# Patient Record
Sex: Female | Born: 1945 | Race: White | Hispanic: No | Marital: Married | Smoking: Never smoker
Health system: Southern US, Community
[De-identification: ages and names within clinical notes are randomized; demographics above are authoritative.]

## PROBLEM LIST (undated history)

## (undated) DIAGNOSIS — M792 Neuralgia and neuritis, unspecified: Secondary | ICD-10-CM

## (undated) DIAGNOSIS — M1990 Unspecified osteoarthritis, unspecified site: Secondary | ICD-10-CM

## (undated) DIAGNOSIS — C649 Malignant neoplasm of unspecified kidney, except renal pelvis: Secondary | ICD-10-CM

## (undated) DIAGNOSIS — K859 Acute pancreatitis without necrosis or infection, unspecified: Secondary | ICD-10-CM

## (undated) DIAGNOSIS — J45909 Unspecified asthma, uncomplicated: Secondary | ICD-10-CM

## (undated) HISTORY — DX: Neuralgia and neuritis, unspecified: M79.2

## (undated) HISTORY — PX: TONSILECTOMY, ADENOIDECTOMY, BILATERAL MYRINGOTOMY AND TUBES: SHX2538

## (undated) HISTORY — PX: HYSTERECTOMY: SHX81

## (undated) HISTORY — DX: Acute pancreatitis without necrosis or infection, unspecified: K85.90

## (undated) HISTORY — DX: Unspecified osteoarthritis, unspecified site: M19.90

## (undated) HISTORY — PX: ABDOMINAL HYSTERECTOMY: SHX81

## (undated) HISTORY — PX: CHOLECYSTECTOMY: SHX55

## (undated) HISTORY — DX: Unspecified asthma, uncomplicated: J45.909

## (undated) HISTORY — DX: Malignant neoplasm of unspecified kidney, except renal pelvis: C64.9

## (undated) HISTORY — PX: APPENDECTOMY: SHX54

---

## 1999-07-27 ENCOUNTER — Emergency Department: Admit: 1999-07-27 | Disposition: A | Discharge status: Home / Self Care | Payer: Self-pay

## 2010-02-09 ENCOUNTER — Inpatient Hospital Stay
Admission: EM | Admit: 2010-02-09 | Disposition: A | Discharge status: Home / Self Care | Payer: Self-pay | Source: Emergency Department | Admitting: Surgery

## 2010-02-12 LAB — URINALYSIS WITH MICROSCOPIC

## 2010-02-12 LAB — COMPREHENSIVE METABOLIC PANEL
ALT: 34 U/L (ref 7–56)
AST (SGOT): 40 U/L (ref 5–40)
Albumin, Synovial: 4.6 g/dL (ref 3.9–5.0)
Alkaline Phosphatase: 126 U/L (ref 38–126)
BUN / Creatinine Ratio: 16 (ref 8–20)
BUN: 11 mg/dL (ref 6–20)
Bilirubin, Total: 0.8 mg/dL (ref 0.2–1.3)
CO2: 24 mmol/L (ref 21.0–31.0)
Calcium: 9.4 mg/dL (ref 8.4–10.2)
Chloride: 98 mmol/L — ABNORMAL LOW (ref 101–111)
Creatinine: 0.71 mg/dL (ref 0.52–1.04)
EGFR: >60 mL/min/{1.73_m2}
EGFR: >60 mL/min/{1.73_m2}
Glucose: 93 mg/dL (ref 70–100)
Potassium: 5.4 mmol/L — ABNORMAL HIGH (ref 3.6–5.0)
Protein, Total: 8.2 g/dL (ref 6.3–8.2)
Sodium: 137 mmol/L (ref 135–145)

## 2010-02-12 LAB — CBC AND DIFFERENTIAL
BASOPHILS %: 0.1 % (ref 0.0–2.0)
Baso(Absolute): 0.03 10*3/uL (ref 0.00–0.20)
Eosinophils %: 0.8 % (ref 0.0–6.0)
Eosinophils Absolute: 0.17 10*3/uL (ref 0.10–0.30)
Hematocrit: 46 % (ref 27.0–49.5)
Hemoglobin: 15.5 g/dL (ref 11.7–15.5)
Immature Granulocytes #: 0.06 10*3/uL — ABNORMAL HIGH (ref 0.00–0.05)
Immature Granulocytes %: 0.3 % — ABNORMAL HIGH (ref 0.0–0.0)
Lymphocytes Absolute: 3.18 10*3/uL (ref 1.00–4.80)
Lymphocytes Relative: 15.5 % — ABNORMAL LOW (ref 25.0–55.0)
MCH: 29.6 pg (ref 27.0–34.0)
MCHC: 33.7 g/dL (ref 32.0–36.0)
MCV: 87.8 fL (ref 80–100)
MPV: 10.7 fL (ref 9.0–13.0)
Monocytes Absolute: 1.62 10*3/uL — ABNORMAL HIGH (ref 0.10–1.20)
Monocytes Relative %: 7.9 % (ref 1.0–8.0)
Neutrophils Absolute: 15.49 10*3/uL — ABNORMAL HIGH (ref 1.80–7.70)
Neutrophils Relative %: 75.7 % — ABNORMAL HIGH (ref 49.0–69.0)
Nucleated RBC %: 0 /100WBC (ref 0.0–0.0)
Nucleted RBC #: 0 10*3/uL (ref 0.00–0.00)
Platelets: 278 10*3/uL (ref 150–400)
RBC: 5.24 M/uL (ref 3.80–5.40)
RDW: 14 % (ref 11.0–14.0)
WBC: 20.49 10*3/uL — ABNORMAL HIGH (ref 4.80–10.80)

## 2010-02-12 LAB — URINALYSIS
Bilirubin, UA: NEGATIVE
Blood, UA: NEGATIVE
Glucose, UA: NEGATIVE
Nitrate: NEGATIVE
Specific Gravity, UR: 1.025 (ref 1.000–1.035)
Urobilinogen, UA: >=8
pH, Urine: 6 (ref 5.0–8.0)

## 2010-02-12 LAB — LIPASE: Lipase: 48 U/L (ref 23–300)

## 2010-02-12 LAB — AMYLASE: Amylase: 40 U/L (ref 29–110)

## 2011-03-02 NOTE — Op Note (Signed)
COSETTE, PRINDLE                                                    MRN:          [id number]                                                          Account:      [id number]                                                     Document ID:  [id number]                                               Procedure Date: 02/09/2010                                                                                    MRN: [id number]  Document ID: [id number]  Admit Date: 02/09/2010     Patient Location: DISCHARGED 02/11/2010  Patient Type: I     SURGEON: Berle Mull MD  ASSISTANT:          PREOPERATIVE DIAGNOSIS:  Acute appendicitis.     POSTOPERATIVE DIAGNOSIS:  Ruptured appendix.     TITLE OF PROCEDURE:  Laparoscopic appendectomy.     ANESTHESIA:  General.     COMPLICATIONS:  None.     BLOOD LOSS:  Minimal     DEEP VENOUS THROMBOSIS PROPHYLAXIS:  Sequential compression devices.     ANTIBIOTIC PROPHYLAXIS:  None needed as the patient was already on therapeutic dosing.     DESCRIPTION OF PROCEDURE:  The patient was taken to the operating room and given general anesthesia  and was prepped and draped in sterile fashion.  An optical 12-mm port was  placed in the umbilicus in the usual fashion and used to create a  pneumoperitoneum.  Under direct vision, two 5-mm low midline ports were  placed, and the appendix was identified and noted to be inflamed and  ruptured, leaking pus.  It was bluntly separated from the surrounding  tissue.  The mesoappendix was divided with the Harmonic scalpel, and a PDS  Endoloop was placed about the base of the appendix.  The appendix was  divided with the Harmonic scalpel and placed within a retrieval bag for  Page 1 of 2  NOU, CHARD                                                    MRN:          [id number]                                                          Account:      [id number]                                                      Document ID:  [id number]                                               Procedure Date: 02/09/2010                                                                                    later removal.  The abdomen was then irrigated out with copious amounts of  saline, suctioned dry, and inspected.  There was no bleeding or further  contamination.  All ports were then removed and the appendix pulled out  through the umbilicus in its bag.  The umbilical fascia was closed with 0  Vicryl and all skin incisions closed with 5-0 Monocryl and Steri-Strips and  injected with Marcaine.  The patient was then awakened and taken to the  recovery room in stable condition, with all counts correct and having  tolerated the procedure well.              D:  02/16/2010 13:02 PM by Berle Mull, MD [phone])  T:  02/17/2010 07:18 AM by       Everlean Cherry: [id number]) (Doc ID: [id number])        cc:                                                                                                            Page 2 of 2  Authenticated by Alric Ran, MD On 03/01/2010 08:10:23 AM

## 2011-03-02 NOTE — Consults (Signed)
Lindsay Elliott, Lindsay Elliott                                                    MRN:          [id number]                                                          Account:      [id number]                                                     Document ID:  [id number]                                               Service Date: 02/09/2010                                                                                    MRN: [id number]  Document ID: [id number]  Admit Date: 02/09/2010     Patient Location: [id number]   Patient Type: I     CONSULTING PHYSICIAN: Berle Mull MD     REFERRING PHYSICIAN: Michaelle Copas MD        REASON FOR CONSULTATION:  Evaluation of abdominal pain.     HISTORY OF PRESENT ILLNESS:  This is a 65 year old female with a 2-day history of initial onset of  nausea and vomiting and malaise, which became a lower abdominal pain  gradually settling in the right lower quadrant.  Yesterday she felt  slightly better, but this morning was feeling worse again with increased  pain.  The pain is worse with movement, associated with nausea, anorexia,  fever of over 101, gradual worsening of her symptoms during the course of  today and slightly relieved by pain medicine.  The pain is constant and  nonradiating and non-migratory.     PAST MEDICAL HISTORY:  Otherwise unremarkable.     PAST SURGICAL HISTORY:  Hysterectomy, tonsillectomy.     SOCIAL HISTORY:  She does not use tobacco or alcohol.     MEDICATIONS:  Takes Tylenol occasionally.     ALLERGIES:    None.     REVIEW OF SYSTEMS:  Negative for HEENT, cardiac, respiratory, psychiatric, neurologic,  dermatologic, musculoskeletal, hematologic, endocrinologic, or psychiatric  issues not already mentioned.  Page 1 of 2  Lindsay Elliott, Lindsay Elliott                                                    MRN:          [id number]                                                           Account:      [id number]                                                     Document ID:  [id number]                                               Service Date: 02/09/2010                                                                                    PHYSICAL EXAMINATION:  GENERAL:  This is a pleasant woman, well-developed, well-nourished,  nontoxic, no distress.  SKIN:  Warm and dry, nondiaphoretic.  HEENT:  Unremarkable.  External eye, ear, nose, mouth.  Normal lids and  conjunctivae, sclerae.  Pupils are EOMI.   NECK:  Without masses, adenopathy, thyromegaly, JVD, or bruit.  HEART:  Regular.  LUNGS:  Clear.  ABDOMEN:  Mildly obese, soft, nondistended.  No palpable masses or  organomegaly or hernias.  There is tenderness with rebound in the right  lower quadrant.  EXTREMITIES:  Without clubbing, cyanosis, or edema.  Good range of motion.   pulses in all 4.  NEUROLOGIC:  She is awake, alert, oriented x3 with normal mood and affect,   judgment, insight, concentration and memory.     DIAGNOSTIC STUDIES::  Fairly unremarkable chemistries, although the potassium is slightly up at  5.4 and chloride is 98.  White count is 20,000.  CT scan demonstrates a  distended inflamed appendix with surrounding edema.     IMPRESSION:  Acute appendicitis.     RECOMMENDATION:  Proceed with laparoscopic appendectomy.  Risks and benefits including  possibly of non-therapeutic surgery, other findings, and rupture were  discussed with the patient and her family and they are interested in  proceeding.              D:  02/09/2010 22:53 PM by Berle Mull, MD [phone])  T:  02/10/2010 06:00 AM by Minda Meo      (Conf: [id number]) (Doc ID: [id number])        cc:  Page 2 of 2  Authenticated by Alric Ran, MD On 02/16/2010 12:51:38 PM

## 2013-03-30 ENCOUNTER — Emergency Department: Payer: Medicare Other

## 2013-03-30 ENCOUNTER — Inpatient Hospital Stay: Payer: Medicare Other | Admitting: Internal Medicine

## 2013-03-30 ENCOUNTER — Inpatient Hospital Stay
Admission: EM | Admit: 2013-03-30 | Discharge: 2013-04-02 | DRG: 418 | Disposition: A | Discharge status: Home / Self Care | Payer: Medicare Other | Attending: Surgery | Admitting: Surgery

## 2013-03-30 DIAGNOSIS — K7689 Other specified diseases of liver: Secondary | ICD-10-CM | POA: Diagnosis present

## 2013-03-30 DIAGNOSIS — Z23 Encounter for immunization: Secondary | ICD-10-CM

## 2013-03-30 DIAGNOSIS — K8000 Calculus of gallbladder with acute cholecystitis without obstruction: Secondary | ICD-10-CM | POA: Diagnosis present

## 2013-03-30 DIAGNOSIS — K859 Acute pancreatitis without necrosis or infection, unspecified: Principal | ICD-10-CM | POA: Diagnosis present

## 2013-03-30 DIAGNOSIS — K821 Hydrops of gallbladder: Secondary | ICD-10-CM | POA: Diagnosis present

## 2013-03-30 LAB — COMPREHENSIVE METABOLIC PANEL
ALT: 256 U/L — ABNORMAL HIGH (ref 0–55)
AST (SGOT): 197 U/L — ABNORMAL HIGH (ref 5–34)
Albumin/Globulin Ratio: 1 (ref 0.9–2.2)
Albumin: 3.9 g/dL (ref 3.5–5.0)
Alkaline Phosphatase: 97 U/L (ref 40–150)
Anion Gap: 13 (ref 5.0–15.0)
BUN: 9.3 mg/dL (ref 7.0–19.0)
Bilirubin, Total: 0.6 mg/dL (ref 0.2–1.2)
CO2: 20 — ABNORMAL LOW (ref 22–29)
Calcium: 9.5 mg/dL (ref 8.5–10.5)
Chloride: 107 (ref 98–107)
Creatinine: 0.8 mg/dL (ref 0.6–1.0)
Globulin: 3.8 g/dL — ABNORMAL HIGH (ref 2.0–3.6)
Glucose: 115 mg/dL — ABNORMAL HIGH (ref 70–100)
Potassium: 4.3 (ref 3.5–5.1)
Protein, Total: 7.7 g/dL (ref 6.0–8.3)
Sodium: 140 (ref 136–145)

## 2013-03-30 LAB — LIPASE: Lipase: 844 U/L — ABNORMAL HIGH (ref 8–78)

## 2013-03-30 LAB — URINALYSIS
Bilirubin, UA: NEGATIVE
Blood, UA: NEGATIVE
Glucose, UA: NEGATIVE
Ketones UA: NEGATIVE
Nitrite, UA: NEGATIVE
Protein, UR: NEGATIVE
Specific Gravity UA: 1.01 (ref 1.001–1.035)
Urine pH: 6 (ref 5.0–8.0)
Urobilinogen, UA: 0.2 mg/dL (ref 0.2–2.0)

## 2013-03-30 LAB — URINE MICROSCOPIC: RBC, UA: NONE SEEN (ref 0–5)

## 2013-03-30 LAB — CBC AND DIFFERENTIAL
Basophils Absolute Automated: 0.01 (ref 0.00–0.20)
Basophils Automated: 0 %
Eosinophils Absolute Automated: 0.1 (ref 0.00–0.70)
Eosinophils Automated: 1 %
Hematocrit: 42.4 % (ref 37.0–47.0)
Hgb: 14.3 g/dL (ref 12.0–16.0)
Immature Granulocytes Absolute: 0.02
Immature Granulocytes: 0 %
Lymphocytes Absolute Automated: 2.43 (ref 0.50–4.40)
Lymphocytes Automated: 21 %
MCH: 29.9 pg (ref 28.0–32.0)
MCHC: 33.7 g/dL (ref 32.0–36.0)
MCV: 88.5 fL (ref 80.0–100.0)
MPV: 10.5 fL (ref 9.4–12.3)
Monocytes Absolute Automated: 0.65 (ref 0.00–1.20)
Monocytes: 6 %
Neutrophils Absolute: 8.41 — ABNORMAL HIGH (ref 1.80–8.10)
Neutrophils: 72 %
Platelets: 291 (ref 140–400)
RBC: 4.79 (ref 4.20–5.40)
RDW: 14 % (ref 12–15)
WBC: 11.6 — ABNORMAL HIGH (ref 3.50–10.80)

## 2013-03-30 LAB — LACTIC ACID, PLASMA: Lactic Acid: 1.4 mmol/L (ref 0.7–2.1)

## 2013-03-30 LAB — GFR: EGFR: >60

## 2013-03-30 MED ORDER — MORPHINE SULFATE 4 MG/ML IJ/IV SOLN (WRAP)
4.0000 mg | Freq: Once | Status: AC
Start: 2013-03-30 — End: 2013-03-30
  Administered 2013-03-30: 4 mg via INTRAVENOUS
  Filled 2013-03-30: qty 1

## 2013-03-30 MED ORDER — IOHEXOL 350 MG/ML IV SOLN
100.0000 mL | Freq: Once | INTRAVENOUS | Status: DC | PRN
Start: 2013-03-30 — End: 2013-03-31

## 2013-03-30 MED ORDER — IOHEXOL 240 MG/ML IJ SOLN
50.0000 mL | Freq: Once | INTRAMUSCULAR | Status: AC
Start: 2013-03-30 — End: 2013-03-30
  Administered 2013-03-30: 50 mL via ORAL

## 2013-03-30 MED ORDER — ONDANSETRON HCL 4 MG/2ML IJ SOLN
4.0000 mg | Freq: Once | INTRAMUSCULAR | Status: AC
Start: 2013-03-30 — End: 2013-03-30
  Administered 2013-03-30: 4 mg via INTRAVENOUS
  Filled 2013-03-30: qty 2

## 2013-03-30 MED ORDER — SODIUM CHLORIDE 0.9 % IV BOLUS
500.0000 mL | Freq: Once | INTRAVENOUS | Status: AC
Start: 2013-03-30 — End: 2013-03-30
  Administered 2013-03-30: 500 mL via INTRAVENOUS

## 2013-03-30 MED ORDER — SODIUM CHLORIDE 0.9 % IV MBP
1.0000 g | Freq: Once | INTRAVENOUS | Status: AC
Start: 2013-03-30 — End: 2013-03-31
  Administered 2013-03-30: 1 g via INTRAVENOUS
  Filled 2013-03-30: qty 1000

## 2013-03-30 NOTE — ED Provider Notes (Signed)
Physician/Midlevel provider first contact with patient: 03/30/13 2026         History     Chief Complaint   Patient presents with   . Abdominal Pain   . Emesis     HPI Comments: Pt with hx of gallstone and appendectomy previously now with nausea and vomiting Q 1 hour since this morning, now with epigastric pain as well.  Pt answers that there are no fevers, chills, diarrhea, constipation, trauma or rash.     no chest pain or sob;      Patient is a 67 y.o. female presenting with abdominal pain and vomiting. The history is provided by the patient.   Abdominal Pain  The primary symptoms of the illness include abdominal pain, nausea and vomiting. The primary symptoms of the illness do not include fever, fatigue, shortness of breath, diarrhea, hematemesis, hematochezia, dysuria, vaginal discharge or vaginal bleeding. The current episode started 6 to 12 hours ago. The onset of the illness was sudden. The problem has not changed since onset.  Symptoms associated with the illness do not include chills, anorexia, diaphoresis, heartburn, constipation, urgency, hematuria, frequency or back pain. Significant associated medical issues include gallstones. Significant associated medical issues do not include PUD, GERD, inflammatory bowel disease, diabetes, diverticulitis or cardiac disease.   Emesis   Associated symptoms include abdominal pain. Pertinent negatives include no arthralgias, no chills, no cough, no diarrhea, no fever and no headaches.       Past Medical History   Diagnosis Date   . Nerve pain      feet, behind eye       Past Surgical History   Procedure Date   . Appendectomy    . Hysterectomy    . Tonsilectomy, adenoidectomy, bilateral myringotomy and tubes    . Laparoscopic, cholecystectomy 04/01/2013     Procedure: LAPAROSCOPIC, CHOLECYSTECTOMY;  Surgeon: Jerel Shepherd, MD;  Location:  MAIN OR;  Service: General;  Laterality: N/A;       History reviewed. No pertinent family history.    Social  History     Substance Use Topics   . Smoking status: Never Smoker    . Smokeless tobacco: Not on file   . Alcohol Use: No       .     No Known Allergies    Discharge Medication List as of 04/02/2013  4:52 PM      CONTINUE these medications which have NOT CHANGED    Details   Cholecalciferol (VITAMIN D) 1000 UNITS capsule Take 1,000 Units by mouth once a week., Until Discontinued, Historical Med      Vitamin B1-B12 5.5-0.0125 MG/5ML Solution Inject into the muscle every 30 (thirty) days., Until Discontinued, Historical Med              Review of Systems   Constitutional: Negative for fever, chills, diaphoresis, activity change and fatigue.   HENT: Negative for ear pain, nosebleeds, rhinorrhea and sore throat.    Eyes: Negative for pain and visual disturbance.   Respiratory: Negative for cough, chest tightness, shortness of breath, wheezing and stridor.    Cardiovascular: Negative for chest pain, palpitations and leg swelling.   Gastrointestinal: Positive for nausea, vomiting and abdominal pain. Negative for heartburn, diarrhea, constipation, hematochezia, abdominal distention, anorexia and hematemesis.   Genitourinary: Negative for dysuria, urgency, frequency, hematuria, flank pain, vaginal bleeding, vaginal discharge, difficulty urinating and pelvic pain.   Musculoskeletal: Negative for arthralgias, back pain, gait problem, joint swelling, neck pain and neck  stiffness.   Skin: Negative for color change, pallor, rash and wound.   Neurological: Negative for dizziness, tremors, seizures, syncope, speech difficulty, weakness, light-headedness and headaches.   Hematological: Does not bruise/bleed easily.   Psychiatric/Behavioral: Negative for confusion.       Physical Exam    BP 126/61  Pulse 71  Temp 99 F (37.2 C) (Temporal Artery)  Resp 18  Ht 1.6 m  Wt 86.637 kg  BMI 33.84 kg/m2  SpO2 93%    Physical Exam   Constitutional: She is oriented to person, place, and time. She appears well-developed and well-nourished. No  distress.   HENT:   Head: Normocephalic and atraumatic. Head is without raccoon's eyes, without Battle's sign, without abrasion and without contusion.   Right Ear: External ear normal.   Left Ear: External ear normal.   Nose: Nose normal.   Mouth/Throat: Oropharynx is clear and moist. No oropharyngeal exudate.   Eyes: Conjunctivae normal and EOM are normal. Pupils are equal, round, and reactive to light. Right eye exhibits no discharge. Left eye exhibits no discharge. No scleral icterus.   Neck: Normal range of motion. Neck supple. Carotid bruit is not present. No rigidity. No tracheal deviation present.   Cardiovascular: Normal rate, regular rhythm, normal heart sounds and intact distal pulses.  Exam reveals no gallop and no friction rub.    No murmur heard.  Pulmonary/Chest: Effort normal and breath sounds normal. No stridor. No respiratory distress. She has no wheezes. She has no rales. She exhibits no tenderness.   Abdominal: Soft. Bowel sounds are normal. She exhibits no distension and no mass. There is tenderness in the epigastric area. There is no rebound, no guarding and no CVA tenderness.       Musculoskeletal: Normal range of motion. She exhibits no edema and no tenderness.   Neurological: She is alert and oriented to person, place, and time. She displays normal reflexes. No cranial nerve deficit or sensory deficit. She exhibits normal muscle tone. She displays no seizure activity. Coordination and gait normal. GCS eye subscore is 4. GCS verbal subscore is 5. GCS motor subscore is 6.   Skin: No abrasion, no bruising and no rash noted. She is not diaphoretic.   Psychiatric: She has a normal mood and affect. Her behavior is normal. Judgment and thought content normal.       MDM and ED Course     ED Medication Orders      Start     Status Ordering Provider    04/01/13 1003   ertapenem [facility]) 1 G in sodium chloride 0.9% 50 mL IVPB mini-bag plus      Comments: Created by cabinet override        Last MAR  action:  Given     03/30/13 2319   ertapenem (INVanz) 1 g in sodium chloride 0.9 % 50 mL IVPB mini-bag plus   Once      Route: Intravenous  Ordered Dose: 1 g         Last MAR action:  Given Ferdinand Cava    03/30/13 2145   sodium chloride 0.9 % bolus 500 mL   Once      Route: Intravenous  Ordered Dose: 500 mL         Last MAR action:  Stopped Elaisha Zahniser JAMES    03/30/13 2037   ondansetron (ZOFRAN) injection 4 mg   Once      Route: Intravenous  Ordered Dose: 4 mg  Last MAR action:  Given Ferdinand Cava    03/30/13 2036   iohexol (OMNIPAQUE) 240 MG/ML IV/ORAL solution 50 mL   Once      Route: Oral  Ordered Dose: 50 mL         Last MAR action:  Given Ferdinand Cava    03/30/13 2036   morphine injection 4 mg   Once      Route: Intravenous  Ordered Dose: 4 mg         Last MAR action:  Given Durante Violett JAMES    03/30/13 2029   sodium chloride 0.9 % bolus 500 mL   Once      Route: Intravenous  Ordered Dose: 500 mL         Last MAR action:  Stopped Chyanne Kohut, Caryl Pina                 MDM  Number of Diagnoses or Management Options  Diagnosis management comments: I, Ferdinand Cava, MD, have been the primary provider for [facility]  during this Emergency Dept visit.    Oxygen saturation by pulse oximetry is 95%-100%, Normal, none needed.    DDx:  VIRAL GASTRO (although no diarrhea yet), partial obstruction vs cholecystitis vs gastrtitis vs other.                      Amount and/or Complexity of Data Reviewed  Clinical lab tests: ordered  Tests in the radiology section of CPT: ordered    Risk of Complications, Morbidity, and/or Mortality  Presenting problems: moderate  Diagnostic procedures: high          Procedures  Results     ** No Results found for the last 24 hours. **          Clinical Impression & Disposition     Clinical Impression  Final diagnoses:   Pancreatitis        ED Disposition     Admit Bed Type: General [8]  Admitting Physician: Dorthula Nettles  [phone]  Patient Class: Inpatient [101]             Discharge Medication List as of 04/02/2013  4:52 PM      START taking these medications    Details   acetaminophen (TYLENOL) 500 MG tablet Take 1 tablet (500 mg total) by mouth every 4 (four) hours as needed., Starting 04/02/2013, Until Discontinued, No Print      HYDROcodone-acetaminophen (NORCO) 5-325 MG per tablet Take 1 tablet by mouth every 4 (four) hours as needed., Starting 04/02/2013, Until Discontinued, Print      ibuprofen (ADVIL,MOTRIN) 400 MG tablet Take 1 tablet (400 mg total) by mouth three times a day after meals., Starting 04/02/2013, Until Discontinued, No Print      polyethylene glycol (MIRALAX) packet Take 17 g by mouth daily., Starting 04/02/2013, Until Discontinued, No Print                     Ferdinand Cava, MD  04/03/13 [phone]

## 2013-03-30 NOTE — ED Notes (Signed)
Per MD- hold bringing pt to CT until results from U/S are back.

## 2013-03-30 NOTE — ED Notes (Signed)
Pt complains of upper abdominal pain that is worse in RUQ of abdomen. Patient states she has been vomiting since 0630 this morning. Patient states she has not had bowel movement today which is abnormal for her. Patient denies urinary complaints and diarrhea. Pt denies recent travel outside of [place] in the past 21 days.

## 2013-03-31 DIAGNOSIS — K859 Acute pancreatitis without necrosis or infection, unspecified: Principal | ICD-10-CM | POA: Diagnosis present

## 2013-03-31 DIAGNOSIS — K851 Biliary acute pancreatitis without necrosis or infection: Secondary | ICD-10-CM

## 2013-03-31 HISTORY — DX: Biliary acute pancreatitis without necrosis or infection: K85.10

## 2013-03-31 LAB — CBC AND DIFFERENTIAL
Basophils Absolute Automated: 0.02 (ref 0.00–0.20)
Basophils Automated: 0 %
Eosinophils Absolute Automated: 0.23 (ref 0.00–0.70)
Eosinophils Automated: 2 %
Hematocrit: 39.5 % (ref 37.0–47.0)
Hgb: 12.9 g/dL (ref 12.0–16.0)
Immature Granulocytes Absolute: 0.02
Immature Granulocytes: 0 %
Lymphocytes Absolute Automated: 2.55 (ref 0.50–4.40)
Lymphocytes Automated: 27 %
MCH: 29.3 pg (ref 28.0–32.0)
MCHC: 32.7 g/dL (ref 32.0–36.0)
MCV: 89.6 fL (ref 80.0–100.0)
MPV: 10.6 fL (ref 9.4–12.3)
Monocytes Absolute Automated: 0.61 (ref 0.00–1.20)
Monocytes: 6 %
Neutrophils Absolute: 6.09 (ref 1.80–8.10)
Neutrophils: 64 %
Platelets: 259 (ref 140–400)
RBC: 4.41 (ref 4.20–5.40)
RDW: 14 % (ref 12–15)
WBC: 9.5 (ref 3.50–10.80)

## 2013-03-31 LAB — COMPREHENSIVE METABOLIC PANEL
ALT: 198 U/L — ABNORMAL HIGH (ref 0–55)
AST (SGOT): 130 U/L — ABNORMAL HIGH (ref 5–34)
Albumin/Globulin Ratio: 1 (ref 0.9–2.2)
Albumin: 3.2 g/dL — ABNORMAL LOW (ref 3.5–5.0)
Alkaline Phosphatase: 86 U/L (ref 40–150)
Anion Gap: 8 (ref 5.0–15.0)
BUN: 6.9 mg/dL — ABNORMAL LOW (ref 7.0–19.0)
Bilirubin, Total: 0.5 mg/dL (ref 0.2–1.2)
CO2: 24 (ref 22–29)
Calcium: 8.6 mg/dL (ref 8.5–10.5)
Chloride: 109 — ABNORMAL HIGH (ref 98–107)
Creatinine: 0.8 mg/dL (ref 0.6–1.0)
Globulin: 3.2 g/dL (ref 2.0–3.6)
Glucose: 103 mg/dL — ABNORMAL HIGH (ref 70–100)
Potassium: 4.1 (ref 3.5–5.1)
Protein, Total: 6.4 g/dL (ref 6.0–8.3)
Sodium: 141 (ref 136–145)

## 2013-03-31 LAB — LIPID PANEL
Cholesterol / HDL Ratio: 4.2
Cholesterol: 159 mg/dL (ref 0–199)
HDL: 38 mg/dL — ABNORMAL LOW (ref >40)
LDL Calculated: 102 mg/dL — ABNORMAL HIGH (ref 0–99)
Triglycerides: 94 mg/dL (ref 34–149)
VLDL Calculated: 19 mg/dL (ref 10–40)

## 2013-03-31 LAB — MAGNESIUM: Magnesium: 1.7 mg/dL (ref 1.6–2.6)

## 2013-03-31 LAB — HEMOLYSIS INDEX: Hemolysis Index: 7 (ref 0–9)

## 2013-03-31 LAB — GFR: EGFR: >60

## 2013-03-31 MED ORDER — MORPHINE SULFATE 2 MG/ML IJ/IV SOLN (WRAP)
2.0000 mg | Status: DC | PRN
Start: 2013-03-31 — End: 2013-03-31
  Administered 2013-03-31: 2 mg via INTRAVENOUS
  Filled 2013-03-31: qty 1

## 2013-03-31 MED ORDER — INFLUENZA VIRUS VACCINE SPLIT IM SUSP
0.5000 mL | Freq: Once | INTRAMUSCULAR | Status: AC
Start: 2013-03-31 — End: 2013-03-31
  Administered 2013-03-31: 0.5 mL via INTRAMUSCULAR
  Filled 2013-03-31: qty 5

## 2013-03-31 MED ORDER — HYDROMORPHONE HCL PF 1 MG/ML IJ SOLN
0.5000 mg | INTRAMUSCULAR | Status: DC | PRN
Start: 2013-03-31 — End: 2013-04-02
  Administered 2013-04-01 – 2013-04-02 (×4): 0.5 mg via INTRAVENOUS
  Filled 2013-03-31 (×4): qty 1

## 2013-03-31 MED ORDER — ENOXAPARIN SODIUM 40 MG/0.4ML SC SOLN
40.0000 mg | Freq: Every day | SUBCUTANEOUS | Status: DC
Start: 2013-03-31 — End: 2013-04-02
  Administered 2013-03-31 – 2013-04-02 (×2): 40 mg via SUBCUTANEOUS
  Filled 2013-03-31 (×2): qty 0.4

## 2013-03-31 MED ORDER — SODIUM CHLORIDE 0.9 % IV SOLN
INTRAVENOUS | Status: DC
Start: 2013-03-31 — End: 2013-04-02

## 2013-03-31 MED ORDER — ACETAMINOPHEN 500 MG PO TABS
500.0000 mg | ORAL_TABLET | ORAL | Status: DC | PRN
Start: 2013-03-31 — End: 2013-04-02
  Administered 2013-03-31 (×2): 500 mg via ORAL
  Filled 2013-03-31 (×2): qty 1

## 2013-03-31 MED ORDER — ONDANSETRON HCL 4 MG/2ML IJ SOLN
4.0000 mg | Freq: Four times a day (QID) | INTRAMUSCULAR | Status: DC | PRN
Start: 2013-03-31 — End: 2013-04-02
  Administered 2013-03-31 – 2013-04-01 (×2): 4 mg via INTRAVENOUS
  Filled 2013-03-31 (×2): qty 2

## 2013-03-31 MED ORDER — FAMOTIDINE 10 MG/ML IV SOLN (WRAP)
20.0000 mg | Freq: Two times a day (BID) | INTRAVENOUS | Status: DC
Start: 2013-03-31 — End: 2013-04-02
  Administered 2013-03-31 – 2013-04-02 (×4): 20 mg via INTRAVENOUS
  Filled 2013-03-31 (×4): qty 2

## 2013-03-31 MED ORDER — IBUPROFEN 400 MG PO TABS
400.0000 mg | ORAL_TABLET | Freq: Four times a day (QID) | ORAL | Status: DC | PRN
Start: 2013-03-31 — End: 2013-03-31
  Administered 2013-03-31: 400 mg via ORAL
  Filled 2013-03-31: qty 1

## 2013-03-31 NOTE — Plan of Care (Signed)
Problem: Health Promotion  Goal: Vaccination Screening  All patients will be screened for current vaccination status on each admission.   Outcome: Completed Date Met:  03/31/13  Patient refused PNA vaccine and has received the flu vaccine by am nurse.  Goal: Risk control - tobacco abuse  Actions to eliminate or reduce tobacco use.   Outcome: Completed Date Met:  03/31/13  Patient is a non-smoker    Problem: Safety  Goal: Patient will be free from injury during hospitalization  Outcome: Progressing  Reviewed fall safety plan. Performing hourly rounding. Call bell within reach.    Problem: Pain  Goal: Patient's pain/discomfort is manageable  Outcome: Progressing  Intervention: Include patient/family/caregiver in decisions related to pain management  Prn pain medication is available upon request.      Problem: Psychosocial and Spiritual Needs  Goal: Demonstrates ability to cope with hospitalization/illness  Outcome: Progressing  Providing a quiet sleep environment.

## 2013-03-31 NOTE — H&P (Signed)
ADMISSION HISTORY AND PHYSICAL EXAM    Date Time: 03/31/2013 4:13 AM  Patient Name: Lindsay Elliott  Attending Physician: Dorthula Nettles, MD    Chief Complaint:   Abdominal pain    History of Present Illness:   Lindsay Elliott is a 67 y.o. female who presents to the hospital with abdominal pain since yesterday morning. Abdominal pain starts in the RUQ area then radiates to epigastric area, sharp, continuous, non accompanied w/ nausea, vomiting, headache. Pt has had 2-3 episodes of nausea and vomiting episode 6 hr after eating for the past 1 month. Pt denies drinking any alcohol prior. Pt denies any recent changes in her medications. Pt denies lightheadedness, fever, chills, chest pain, SOB, cough, diarrhea, blood in stool, melena, urinary symptoms, numbness, weakness.     Past Medical History:     Past Medical History   Diagnosis Date   . Nerve pain      feet, behind eye       Past Surgical History:     Past Surgical History   Procedure Date   . Appendectomy    . Hysterectomy    . Tonsilectomy, adenoidectomy, bilateral myringotomy and tubes        Medications:     Current/Home Medications    CHOLECALCIFEROL (VITAMIN D) 1000 UNITS CAPSULE    Take 1,000 Units by mouth once a week.    VITAMIN B1-B12 5.5-0.0125 MG/5ML SOLUTION    Inject into the muscle every 30 (thirty) days.       Allergies:   No Known Allergies    Social History:     History     Social History   . Marital Status: Married     Spouse Name: N/A     Number of Children: N/A   . Years of Education: N/A     Social History Main Topics   . Smoking status: Never Smoker    . Smokeless tobacco: Not on file   . Alcohol Use: No   . Drug Use: No   . Sexually Active: Not on file     Other Topics Concern   . Not on file     Social History Narrative   . No narrative on file       Family History:   Denies FH of CVA or CAD.     Review of Systems:   Constitutional: Negative for fever, chills, malaise/fatigue and diaphoresis.   HENT: Negative for ear pain,  congestion, sore throat and neck pain.    Eyes: Negative for blurred vision.   Respiratory: Negative for cough, hemoptysis, sputum production, shortness of breath, wheezing and stridor.    Cardiovascular: Negative for chest pain, palpitations, orthopnea, claudication, leg swelling and PND.   Gastrointestinal: Negative for heartburn, nausea, vomiting, abdominal pain, diarrhea, constipation, blood in stool and melena.   Genitourinary: Negative for dysuria, urgency, frequency, hematuria and flank pain.   Musculoskeletal: Negative for back pain and falls.   Skin: Negative for rash.   Neurological: Negative for dizziness, tingling, tremors, sensory change, speech change, focal weakness, seizures, loss of consciousness, weakness and headaches.   Psychiatric/Behavioral: Negative for depression and memory loss. The patient is not nervous/anxious.      Physical Exam:     Filed Vitals:    03/31/13 0020   BP: 163/71   Pulse: 69   Temp: 99 F (37.2 C)   Resp: 16   SpO2: 97%       Constitutional: she is oriented to person, place,  and time. she appears well-developed and well-nourished. No distress.   HEENT: Normocephalic and atraumatic. Conjunctivae are normal. Pupils are equal, round, and reactive to light. No scleral icterus.   Neck: No JVD present. No tracheal deviation present. No thyromegaly present.   Cardiovascular: Normal rate, regular rhythm, normal heart sounds and intact distal pulses.  Exam reveals no gallop. No murmur heard.  Pulmonary/Chest: Effort normal and breath sounds normal. No stridor. No respiratory distress. she has no wheezes. she has no rales. she exhibits no tenderness.   Abdominal: Soft. Bowel sounds are normal. she exhibits no distension and no mass. There is epigastric tenderness. There is no rebound and no guarding.   Musculoskeletal: Normal range of motion. she exhibits no edema.   Lymphadenopathy: she has no cervical adenopathy.   Neurological: she is alert and oriented to person, place, and  time. No cranial nerve deficit. she exhibits no motoric or sensory deficit.   Skin: Skin is warm. No rash noted. she is not diaphoretic.   Psychiatric: she has a normal mood and affect. Thought content normal.       Labs:     Results     Procedure Component Value Units Date/Time    Urinalysis [[id number]]  (Abnormal) Collected:03/30/13 2222    Specimen Information:Urine Updated:03/30/13 2239     Urine Type Clean Catch      Color, UA YELLOW      Clarity, UA SLIGHTLY CLOUDY      Specific Gravity UA 1.010      Urine pH 6.0      Leukocyte Esterase, UA TRACE (A)      Nitrite, UA NEGATIVE      Protein, UR NEGATIVE      Glucose, UA NEGATIVE      Ketones UA NEGATIVE      Urobilinogen, UA 0.2 mg/dL      Bilirubin, UA NEGATIVE      Blood, UA NEGATIVE     Microscopic, Urine [[id number]]  (Abnormal) Collected:03/30/13 2222     RBC, UA None Seen Updated:03/30/13 2239     WBC, UA 11 - 25 (A)      Squamous Epithelial Cells, Urine 0 - 5      Urine Bacteria Few (A)     Comprehensive metabolic panel [[id number]]  (Abnormal) Collected:03/30/13 2039    Specimen Information:Blood Updated:03/30/13 2106     Glucose 115 (H) mg/dL      BUN 9.3 mg/dL      Creatinine 0.8 mg/dL      Sodium 295      Potassium 4.3      Chloride 107      CO2 20 (L)      CALCIUM 9.5 mg/dL      Protein, Total 7.7 g/dL      Albumin 3.9 g/dL      AST (SGOT) 284 (H) U/L      ALT 256 (H) U/L      Alkaline Phosphatase 97 U/L      Bilirubin, Total 0.6 mg/dL      Globulin 3.8 (H) g/dL      Albumin/Globulin Ratio 1.0      Anion Gap 13.0     Lipase [[id number]]  (Abnormal) Collected:03/30/13 2039    Specimen Information:Blood Updated:03/30/13 2106     Lipase 844 (H) U/L     GFR [[id number]] Collected:03/30/13 2039     EGFR >60.0 Updated:03/30/13 2106    Lactic Acid [[id number]] Collected:03/30/13 2039    Specimen Information:Blood Updated:03/30/13 2105  Lactic acid 1.4 mmol/L     CBC with differential [[id number]]  (Abnormal) Collected:03/30/13 2039    Specimen Information:Blood /  Blood Updated:03/30/13 2047     WBC 11.60 (H)      RBC 4.79      Hgb 14.3 g/dL      Hematocrit 54.0 %      MCV 88.5 fL      MCH 29.9 pg      MCHC 33.7 g/dL      RDW 14 %      Platelets 291      MPV 10.5 fL      Neutrophils 72 %      Lymphocytes Automated 21 %      Monocytes 6 %      Eosinophils Automated 1 %      Basophils Automated 0 %      Immature Granulocyte 0 %      Neutrophils Absolute 8.41 (H)      Abs Lymph Automated 2.43      Abs Mono Automated 0.65      Abs Eos Automated 0.10      Absolute Baso Automated 0.01      Absolute Immature Granulocyte 0.02             Rads:     Radiology Results (24 Hour)     Procedure Component Value Units Date/Time    CT Abdomen Pelvis W IV And PO Cont [[id number]] Collected:03/30/13 2307    Order Status:Completed  Updated:03/30/13 2316    Narrative:    Indication: Right upper quadrant pain.    Procedure: Contrast-enhanced CT of the abdomen and pelvis. 100 mL of  Omnipaque 350 were administered intravenously. Oral contrast material is  also present. Axial images were acquired using helical technique.    Comparison: Previous CT February 09, 2010.    Findings: Some calcification is seen in the gallbladder close to the  gallbladder neck, presumably in a gallstone. This was present on  previous CT in 2011. There is no gallbladder wall thickening or  pericholecystic stranding. No dilatation of the biliary tree.  Unremarkable liver, spleen, pancreas, and adrenals. A small  circumscribed lesion seen posteriorly in the mid left kidney is likely a  benign cyst and was present on previous CT. No hydronephrosis. No  urinary tract calculi are seen. There are small amounts of calcified  atherosclerotic plaque. No aneurysm of the abdominal aorta. The appendix  is absent. The uterus is absent. No adnexal masses or cystic lesions are  seen. No colonic diverticula are identified. No evidence of  diverticulitis. No free intraperitoneal gas or free fluid. No dilated  bowel loops or bowel wall  thickening. Normal cardiac size. Included  portions of the lungs are unremarkable.      Impression:    Impression: Cholelithiasis. No acute abnormal findings are identified.    Wynema Birch, MD   03/30/2013 11:12 PM    US Abdomen Limited Ruq [[id number]] Collected:03/30/13 2207    Order Status:Completed  Updated:03/30/13 2221    Narrative:    HISTORY: Right upper quadrant pain, nausea, abdominal liver function  test and elevated lipase.    COMPARISON: CT examination of the abdomen and pelvis dated 02/09/2010.     FINDINGS:  Transverse and sagittal images of the abdomen.    The partially visualized abdominal aorta, IVC, and pancreatic body are  unremarkable. There is mild fatty infiltration of the liver without  apparent focal hepatic lesion. The gallbladder is not  significantly  distended. There is a 1.7 cm echogenic focus with posterior shadowing at  the neck of the gallbladder. No significant gallbladder wall thickening.  No pericholecystic fluid collection is seen. The common bile duct  measures 6.9 mm in its proximal portion.    The right kidney measures 10.3 cm in length. No hydronephrosis  identified. No ascites or sonographic Murphy sign.      Impression:      1.  Cholelithiasis, likely chronic as previously seen on CT dated  02/09/2010. No obvious sonographic evidence of acute cholecystitis  however. If clinical suspicion for acute cholecystitis remains high, a  HIDA scan can be obtained.    Candi Leash, MD   03/30/2013 10:17 PM          Assessment/Plan:   1. Acute pancreatitis w/ cholelithiasis and elevated LFT: keep NPO, IVF hydration, pain control prn, check FLP, repeat labs in AM, possibly due to cholelithiasis, GI consult in AM  2. Leukocytosis: reactive most likely, recheck labs in AM  3. DVT proph: SCD     Signed by: Joaovictor Krone, MD  11/2/20144:13 AM

## 2013-03-31 NOTE — Plan of Care (Signed)
Patient doing ok this am. Pt states pain and nausea improved and denies need for intervention at this time. Pt comfortable resting in bed. Gave pt information on influenza vaccine - pt wants to read over and think about before consenting. Pt started on small sips of clear liquids. IVF infusing. Surgical consult to Dr. Reginia Naas by Dr. Doyne Keel done. Pt steady on her feet & ambulating to bthrm & in room. Will continue to monitor.

## 2013-03-31 NOTE — Plan of Care (Signed)
Problem: Pain  Goal: Patient's pain/discomfort is manageable  Outcome: Progressing  Pain Management Plan    Education about your Pain Management.    Dear Lindsay Elliott,    It is my pleasure to care for you during your hospitalization here at [facility]. We have initiated a new program to educate our patients and/or your family members about your pain management plan.    At [facility], we are committed to providing patients the best patient care possible.  We are seeking every opportunity to meet your unique needs.     Pain is the most common medical problem that requires urgent attention.We understand how difficult it is to have pain and will work with you and your physicians to develop an effective pain management solution.    You are experiencing pain due to your illness  and our goal is to control your pain to a tolerable level where you are able to continue self-care.    This is what you have identified that helps you manage pain at home:  none    We will administer medications to control your pain as prescribed by your provider.  We will offer to provide other comfort measures as needed. We will follow up frequently to assess your pain and the effectiveness of your  pain management plan. We will communicate with your Physicians frequently to change the medications if needed.    Thank you for your time.    Valente David, RN  03/31/2013  1:39 AM  [facility]  [address]  [place], [place]  [address]

## 2013-03-31 NOTE — Consults (Signed)
CONSULTATION    Date Time: 03/31/2013 2:58 PM  Patient Name: Lindsay Elliott, Lindsay Elliott  Requesting Physician: Christel Mormon, MD      Reason for Consultation:   Gallstones, pancreatitis    Assessment:   Cholelithiasis, acute cholecystitis, mild elevation of lipase may be from emesis rather than significant pancreatitis as CT showed normal pancreas without inflammation and the pt's stone is large--1.7cm.    Plan:   Supportive care/pain meds/anti-emetics  Pt eager to proceed with lap chole once lipase is documented as improving  We reviewed the typical procedure=discussed benefits/alternatives/risks  We will check labs in am--pt is on add-on list for lap chole tomorrow.    History:   Amar Keenum is a 67 y.o. female who presents to the hospital on 03/30/2013 with sudden onset of abdominal pain, nausea and intractable emesis which led to ER evaluation.  She has known gallstone for the past 20 yrs which was minimally symptomatic.  No prior pancreatitis or severe pain like this in the past.  She denies fever/chills/sweats/jaundice/wt loss or bowel changes.       Past Medical History:     Past Medical History   Diagnosis Date   . Nerve pain      feet, behind eye       Past Surgical History:     Past Surgical History   Procedure Date   . Appendectomy    . Hysterectomy    . Tonsilectomy, adenoidectomy, bilateral myringotomy and tubes        Family History:   History reviewed. No pertinent family history.    Social History:     History     Social History   . Marital Status: Married     Spouse Name: N/A     Number of Children: N/A   . Years of Education: N/A     Social History Main Topics   . Smoking status: Never Smoker    . Smokeless tobacco: Not on file   . Alcohol Use: No   . Drug Use: No   . Sexually Active: Not on file     Other Topics Concern   . Not on file     Social History Narrative   . No narrative on file       Allergies:   No Known Allergies    Medications:     Current Facility-Administered Medications    Medication Dose Route Frequency   . enoxaparin  40 mg Subcutaneous Daily   . [COMPLETED] ertapenem  1 g Intravenous Once   . famotidine  20 mg Intravenous Q12H SCH   . influenza  0.5 mL Intramuscular Once   . [COMPLETED] iohexol  50 mL Oral Once   . [COMPLETED] morphine  4 mg Intravenous Once   . [COMPLETED] ondansetron  4 mg Intravenous Once   . [COMPLETED] sodium chloride  500 mL Intravenous Once   . [COMPLETED] sodium chloride  500 mL Intravenous Once       Review of Systems:   A comprehensive review of systems was: Negative except see HPI    Physical Exam:     Filed Vitals:    03/31/13 1332   BP: 117/56   Pulse: 54   Temp: 97.7 F (36.5 C)   Resp: 16   SpO2: 95%       Intake and Output Summary (Last 24 hours) at Date Time    Intake/Output Summary (Last 24 hours) at 03/31/13 1458  Last data filed at 03/31/13 1259  Gross per 24 hour   Intake  737.5 ml   Output    920 ml   Net -182.5 ml       General appearance - alert, well appearing, and in mild distress  Mental status - alert, oriented to person, place, and time  Eyes - pupils equal and reactive, extraocular eye movements intact  Neck - supple, no significant adenopathy  Chest - clear to auscultation  Heart - normal rate, regular rhythm  Abdomen-  Moderate tenderness in epigastrium and RUQ, +Murphy's, no mass, no peritoneal signs.  Neurological - alert, oriented, normal speech, no focal findings or movement disorder noted  Musculoskeletal - no joint tenderness, deformity or swelling     Labs Reviewed:     Results     Procedure Component Value Units Date/Time    Lipid panel [[id number]]  (Abnormal) Collected:03/31/13 0626    Specimen Information:Blood Updated:03/31/13 1139     Cholesterol 159 mg/dL      Triglycerides 94 mg/dL      HDL 38 (L) mg/dL      LDL Calculated 409 (H) mg/dL      VLDL Cholesterol Cal 19 mg/dL      CHOL/HDL Ratio 4.2     Hemolysis index [[id number]] Collected:03/31/13 0626     Hemolysis Index 7 Updated:03/31/13 1139    Magnesium  [[id number]] Collected:03/31/13 0626    Specimen Information:Blood Updated:03/31/13 0716     Magnesium 1.7 mg/dL     Comprehensive metabolic panel [[id number]]  (Abnormal) Collected:03/31/13 0626    Specimen Information:Blood Updated:03/31/13 0716     Glucose 103 (H) mg/dL      BUN 6.9 (L) mg/dL      Creatinine 0.8 mg/dL      Sodium 811      Potassium 4.1      Chloride 109 (H)      CO2 24      CALCIUM 8.6 mg/dL      Protein, Total 6.4 g/dL      Albumin 3.2 (L) g/dL      AST (SGOT) 914 (H) U/L      ALT 198 (H) U/L      Alkaline Phosphatase 86 U/L      Bilirubin, Total 0.5 mg/dL      Globulin 3.2 g/dL      Albumin/Globulin Ratio 1.0      Anion Gap 8.0     GFR [[id number]] Collected:03/31/13 0626     EGFR >60.0 Updated:03/31/13 0716    CBC and differential [[id number]] Collected:03/31/13 0626    Specimen Information:Blood / Blood Updated:03/31/13 0652     WBC 9.50      RBC 4.41      Hgb 12.9 g/dL      Hematocrit 08.6 %      MCV 89.6 fL      MCH 29.3 pg      MCHC 32.7 g/dL      RDW 14 %      Platelets 259      MPV 10.6 fL      Neutrophils 64 %      Lymphocytes Automated 27 %      Monocytes 6 %      Eosinophils Automated 2 %      Basophils Automated 0 %      Immature Granulocyte 0 %      Neutrophils Absolute 6.09      Abs Lymph Automated 2.55      Abs Mono Automated 0.61  Abs Eos Automated 0.23      Absolute Baso Automated 0.02      Absolute Immature Granulocyte 0.02     Urinalysis [[id number]]  (Abnormal) Collected:03/30/13 2222    Specimen Information:Urine Updated:03/30/13 2239     Urine Type Clean Catch      Color, UA YELLOW      Clarity, UA SLIGHTLY CLOUDY      Specific Gravity UA 1.010      Urine pH 6.0      Leukocyte Esterase, UA TRACE (A)      Nitrite, UA NEGATIVE      Protein, UR NEGATIVE      Glucose, UA NEGATIVE      Ketones UA NEGATIVE      Urobilinogen, UA 0.2 mg/dL      Bilirubin, UA NEGATIVE      Blood, UA NEGATIVE     Microscopic, Urine [[id number]]  (Abnormal) Collected:03/30/13 2222     RBC, UA None Seen  Updated:03/30/13 2239     WBC, UA 11 - 25 (A)      Squamous Epithelial Cells, Urine 0 - 5      Urine Bacteria Few (A)     Comprehensive metabolic panel [[id number]]  (Abnormal) Collected:03/30/13 2039    Specimen Information:Blood Updated:03/30/13 2106     Glucose 115 (H) mg/dL      BUN 9.3 mg/dL      Creatinine 0.8 mg/dL      Sodium 213      Potassium 4.3      Chloride 107      CO2 20 (L)      CALCIUM 9.5 mg/dL      Protein, Total 7.7 g/dL      Albumin 3.9 g/dL      AST (SGOT) 086 (H) U/L      ALT 256 (H) U/L      Alkaline Phosphatase 97 U/L      Bilirubin, Total 0.6 mg/dL      Globulin 3.8 (H) g/dL      Albumin/Globulin Ratio 1.0      Anion Gap 13.0     Lipase [[id number]]  (Abnormal) Collected:03/30/13 2039    Specimen Information:Blood Updated:03/30/13 2106     Lipase 844 (H) U/L     GFR [[id number]] Collected:03/30/13 2039     EGFR >60.0 Updated:03/30/13 2106    Lactic Acid [[id number]] Collected:03/30/13 2039    Specimen Information:Blood Updated:03/30/13 2105     Lactic acid 1.4 mmol/L     CBC with differential [[id number]]  (Abnormal) Collected:03/30/13 2039    Specimen Information:Blood / Blood Updated:03/30/13 2047     WBC 11.60 (H)      RBC 4.79      Hgb 14.3 g/dL      Hematocrit 47.4 %      MCV 88.5 fL      MCH 29.9 pg      MCHC 33.7 g/dL      RDW 14 %      Platelets 291      MPV 10.5 fL      Neutrophils 72 %      Lymphocytes Automated 21 %      Monocytes 6 %      Eosinophils Automated 1 %      Basophils Automated 0 %      Immature Granulocyte 0 %      Neutrophils Absolute 8.41 (H)      Abs Lymph Automated 2.43      Abs Mono Automated  0.65      Abs Eos Automated 0.10      Absolute Baso Automated 0.01      Absolute Immature Granulocyte 0.02               Rads:   Radiological Procedure reviewed.     Signed by: Jerel Shepherd

## 2013-03-31 NOTE — Progress Notes (Signed)
Hospitalist Addendum: see detail H&P   Patient seen and examined. I have directly reviewed the clinical findings, lab, imaging studies and management of this patient in detail.   CC:  Chief Complaint   Patient presents with   . Abdominal Pain   . Emesis       Physical:  Filed Vitals:    03/31/13 0025 03/31/13 0529 03/31/13 0947 03/31/13 1332   BP:  127/59 113/55 117/56   Pulse:  72 57 54   Temp:  97.2 F (36.2 C) 97.2 F (36.2 C) 97.7 F (36.5 C)   TempSrc:  Temporal Artery Oral Oral   Resp:  18 19 16    Height: 1.6 m (5\' 3" )      Weight: 86.7 kg (191 lb 2.2 oz)      SpO2:  98% 93% 95%     Heart: Normal rate.  S1 normal and S2 normal.    Chest: Symmetric chest wall expansion.   Abdomen: Abdomen is soft, non-distended. Bowel sounds are normal.  There is mild epigastric abdominal tenderness. There is no splenomegaly or hepatomegaly.    Assessment and plan:  Acute pancreatitis--likely due to gallstones. Pt improving. Continue IV fluids, abx and pain meds. Surgery consult called to Dr. Reginia Naas and pt d/w [place] Jansky, M.D.  03/31/2013  3:29 PM

## 2013-04-01 ENCOUNTER — Ambulatory Visit: Payer: Self-pay

## 2013-04-01 ENCOUNTER — Encounter: Payer: Self-pay | Admitting: Certified Registered"

## 2013-04-01 ENCOUNTER — Inpatient Hospital Stay: Payer: Medicare Other | Admitting: Certified Registered"

## 2013-04-01 ENCOUNTER — Encounter
Admission: EM | Disposition: A | Discharge status: Home / Self Care | Payer: Self-pay | Source: Home / Self Care | Attending: Internal Medicine

## 2013-04-01 LAB — ECG 12-LEAD
Atrial Rate: 56 {beats}/min
P Axis: 46 degrees
P-R Interval: 166 ms
Q-T Interval: 432 ms
QRS Duration: 88 ms
QTC Calculation (Bezet): 416 ms
R Axis: 35 degrees
T Axis: 24 degrees
Ventricular Rate: 56 {beats}/min

## 2013-04-01 LAB — CBC AND DIFFERENTIAL
Basophils Absolute Automated: 0.02 (ref 0.00–0.20)
Basophils Automated: 0 %
Eosinophils Absolute Automated: 0.52 (ref 0.00–0.70)
Eosinophils Automated: 7 %
Hematocrit: 37.2 % (ref 37.0–47.0)
Hgb: 12 g/dL (ref 12.0–16.0)
Immature Granulocytes Absolute: 0.01
Immature Granulocytes: 0 %
Lymphocytes Absolute Automated: 2.13 (ref 0.50–4.40)
Lymphocytes Automated: 28 %
MCH: 29.1 pg (ref 28.0–32.0)
MCHC: 32.3 g/dL (ref 32.0–36.0)
MCV: 90.1 fL (ref 80.0–100.0)
MPV: 10.4 fL (ref 9.4–12.3)
Monocytes Absolute Automated: 0.54 (ref 0.00–1.20)
Monocytes: 7 %
Neutrophils Absolute: 4.31 (ref 1.80–8.10)
Neutrophils: 57 %
Platelets: 236 (ref 140–400)
RBC: 4.13 — ABNORMAL LOW (ref 4.20–5.40)
RDW: 14 % (ref 12–15)
WBC: 7.52 (ref 3.50–10.80)

## 2013-04-01 LAB — COMPREHENSIVE METABOLIC PANEL
ALT: 118 U/L — ABNORMAL HIGH (ref 0–55)
AST (SGOT): 47 U/L — ABNORMAL HIGH (ref 5–34)
Albumin/Globulin Ratio: 1.1 (ref 0.9–2.2)
Albumin: 3 g/dL — ABNORMAL LOW (ref 3.5–5.0)
Alkaline Phosphatase: 71 U/L (ref 40–150)
Anion Gap: 7 (ref 5.0–15.0)
BUN: 4.7 mg/dL — ABNORMAL LOW (ref 7.0–19.0)
Bilirubin, Total: 0.4 mg/dL (ref 0.2–1.2)
CO2: 23 (ref 22–29)
Calcium: 8.1 mg/dL — ABNORMAL LOW (ref 8.5–10.5)
Chloride: 113 — ABNORMAL HIGH (ref 98–107)
Creatinine: 0.7 mg/dL (ref 0.6–1.0)
Globulin: 2.8 g/dL (ref 2.0–3.6)
Glucose: 95 mg/dL (ref 70–100)
Potassium: 4.1 (ref 3.5–5.1)
Protein, Total: 5.8 g/dL — ABNORMAL LOW (ref 6.0–8.3)
Sodium: 143 (ref 136–145)

## 2013-04-01 LAB — LIPASE: Lipase: 24 U/L (ref 8–78)

## 2013-04-01 LAB — GFR: EGFR: >60

## 2013-04-01 SURGERY — LAPAROSCOPIC, CHOLECYSTECTOMY
Anesthesia: Anesthesia General | Site: Abdomen | Wound class: Clean Contaminated

## 2013-04-01 MED ORDER — MIDAZOLAM HCL 2 MG/2ML IJ SOLN
INTRAMUSCULAR | Status: AC
Start: 2013-04-01 — End: ?
  Filled 2013-04-01: qty 2

## 2013-04-01 MED ORDER — DEXAMETHASONE SODIUM PHOSPHATE 4 MG/ML IJ SOLN
INTRAMUSCULAR | Status: AC
Start: 2013-04-01 — End: ?
  Filled 2013-04-01: qty 2

## 2013-04-01 MED ORDER — MIDAZOLAM HCL 2 MG/2ML IJ SOLN
INTRAMUSCULAR | Status: DC | PRN
Start: 2013-04-01 — End: 2013-04-01
  Administered 2013-04-01: 2 mg via INTRAVENOUS

## 2013-04-01 MED ORDER — DOCUSATE SODIUM 100 MG PO CAPS
100.0000 mg | ORAL_CAPSULE | Freq: Two times a day (BID) | ORAL | Status: DC
Start: 2013-04-01 — End: 2013-04-02
  Administered 2013-04-01 – 2013-04-02 (×2): 100 mg via ORAL
  Filled 2013-04-01 (×2): qty 1

## 2013-04-01 MED ORDER — KETOROLAC TROMETHAMINE 30 MG/ML IJ SOLN
INTRAMUSCULAR | Status: AC
Start: 2013-04-01 — End: ?
  Filled 2013-04-01: qty 1

## 2013-04-01 MED ORDER — FUROSEMIDE 10 MG/ML IJ SOLN
20.0000 mg | Freq: Once | INTRAMUSCULAR | Status: AC
Start: 2013-04-01 — End: 2013-04-01
  Administered 2013-04-01: 20 mg via INTRAVENOUS
  Filled 2013-04-01: qty 2

## 2013-04-01 MED ORDER — BUPIVACAINE HCL 0.25 % IJ SOLN
INTRAMUSCULAR | Status: DC | PRN
Start: 2013-04-01 — End: 2013-04-01
  Administered 2013-04-01: 30 mL

## 2013-04-01 MED ORDER — ROCURONIUM BROMIDE 50 MG/5ML IV SOLN
INTRAVENOUS | Status: DC | PRN
Start: 2013-04-01 — End: 2013-04-01
  Administered 2013-04-01: 40 mg via INTRAVENOUS

## 2013-04-01 MED ORDER — LACTATED RINGERS IV SOLN
INTRAVENOUS | Status: DC
Start: 2013-04-01 — End: 2013-04-01
  Administered 2013-04-01: 1000 mL via INTRAVENOUS

## 2013-04-01 MED ORDER — METOCLOPRAMIDE HCL 5 MG/ML IJ SOLN
10.0000 mg | Freq: Four times a day (QID) | INTRAMUSCULAR | Status: DC
Start: 2013-04-01 — End: 2013-04-02
  Administered 2013-04-01 – 2013-04-02 (×3): 10 mg via INTRAVENOUS
  Filled 2013-04-01 (×3): qty 2

## 2013-04-01 MED ORDER — LIDOCAINE HCL 2 % IJ SOLN
INTRAMUSCULAR | Status: DC | PRN
Start: 2013-04-01 — End: 2013-04-01
  Administered 2013-04-01: 100 mg

## 2013-04-01 MED ORDER — GLYCOPYRROLATE 0.2 MG/ML IJ SOLN
INTRAMUSCULAR | Status: DC | PRN
Start: 2013-04-01 — End: 2013-04-01
  Administered 2013-04-01: 0.4 mg via INTRAVENOUS

## 2013-04-01 MED ORDER — HYDROCODONE-ACETAMINOPHEN 5-325 MG PO TABS
1.0000 | ORAL_TABLET | ORAL | Status: DC | PRN
Start: 2013-04-01 — End: 2013-04-02

## 2013-04-01 MED ORDER — ONDANSETRON HCL 4 MG/2ML IJ SOLN
INTRAMUSCULAR | Status: AC
Start: 2013-04-01 — End: ?
  Filled 2013-04-01: qty 2

## 2013-04-01 MED ORDER — ROCURONIUM BROMIDE 50 MG/5ML IV SOLN
INTRAVENOUS | Status: AC
Start: 2013-04-01 — End: ?
  Filled 2013-04-01: qty 5

## 2013-04-01 MED ORDER — HYDROMORPHONE HCL PF 1 MG/ML IJ SOLN
0.5000 mg | INTRAMUSCULAR | Status: DC | PRN
Start: 2013-04-01 — End: 2013-04-01
  Administered 2013-04-01: 0.5 mg via INTRAVENOUS

## 2013-04-01 MED ORDER — DEXAMETHASONE SODIUM PHOSPHATE 4 MG/ML IJ SOLN (WRAP)
INTRAMUSCULAR | Status: DC | PRN
Start: 2013-04-01 — End: 2013-04-01
  Administered 2013-04-01: 8 mg via INTRAVENOUS

## 2013-04-01 MED ORDER — PROMETHAZINE HCL 25 MG/ML IJ SOLN
6.2500 mg | Freq: Once | INTRAMUSCULAR | Status: DC | PRN
Start: 2013-04-01 — End: 2013-04-01

## 2013-04-01 MED ORDER — FENTANYL CITRATE 0.05 MG/ML IJ SOLN
INTRAMUSCULAR | Status: DC | PRN
Start: 2013-04-01 — End: 2013-04-01
  Administered 2013-04-01 (×2): 50 ug via INTRAVENOUS
  Administered 2013-04-01: 100 ug via INTRAVENOUS

## 2013-04-01 MED ORDER — ONDANSETRON HCL 4 MG/2ML IJ SOLN
INTRAMUSCULAR | Status: DC | PRN
Start: 2013-04-01 — End: 2013-04-01
  Administered 2013-04-01: 4 mg via INTRAVENOUS

## 2013-04-01 MED ORDER — BUPIVACAINE HCL (PF) 0.25 % IJ SOLN
INTRAMUSCULAR | Status: AC
Start: 2013-04-01 — End: ?
  Filled 2013-04-01: qty 30

## 2013-04-01 MED ORDER — FENTANYL CITRATE 0.05 MG/ML IJ SOLN
INTRAMUSCULAR | Status: AC
Start: 2013-04-01 — End: ?
  Filled 2013-04-01: qty 5

## 2013-04-01 MED ORDER — POLYETHYLENE GLYCOL 3350 17 G PO PACK
17.0000 g | PACK | Freq: Every day | ORAL | Status: DC
Start: 2013-04-01 — End: 2013-04-02
  Administered 2013-04-02: 17 g via ORAL
  Filled 2013-04-01 (×2): qty 1

## 2013-04-01 MED ORDER — PROPOFOL 10 MG/ML IV EMUL
INTRAVENOUS | Status: DC | PRN
Start: 2013-04-01 — End: 2013-04-01
  Administered 2013-04-01: 200 mg via INTRAVENOUS

## 2013-04-01 MED ORDER — PROMETHAZINE HCL 25 MG/ML IJ SOLN
12.5000 mg | Freq: Four times a day (QID) | INTRAMUSCULAR | Status: DC | PRN
Start: 2013-04-01 — End: 2013-04-02
  Administered 2013-04-01: 12.5 mg via INTRAVENOUS
  Filled 2013-04-01: qty 1

## 2013-04-01 MED ORDER — MEPERIDINE HCL 25 MG/ML IJ SOLN
12.5000 mg | Freq: Two times a day (BID) | INTRAMUSCULAR | Status: DC | PRN
Start: 2013-04-01 — End: 2013-04-01

## 2013-04-01 MED ORDER — IBUPROFEN 400 MG PO TABS
400.0000 mg | ORAL_TABLET | Freq: Three times a day (TID) | ORAL | Status: DC
Start: 2013-04-01 — End: 2013-04-02
  Administered 2013-04-01 – 2013-04-02 (×3): 400 mg via ORAL
  Filled 2013-04-01 (×3): qty 1

## 2013-04-01 MED ORDER — METOCLOPRAMIDE HCL 5 MG/ML IJ SOLN
10.0000 mg | Freq: Once | INTRAMUSCULAR | Status: DC | PRN
Start: 2013-04-01 — End: 2013-04-01

## 2013-04-01 MED ORDER — MORPHINE SULFATE 2 MG/ML IJ/IV SOLN (WRAP)
2.0000 mg | Status: DC | PRN
Start: 2013-04-01 — End: 2013-04-01

## 2013-04-01 MED ORDER — PROPOFOL 10 MG/ML IV EMUL
INTRAVENOUS | Status: AC
Start: 2013-04-01 — End: ?
  Filled 2013-04-01: qty 20

## 2013-04-01 MED ORDER — ERTAPENEM 1 GM MBP (CNR)
Status: AC
Start: 2013-04-01 — End: 2013-04-01
  Administered 2013-04-01: 1 g via INTRAVENOUS
  Filled 2013-04-01: qty 50

## 2013-04-01 MED ORDER — NEOSTIGMINE METHYLSULFATE 1 MG/ML IJ SOLN
INTRAMUSCULAR | Status: DC | PRN
Start: 2013-04-01 — End: 2013-04-01
  Administered 2013-04-01: 3 mg via INTRAVENOUS

## 2013-04-01 MED ORDER — LIDOCAINE HCL (PF) 2 % IJ SOLN
INTRAMUSCULAR | Status: AC
Start: 2013-04-01 — End: ?
  Filled 2013-04-01: qty 5

## 2013-04-01 MED ORDER — HYDROMORPHONE HCL PF 1 MG/ML IJ SOLN
INTRAMUSCULAR | Status: AC
Start: 2013-04-01 — End: 2013-04-01
  Administered 2013-04-01: 0.5 mg via INTRAVENOUS
  Filled 2013-04-01: qty 1

## 2013-04-01 SURGICAL SUPPLY — 60 items
APPLCATOR CHLORAPREP 26ML (Prep) ×2 IMPLANT
APPLICATOR ENDOSCOPIC L41 CM NONREFLECTIVE CANNULA CANNULATED STYLET (Hemostat) IMPLANT
APPLICATOR ESCP SS FLSL 5MM 41CM LF STRL (Hemostat)
APPLIER CLIP ENDO ROTATE DISP (Endoscopic Supplies) IMPLANT
APPLIER IN CLP TI MED LG E-CLP III SUP (Staplers) ×1
APPLIER INTERNAL CLIP MEDIUM LARGE L33 (Staplers) ×1
APPLIER INTERNAL CLIP MEDIUM LARGE L33 CM TITANIUM PISTOL GRIP GLARE (Staplers) ×1 IMPLANT
BAG ENDO POUCH (Procedure Accessories) ×2 IMPLANT
BLADE S/SU RIBBACK CARB STL 11 (Blade) ×2 IMPLANT
CANULA STABILITY 5MM (Procedure Accessories) ×6 IMPLANT
CATH CHOLANGIOGRAM 4.5X18IN (Procedure Accessories) IMPLANT
CLIP ENDO 10 MM (Laparoscopy Supplies) IMPLANT
DRAPE C-ARM (Drape) IMPLANT
DRAPE SRG 122X77IN LF STRL LAPSCP FEN (Drape) ×2
DRAPE SURGICAL LAPROSCOPIC FENESTRATION REINFORCED COLLECTION POUCH (Drape) ×1 IMPLANT
DRESSING WND PU COVAD + 2X2IN LF STRL (Dressing) ×8 IMPLANT
ELECTRODE ELECTROSURGICAL J HOOK (Patient Supply) ×1
ELECTRODE ELECTROSURGICAL J HOOK STANDARD L32 CM OD5 MM CONMED EXTEND (Patient Supply) ×1 IMPLANT
ELECTRODE ESURG J HK STD 5MM 32CM STRL (Patient Supply) ×1
GLOVE SRG 6 BGL SNSR LTX STRL PF TXTR (Glove) ×1
GLOVE SURGICAL 6 BIOGEL SENSOR POWDER (Glove) ×1
GLOVE SURGICAL 6 BIOGEL SENSOR POWDER FREE TEXTURE BEAD CUFF STRAW (Glove) ×1 IMPLANT
INHIBITOR ANTI FOG (Procedure Accessories) ×2 IMPLANT
IRRIGATOR SUCTION ERGONOMIC HAND PIECE STRYKEFLOW II (Suction) ×1 IMPLANT
IRRIGATOR SUCTION STRYKEFLOW 2 (Suction) ×1
KIT HEMOSTATIC MALLEABLE APPLICATOR FLOSEAL 13CM MATRIX 5ML (Hemostat) IMPLANT
KIT HMST MTRX 5ML 13CM FLSL MLBL APL (Hemostat)
MANIFOLD NEPTUNE II 4 PORT (Procedure Accessories) ×2 IMPLANT
MASTISOL VIAL 2/3CC STRL (Skin Closure) ×2 IMPLANT
NEEDLE INJ SFTY 22GX1.5IN (Needles) ×2 IMPLANT
NEEDLE INSFL SS 14GA 12CM LTX STRL HFLO (Needles) ×1
NEEDLE INSUFFLATION L12 CM OD14 GA (Needles) ×1
NEEDLE INSUFFLATION L12 CM OD14 GA EXCEL PNEUMOPERITONEUM HIGH FLOW (Needles) ×1 IMPLANT
PAD ELECTROSRG GRND REM W CRD (Procedure Accessories) ×2 IMPLANT
PLUME AWAY ULTRA 6.0 (Procedure Accessories) ×2 IMPLANT
SET SEAL 10-12MM (Laparoscopy Supplies) IMPLANT
SET SEAL 5MM (Laparoscopy Supplies) IMPLANT
SET SEAL AND OBTURATOR 5MM (Laparoscopy Supplies) IMPLANT
SLEEVE CMPR NYL MED THG LGTH SCD EXP LF (Sleeve) ×2
SLEEVE COMPRESSION NYLON MEDIUM THIGH LENGTH KENDALL ADJUSTABLE (Sleeve) ×1 IMPLANT
SLEEVE TROCAR THORACIC 10/12MM (Endoscopic Supplies) ×2 IMPLANT
SOLUTION IRR LR 3L ARTHMTC LF PLS CNTNR (Irrigation Solutions) ×1
SOLUTION IRRIGATION LACTATED RINGERS (Irrigation Solutions) ×1
SOLUTION IRRIGATION LACTATED RINGERS 3000 ML PLASTIC CONTAINER (Irrigation Solutions) ×1 IMPLANT
STOPCOCK IV 4 WAY LARGE BORE ROTATE MALE (IV Supply) ×1
STOPCOCK IV 4 WAY LARGE BORE ROTATE MALE LUER LOCK ADAPTER LIPID (IV Supply) ×1 IMPLANT
STOPCOCK IV LF STRL 4W LG BORE ROT M LL (IV Supply) ×1
STRIP SKIN CLOSURE L4 IN X W1/2 IN (Dressing) ×1
STRIP SKIN CLOSURE L4 IN X W1/2 IN REINFORCE STERI-STRIP POLYESTER (Dressing) ×1 IMPLANT
STRIP SKNCLS PLSTR STRSTRP 4X.5IN LF (Dressing) ×1
SUTURE ABS 4-0 PC5 VCL MTPS 18IN BRD (Suture) ×1
SUTURE COATED VICRYL 4-0 PC-5 L18 IN (Suture) ×1
SUTURE COATED VICRYL 4-0 PC-5 L18 IN BRAID COATED UNDYED ABSORBABLE (Suture) ×1 IMPLANT
SUTURE VICRYL 0 UR6 27IN (Suture) ×2 IMPLANT
SYRINGE LEUR LOK TIP 30 ML (Syringes, Needles) ×2 IMPLANT
TIP SCISSORS 2 ACTION CURVE W5 MM (Endoscopic Supplies) ×1 IMPLANT
TIP SCSR CRV VM METZ 5MM 2 ACT DISP (Endoscopic Supplies) ×1
TRAY MINOR (Pack) ×2 IMPLANT
TUBING INSUFFLATION 10FT (Tubing) ×2 IMPLANT
TUBING SCT IRR (Suction) ×1

## 2013-04-01 NOTE — Anesthesia Postprocedure Evaluation (Signed)
At the conclusion of the procedure, the patient was uneventfully transported to the PACU in stable condition with good ventilatory exchange. Uneventful transition to PACU care.    At this time the patient is awake/easily arousable. The patient's respirations, and cardiovascular status have been evaluated and deemed stable. Post op nausea, vomiting and pain are being evaluated, treated and controlled as effectively as possible without compromising the patients respiratory and cardiovascular status.    Review of input and output information, assessment of cardiovascular course and current physical findings are consistent with adequate hydrational support. Please refer to PACU nursing documentation for confirmation of attainment of normothermia.    There were no anesthetic-related complications evident at this time.    The patient is recovering well and a smooth transition to the next phase of care is anticipated.

## 2013-04-01 NOTE — Plan of Care (Signed)
Pt denied pain/discomfort with exception of nausea prior to cholecystectomy.  Given Zofran with good effect.  Sent to OR at 0945.  Pt returned to unit around 1300 post-laparoscopic cholecystectomy.  Reported nausea on return to unit.  Dr. Doyne Keel notified and pt given Phenergan with good effect.  Pt medicated again for abdominal pain and nausea with Dilaudid and Reglan.  Expiratory wheeze and coarseness noted in lungs.  Dr. Doyne Keel notified and pt's IV fluids decreased and pt given Lasix.  Minor oozing noted at umbilical lap site.  Dr. Reginia Naas aware.  Will continue to monitor.

## 2013-04-01 NOTE — Op Note (Signed)
Laparoscopic Cholecystectomy  Procedure Note    Indications: This patient presents with symptomatic gallbladder disease and will undergo laparoscopic cholecystectomy.    Pre-operative Diagnosis: Cholecytitis/acute cholecystitis    Post-operative Diagnosis: same with hydrops of the gallbladder  Surgeon: Jerel Shepherd  Anesthesia: General endotracheal anesthesia    ASA Class: 2    Procedure Details   The patient was seen again in the Holding Room. The risks, benefits, complications, treatment options, and expected outcomes were discussed with the patient. The possibilities of reaction to medication, pulmonary aspiration, perforation of viscus, bleeding, recurrent infection, finding a normal gallbladder, the need for additional procedures, failure to diagnose a condition, the possible need to convert to an open procedure, and creating a complication requiring transfusion or operation were discussed with the patient. The patient and/or family concurred with the proposed plan, giving informed consent.  The patient was taken to Operating Room, identified as Lindsay Elliott and the procedure verified as Laparoscopic Cholecystectomy. A Time Out was held and the above information confirmed.    Prior to the induction of general anesthesia, antibiotic prophylaxis was administered and an orogastric tube introduced. General endotracheal anesthesia was then administered and tolerated well. After the induction, the abdomen was prepped in the usual sterile fashion. The patient was positioned in the supine position, with reverse Trendelenburg and a roll to the left.    A periumbilical incision was created and Veress needle introduced.   Pneumoperitoneum was then created with  CO2 to 15mm Hg. A 10mm trocar was introduced, then  additional 5mm trocars were introduced under direct vision.     The gallbladder was identified, the fundus grasped and retracted cephalad.  The gallbladder was aspirated to aid in manipulation as it  was tense and distended--clear fluid c/w hydrops was obtained.   Adhesions were lysed bluntly and with the electrocautery where indicated. The pt had a large fatty liver.  There were extensive pericholecystic adhesions and bowel loops were dilated consistent with ileus.   The infundibulum was grasped and retracted laterally, exposing the peritoneum overlying the triangle of Calot.  The cystic duct was clearly identified as well as the cystic artery.     The cystic duct was then triply ligated with surgical clips on the patient side and singly clipped on the gallbladder side and divided. The cystic artery was  ligated with clips and divided as well.     The gallbladder was dissected from the liver bed in retrograde fashion with the electrocautery. The gallbladder was placed into an endoscopic retrieval bad and  removed. The liver bed was irrigated and inspected. Hemostasis was achieved with the electrocautery. Excess irrigant was suctioned.  Local anesthetic of 0.25% Marcaine was infiltrated into trocar sites and trocars were removed.    The fascia at the umbilical site was then closed with a figure of eight suture of 0 Vicryl; the skin was then closed with 4-0 Vicryl and mastisol and steristrips applied.    Instrument, sponge, and needle counts were correct at closure and at the conclusion of the case. The pt was extubated in the operating room and taken to recovery room in stable condition.      Findings:  Cholecystitis/cholelithiasis/hydrops of gallbladder    Estimated Blood Loss: minimal           Drains: none           Specimens: Gallbladder and contents         Complications: no immediate    Alla Sloma Darlene  Leshae Mcclay MD 04/01/2013 11:24 AM

## 2013-04-01 NOTE — Transfer of Care (Signed)
Anesthesia Transfer of Care Note    Patient: Lindsay Elliott    Procedures performed: Procedure(s) with comments:  LAPAROSCOPIC, CHOLECYSTECTOMY    Anesthesia type: General ETT    Patient location:Phase I PACU    Last vitals:   Filed Vitals:    04/01/13 1127   BP: 138/64   Pulse: 65   Temp: 97.2 F (36.2 C)   Resp: 16   SpO2: 97%       Post pain: Patient not complaining of pain, continue current therapy      Mental Status:sedated    Respiratory Function: tolerating nasal cannula    Cardiovascular: stable    Nausea/Vomiting: patient not complaining of nausea or vomiting    Hydration Status: adequate    Post assessment: no apparent anesthetic complications, no reportable events and no evidence of recall

## 2013-04-01 NOTE — Brief Op Note (Signed)
BRIEF OP NOTE    Date Time: 04/01/2013 11:23 AM    Patient Name:   Lindsay Elliott    Date of Operation:    04/01/2013    Providers Performing:   Surgeon(s):  Tanelle Lanzo, Lyndal Pulley, MD    Operative Procedure:   Procedure(s):  LAPAROSCOPIC, CHOLECYSTECTOMY    Preoperative Diagnosis:   Pre-Op Diagnosis Codes:     Cholelithiasis, acute cholecystitis    Postoperative Diagnosis:   Choleycystitis, Cholelithiasis    Anesthesia:   General    Estimated Blood Loss:   25cc    Implants:   * No implants in log *    Drains:   Drains: no    Specimens:        SPECIMENS (last 24 hours)      Pathology Specimens     Row Name 04/01/13 1000             Specimen Information    Specimen Testing Required Routine Pathology     Specimen ID  A     Specimen Description GALLBLADDER AND CONTENTS         Findings:   Hydrops of gallbladder--acute inflammation and extensive pericholecystic adhesions.    Complications:   No immediate.      Signed by: Jerel Shepherd, MD                                                                           [place] [facility]  04/01/2013  11:23 AM

## 2013-04-01 NOTE — PACU (Signed)
1127 pt very sleepy O2 via NC lap sites x 3 dry and intact

## 2013-04-01 NOTE — Anesthesia Preprocedure Evaluation (Addendum)
Anesthesia Evaluation    AIRWAY    Mallampati: III    TM distance: >3 FB  Neck ROM: full  Mouth Opening:limited   CARDIOVASCULAR    cardiovascular exam normal       DENTAL         PULMONARY    pulmonary exam normal     OTHER FINDINGS                      Anesthesia Plan    ASA 2     general               (EKG SB)      intravenous induction   Detailed anesthesia plan: general endotracheal        Post op pain management: per surgeon    informed consent obtained    Plan discussed with CRNA.

## 2013-04-01 NOTE — Progress Notes (Signed)
[facility] Hospitalist Daily Progress Note        Date Time: 04/01/2013  1:28 PM  Patient Name:Lindsay Elliott  QIO:[id number]  PCP: Gregor Hams, MD  Attending Physician:Needham Biggins Doyne Keel M.D.      Chief Complaint:      Chief Complaint   Patient presents with   . Abdominal Pain   . Emesis       Subjective:   Says feeling very nauseated, feels like she is going to throw up. Just back from lap chole    Assessment/Plan     Active Diagnosis: Principal Problem:   *Acute pancreatitis  Active Problems:   Cholelithiasis   Elevated LFTs   Leukocytosis  1. Acute pancreatitis  2. Acute cholecystitis  3.Cholelithiasis  4.Elevated LFT's  5. Leukocytosis  Continue IV fluids, IV pain meds and IV abx. Advance diet slowly as tolerated. Likely home in am if tolerates diet.    DVT Prohylaxis:SEDs   Code Status: Full Code   Disposition: home  Prognosis:Fair  Type of Admission:Inpatient  Estimated Length of Stay (including stay in the ER receiving treatment): 3-4 days  Medical Necessity for stay:pancreatitis    Allergies:   No Known Allergies    Physical Exam:    height is 1.6 m (5\' 3" ) and weight is 86.637 kg (191 lb). Her temporal artery temperature is 97.3 F (36.3 C). Her blood pressure is 148/67 and her pulse is 52. Her respiration is 18 and oxygen saturation is 97%.   Body mass index is 33.84 kg/(m^2).  Filed Vitals:    04/01/13 1200 04/01/13 1210 04/01/13 1220 04/01/13 1230   BP: 152/65 148/66 148/67    Pulse: 54 54 56 52   Temp:       TempSrc:       Resp: 18 18 20 18    Height:       Weight:       SpO2: 97% 97% 96% 97%     Intake and Output Summary (Last 24 hours) at Date Time    Intake/Output Summary (Last 24 hours) at 04/01/13 1328  Last data filed at 04/01/13 1122   Gross per 24 hour   Intake 3990.83 ml   Output      0 ml   Net 3990.83 ml       Constitutional: Patient is oriented to person, place, and time. Patient appears well-developed and well-nourished.   Head: Normocephalic  and atraumatic.  Eyes- pupils equal and reactive, extraocular eye movements intact, sclera anicteric  Ears - external ear canals normal, right ear normal, left ear normal  Nose - normal and patent, no erythema, discharge or polyps and normal nontender sinuses  Mouth - mucous membranes moist, pharynx normal without lesions  Neck: Normal range of motion. Neck supple. No JVD present. No tracheal deviation present. No thyromegaly present.   Cardiovascular: Normal rate, regular rhythm, normal heart sounds and intact distal pulses.  Exam reveals no gallop and no friction rub. No murmur heard.  Pulmonary/Chest: Effort normal and breath sounds normal. No stridor. No respiratory distress. Patient has no wheezes. No rales were present.  Exhibits no tenderness.   Abdominal: Soft. Bowel sounds are normal. Patient exhibits no distension and no mass was palpable. There is no tenderness at incision sites. There is no rebound and no guarding.   Musculoskeletal: Normal range of motion. Patient exhibits no edema and no tenderness.   Lymphadenopathy:  Patient has no cervical adenopathy.   Neurological: Patient is alert and oriented to  person, place, and time and has normal reflexes. No cranial nerve deficit.  Normal muscle tone. Coordination normal.   Skin: Skin is warm. No rash noted. Patient is not diaphoretic. No erythema. No pallor.   Psychiatric: Has normal mood and affect. Behavior is normal. Judgment and thought content normal.    Consult Input/Plan     Plan  None    Medications:     Current Facility-Administered Medications   Medication Dose Route Frequency Last Rate Last Dose   . 0.9%  NaCl infusion   Intravenous Continuous 125 mL/hr at 03/31/13 1332     . acetaminophen (TYLENOL) tablet 500 mg  500 mg Oral Q4H PRN   500 mg at 03/31/13 2010   . docusate sodium (COLACE) capsule 100 mg  100 mg Oral BID       . enoxaparin (LOVENOX) syringe 40 mg  40 mg Subcutaneous Daily   40 mg at 03/31/13 1011   . [COMPLETED] ertapenem [facility])  1 G in sodium chloride 0.9% 50 mL IVPB mini-bag plus       1 g at 04/01/13 1013   . famotidine (PEPCID) injection 20 mg  20 mg Intravenous Q12H SCH   20 mg at 03/31/13 2121   . HYDROcodone-acetaminophen (NORCO) 5-325 MG per tablet 1 tablet  1 tablet Oral Q4H PRN       . HYDROmorphone (DILAUDID) injection 0.5 mg  0.5 mg Intravenous Q2H PRN   0.5 mg at 04/01/13 1610   . ibuprofen (ADVIL,MOTRIN) tablet 400 mg  400 mg Oral TID PC       . [COMPLETED] influenza trivalent-split (FLUZONE/AFLURIA/FLULAVAL) injection 0.5 mL  0.5 mL Intramuscular Once   0.5 mL at 03/31/13 1532   . metoclopramide (REGLAN) injection 10 mg  10 mg Intravenous Q6H SCH       . ondansetron (ZOFRAN) injection 4 mg  4 mg Intravenous Q6H PRN   4 mg at 04/01/13 0739   . polyethylene glycol (MIRALAX) packet 17 g  17 g Oral Daily       . promethazine (PHENERGAN) injection 12.5 mg  12.5 mg Intravenous Q6H PRN       . [DISCONTINUED] bupivacaine (MARCAINE) 0.25 % injection    PRN   30 mL at 04/01/13 1103   . [DISCONTINUED] HYDROmorphone (DILAUDID) injection 0.5 mg  0.5 mg Intravenous Q5 Min PRN   0.5 mg at 04/01/13 1228   . [DISCONTINUED] lactated ringers infusion   Intravenous Continuous   1,000 mL at 04/01/13 1007   . [DISCONTINUED] meperidine (DEMEROL) injection 12.5 mg  12.5 mg Intravenous BID PRN       . [DISCONTINUED] metoclopramide (REGLAN) injection 10 mg  10 mg Intravenous Once PRN       . [DISCONTINUED] morphine injection 2 mg  2 mg Intravenous Q5 Min PRN       . [DISCONTINUED] promethazine (PHENERGAN) injection 6.25 mg  6.25 mg Intravenous Once PRN         Facility-Administered Medications Ordered in Other Encounters   Medication Dose Route Frequency Last Rate Last Dose   . [DISCONTINUED] dexamethasone (DECADRON) injection    PRN   8 mg at 04/01/13 1031   . [DISCONTINUED] fentaNYL (SUBLIMAZE) injection    PRN   50 mcg at 04/01/13 1054   . [DISCONTINUED] glycopyrrolate (ROBINUL) injection    PRN   0.4 mg at 04/01/13 1110   . [DISCONTINUED] lidocaine  (XYLOCAINE) 2 % injection    PRN   100 mg at 04/01/13 1020   . [  DISCONTINUED] midazolam (VERSED) injection    PRN   2 mg at 04/01/13 1011   . [DISCONTINUED] neostigmine (PROSTIGMINE) injection   Intravenous PRN   3 mg at 04/01/13 1110   . [DISCONTINUED] ondansetron (ZOFRAN) injection    PRN   4 mg at 04/01/13 1110   . [DISCONTINUED] propofol (DIPRIVAN) injection    PRN   200 mg at 04/01/13 1020   . [DISCONTINUED] rocuronium (ZEMURON) injection    PRN   40 mg at 04/01/13 1020     Review of Systems:   A comprehensive review of systems has no changes since H&P was obtained except as mentioned in the subjective section.    Labs:     Results     Procedure Component Value Units Date/Time    Lipase [[id number]] Collected:04/01/13 0700    Specimen Information:Blood Updated:04/01/13 0831     Lipase 24 U/L     GFR [[id number]] Collected:04/01/13 0700     EGFR >60.0 Updated:04/01/13 0831    Comprehensive metabolic panel [[id number]]  (Abnormal) Collected:04/01/13 0700    Specimen Information:Blood Updated:04/01/13 0831     Glucose 95 mg/dL      BUN 4.7 (L) mg/dL      Creatinine 0.7 mg/dL      Sodium 865      Potassium 4.1      Chloride 113 (H)      CO2 23      CALCIUM 8.1 (L) mg/dL      Protein, Total 5.8 (L) g/dL      Albumin 3.0 (L) g/dL      AST (SGOT) 47 (H) U/L      ALT 118 (H) U/L      Alkaline Phosphatase 71 U/L      Bilirubin, Total 0.4 mg/dL      Globulin 2.8 g/dL      Albumin/Globulin Ratio 1.1      Anion Gap 7.0     CBC and differential [[id number]]  (Abnormal) Collected:04/01/13 0700    Specimen Information:Blood / Blood Updated:04/01/13 0730     WBC 7.52      RBC 4.13 (L)      Hgb 12.0 g/dL      Hematocrit 28.4 %      MCV 90.1 fL      MCH 29.1 pg      MCHC 32.3 g/dL      RDW 14 %      Platelets 236      MPV 10.4 fL      Neutrophils 57 %      Lymphocytes Automated 28 %      Monocytes 7 %      Eosinophils Automated 7 %      Basophils Automated 0 %      Immature Granulocyte 0 %      Neutrophils Absolute 4.31      Abs  Lymph Automated 2.13      Abs Mono Automated 0.54      Abs Eos Automated 0.52      Absolute Baso Automated 0.02      Absolute Immature Granulocyte 0.01           Rads:   Radiological Procedure reviewed.  Radiology Results (24 Hour)     ** No Results found for the last 24 hours. **            Time spent for evaluation, management and coordination of care:   :  Signed by: Christel Mormon  04/01/2013 1:28 PM

## 2013-04-02 MED ORDER — PROMETHAZINE HCL 25 MG PO TABS
12.5000 mg | ORAL_TABLET | Freq: Four times a day (QID) | ORAL | Status: DC | PRN
Start: 2013-04-02 — End: 2013-04-02

## 2013-04-02 MED ORDER — IBUPROFEN 400 MG PO TABS
400.0000 mg | ORAL_TABLET | Freq: Three times a day (TID) | ORAL | Status: DC
Start: 2013-04-02 — End: 2013-11-28

## 2013-04-02 MED ORDER — PROMETHAZINE HCL 12.5 MG PO TABS
12.5000 mg | ORAL_TABLET | Freq: Four times a day (QID) | ORAL | Status: DC | PRN
Start: 2013-04-02 — End: 2013-11-28

## 2013-04-02 MED ORDER — POLYETHYLENE GLYCOL 3350 17 G PO PACK
17.0000 g | PACK | Freq: Every day | ORAL | Status: DC
Start: 2013-04-02 — End: 2013-11-28

## 2013-04-02 MED ORDER — HYDROCODONE-ACETAMINOPHEN 5-325 MG PO TABS
1.0000 | ORAL_TABLET | ORAL | Status: DC | PRN
Start: 2013-04-02 — End: 2013-11-28

## 2013-04-02 MED ORDER — ACETAMINOPHEN 500 MG PO TABS
500.0000 mg | ORAL_TABLET | ORAL | Status: DC | PRN
Start: 2013-04-02 — End: 2017-03-28

## 2013-04-02 NOTE — Plan of Care (Signed)
Problem: Safety  Goal: Patient will be free from injury during hospitalization  Outcome: Progressing  Intervention: Provide and maintain safe environment  Call bell and all possessions within reach.   Intervention: Hourly rounding.  On going.       Problem: Pain  Goal: Patient's pain/discomfort is manageable  Outcome: Completed Date Met:  04/02/13  Pain management per care plan.     Problem: Pain interferes with ability to perform ADL  Goal: Pain at adequate level as identified by patient  Outcome: Progressing  Pt complaining of mild to moderate abdominal pain and nausea. PRN medications with good effect. Comfort function goal identified and met.

## 2013-04-02 NOTE — Discharge Summary (Signed)
[facility] Hospitalist Discharge Note      Date Time: 04/02/2013  3:05 PM  Patient Name:Lindsay Elliott  VWU:[id number]  PCP: Gregor Hams, MD  Attending Physician:Bowden Boody Roosvelt Harps M.D.    Hospital Course:   Please see H&P for complete details of HPI and ROS. The patient was admitted to [facility] and has been diagnosed with the following conditions and has been taken care as mentioned below.    Patient Active Problem List    Diagnosis Date Noted   . Acute pancreatitis 03/31/2013   . Symptomatic cholelithiasis 03/31/2013     S/P Lap Chole done by Dr. Reginia Naas.     . Elevated LFTs 03/31/2013     S/P lap Chole by Dr. Annamary Carolin.on 11/3 was stable clinically on 11/4 to be dicharged home with norco for outpatient pain management.  Type Of Admission: inpatient    Medical Necessity for stay:acute chole with elevated lipase    Date of Admission:   03/30/2013    Date of Discharge:   04/02/2013    Chief Complaint:      Chief Complaint   Patient presents with   . Abdominal Pain   . Emesis       Discharge Diagnosis:   Hospital Problems:  Principal Problem:   *Acute pancreatitis  Active Problems:   Symptomatic cholelithiasis   Elevated LFTs      Lists the present on admission hospital problems  Present on Admission:   . Acute pancreatitis  . Symptomatic cholelithiasis  . Elevated LFTs  . (Resolved) Leukocytosis        Consult Input/Plan     None    Procedures performed:   none    Physical Exam:    height is 1.6 m (5\' 3" ) and weight is 86.637 kg (191 lb). Her temporal artery temperature is 99 F (37.2 C). Her blood pressure is 126/61 and her pulse is 71. Her respiration is 18 and oxygen saturation is 93%.   Body mass index is 33.84 kg/(m^2).  Filed Vitals:    04/02/13 0047 04/02/13 0542 04/02/13 1013 04/02/13 1417   BP: 127/58 126/61 146/66 126/61   Pulse: 55 55 89 71   Temp: 98.6 F (37 C) 97.7 F (36.5 C) 98.6 F (37 C) 99 F (37.2 C)   TempSrc:  Temporal Artery Temporal Artery Temporal Artery   Resp: 16 16 18  18    Height:       Weight:       SpO2: 92% 90% 93% 93%     Intake and Output Summary (Last 24 hours) at Date Time    Intake/Output Summary (Last 24 hours) at 04/02/13 1505  Last data filed at 04/02/13 1417   Gross per 24 hour   Intake   2091 ml   Output      0 ml   Net   2091 ml         Labs:     Results     ** No Results found for the last 24 hours. **            Rads:   Radiological Procedure reviewed.  US Abdomen Limited Ruq    03/30/2013  HISTORY: Right upper quadrant pain, nausea, abdominal liver function test and elevated lipase.  COMPARISON: CT examination of the abdomen and pelvis dated 02/09/2010.   FINDINGS:  Transverse and sagittal images of the abdomen.  The partially visualized abdominal aorta, IVC, and pancreatic body are unremarkable. There is  mild fatty infiltration of the liver without apparent focal hepatic lesion. The gallbladder is not significantly distended. There is a 1.7 cm echogenic focus with posterior shadowing at the neck of the gallbladder. No significant gallbladder wall thickening. No pericholecystic fluid collection is seen. The common bile duct measures 6.9 mm in its proximal portion.  The right kidney measures 10.3 cm in length. No hydronephrosis identified. No ascites or sonographic Murphy sign.      03/30/2013   1.  Cholelithiasis, likely chronic as previously seen on CT dated 02/09/2010. No obvious sonographic evidence of acute cholecystitis however. If clinical suspicion for acute cholecystitis remains high, a HIDA scan can be obtained.  Candi Leash, MD  03/30/2013 10:17 PM     Ct Abdomen Pelvis W Iv And Po Cont    03/30/2013  Indication: Right upper quadrant pain.  Procedure: Contrast-enhanced CT of the abdomen and pelvis. 100 mL of Omnipaque 350 were administered intravenously. Oral contrast material is also present. Axial images were acquired using helical technique.  Comparison: Previous CT February 09, 2010.  Findings: Some calcification is seen in the gallbladder close to the  gallbladder neck, presumably in a gallstone. This was present on previous CT in 2011. There is no gallbladder wall thickening or pericholecystic stranding. No dilatation of the biliary tree. Unremarkable liver, spleen, pancreas, and adrenals. A small circumscribed lesion seen posteriorly in the mid left kidney is likely a benign cyst and was present on previous CT. No hydronephrosis. No urinary tract calculi are seen. There are small amounts of calcified atherosclerotic plaque. No aneurysm of the abdominal aorta. The appendix is absent. The uterus is absent. No adnexal masses or cystic lesions are seen. No colonic diverticula are identified. No evidence of diverticulitis. No free intraperitoneal gas or free fluid. No dilated bowel loops or bowel wall thickening. Normal cardiac size. Included portions of the lungs are unremarkable.      03/30/2013  Impression: Cholelithiasis. No acute abnormal findings are identified.  Wynema Birch, MD  03/30/2013 11:12 PM       Discharge Medications:     Please See Discharge Medication reconciliation for the final list of medications.    Pending Labs:   none  Discharge Destination:   home  Condition at Discharge :   Improved    Time spent for Discharge Care:      Follow-up:   Recommended Follow up with PCP in one week.    Signed by: Brynda Rim, MD

## 2013-04-02 NOTE — Progress Notes (Signed)
Physical assessment complete and plan of care discussed. Pt complaining of mild abdominal pain s/p cholecystectomy and some nausea. Tolerating clear liquids. Lung fields sill with coarse crackles. Pt states she has been up to the bathroom multiple times since receiving the lasix. IV fluids running. NCT monitoring. No other questions or concerns. Will monitor.

## 2013-04-02 NOTE — Discharge Summary -  Nursing (Signed)
Pt tolerating low fat diet this AM with no nausea or pain.  Continued to feel well throughout shift.  Dr. Reginia Naas and Dr. Larina Bras aware.  Discharged from unit at 1800.  Prescription for Norco and Phenergan sent with pt.  Educated on discharge instructions post-cholecystectomy and pain management.  Verbalized understanding.  Belongings sent with pt and left via private car.

## 2013-04-02 NOTE — Discharge Instructions (Signed)
After Gallbladder Surgery  You can usually go home the same day as your surgery. In some cases, you may need to stay overnight. Once you're at home, be sure to follow all your doctor's instructions.  In the Hospital  Bandages will cover your incisions and you may have special boots on your legs to prevent blood clots.To aid recovery, you'll be asked to get up and move as soon as possible. You may also be asked to use a device that helps keep your lungs clear.  At Home  You can get back to your normal routine as soon as you feel able. To speed healing:   Take any prescribed pain medications as directed.   Follow your doctor's instructions about bathing and caring for your incisions.   Walk and move around as often as possible.   Ask your doctor about driving and going back to work. This is often about5-10 days after surgery.  Eating Normally Again  Removing the gallbladder doesn't mean you have to be on a special diet. But you may want to start with light meals. It can also take a few weeks for your digestion to adjust. You may have indigestion, loose stools, or diarrhea. This is normal and should go away in time.  Following Up  Keep follow-up appointments during your recovery. These allow your doctor to check your progress and answer any questions. Be sure to mention if you have any new symptoms. Also mention if you have diarrhea that doesn't go away.    Call your doctor if you have any of the following:   Fever over101For chills   Increasing pain, redness, or drainage at an incision site   Vomiting or nausea that lasts more than12 hours   Prolonged diarrhea      2000-2014 [employer], [address], [place] [address]. All rights reserved. This information is not intended as a substitute for professional medical care. Always follow your healthcare professional's instructions.

## 2013-04-02 NOTE — Progress Notes (Signed)
PROGRESS NOTE    Date Time: 04/02/2013 4:47 PM  Patient Name: Lindsay Elliott, Lindsay Elliott      Assessment:   S/p lap chole--eager for discharge  Cholecystitis/cholelithiasis    Plan:   Discharge  Lowfat diet  Norco/ibuprofen'  F/u two weeks  Instructions reviewed    Subjective:   Nausea has resolved--eager for discharge    Medications:     Current Facility-Administered Medications   Medication Dose Route Frequency   . docusate sodium  100 mg Oral BID   . enoxaparin  40 mg Subcutaneous Daily   . famotidine  20 mg Intravenous Q12H SCH   . [COMPLETED] furosemide  20 mg Intravenous Once   . ibuprofen  400 mg Oral TID PC   . metoclopramide  10 mg Intravenous Q6H SCH   . polyethylene glycol  17 g Oral Daily          . [DISCONTINUED] sodium chloride 50 mL/hr at 04/01/13 1815     acetaminophen, HYDROcodone-acetaminophen, ondansetron, promethazine, [DISCONTINUED] HYDROmorphone    Physical Exam:     Filed Vitals:    04/02/13 1417   BP: 126/61   Pulse: 71   Temp: 99 F (37.2 C)   Resp: 18   SpO2: 93%       Intake and Output Summary (Last 24 hours) at Date Time    Intake/Output Summary (Last 24 hours) at 04/02/13 1647  Last data filed at 04/02/13 1417   Gross per 24 hour   Intake   2091 ml   Output      0 ml   Net   2091 ml       General appearance - alert, well appearing, and in no distress  Abdomen - soft, no unusual tenderness  Incision(s) - clean, dry, intact, no suggestion of infection--old drainage-stable      Labs:     Results     ** No Results found for the last 24 hours. **            Rads:     Radiology Results (24 Hour)     ** No Results found for the last 24 hours. **            Signed by: Jerel Shepherd, MD  04/02/2013 4:47 PM

## 2013-04-02 NOTE — Consults (Signed)
Case Management Initial Discharge Planning Assessment    Psychosocial/Demographic Information   Name of interviewee: Patient    Orientation and decision making abilities of patient (ie a&ox3 able to make decisions, demented pnt, pnt on vent, etc) Alert and oriented and able to make her own decisions.    Does the patient have an Advance Directive? Location? (home/on chart, if home-advised to bring in copy?) No, declined info.    Healthcare Decision Maker (HDM) (if other than the patient) Include relationship and contact information.  Estee, Yohe # [phone])   Any additional emergency contacts? No    Pt lives with Husband    Type of residence where patient lives Multilevel house with 6 steps to enter the house and 15 steps to the bedroom.    Prior level of functioning (ambulation & ADLs) Independent with ADLs and ambulation.    Support system/Transportation Resources-list  (i.e. Does the patient have difficulty getting to appointments or obtaining medications?) Husband and her 3 kids in this area are her support system.    Correct Insurance listed on face sheet - verified with the patient/HDM Yes    Source of Income (SSDI. SSI. Social Security, pension, employment, Catering manager) Employment.      Discharge Planning Services in Place  Name of Primary Care Physician verified in patient banner (update in patient banner if not listed) Yes    PCP Follow up apptmt offered/set up Offered but pt wanted to make an appt herself.    What DME does the patient currently own? (rolling walker, hospital bed, home O2, BiPAP/CPAP, bedside commode, cane, hoyer lift) None    Has the patient been to an Acute Rehab or SNF in the past?  If so, where? No    Does the patient currently have home health or hospice/palliative services in place?  If so, list agency name. No    Does the patient already have community dialysis set up?  If so, where? N/A      Readmission Assessment  Current LACE Score >11? Yes/No No    Is  this patient an inpatient to inpatient 30 day readmission? No    If readmission, what was the previous D/C plan?  What did or didn't work with the previous d/c plan? N/A      Anticipated Discharge Plan  Discussed Anticipated Discharge Date and Discharge Disposition Possibilities with: _X__Patient   ___Healthcare Decision Maker  ___Other   Anticipated Disposition: Option A Home with no anticipated D/C needs.    Anticipated Disposition: Option B    If applicable, were SNF or Hospice choices provided? N/A    Palliative Care Consult needed? (if yes, contact attending MD) No    TCM Referral needed? Yes/No No    Are there any potential barriers to discharge identified?      ___Lack of Insurance  ___Lack of Health Literacy  ___Undocumented  ___No resources for meds or medical care  ___Transportation issues  ___Language/Cultural/Spiritual  ___Cognitive level / capacity  ___Psychiatric or substance abuse issues  ___Co-morbidities  ___Potential abuse or neglect  ___Safety issues in the home  ___Potential placement issues  ___Pt / family disagreement with d/c plan  ___Lack of family support  ___Lack of extended family / friend support  ___Home Estate agent (multi-level home/access          issues)   __X_ NONE     Inpatient Medicare/Medicare HMO Patients Only  Was an initial IMM signed within 24 hours of admission?  (Look in Media Tab,  Documents Table or Shadow Chart) Yes      Uninsured Patients Only  If patient has a spouse, does your spouse have insurance under his/her place of employment?    Did the patient sign up for insurance through the Affordable Care Act?

## 2013-11-28 ENCOUNTER — Emergency Department: Payer: Medicare Other

## 2013-11-28 ENCOUNTER — Inpatient Hospital Stay
Admission: EM | Admit: 2013-11-28 | Discharge: 2013-12-02 | DRG: 438 | Disposition: A | Discharge status: Home / Self Care | Payer: Medicare Other | Attending: Internal Medicine | Admitting: Internal Medicine

## 2013-11-28 ENCOUNTER — Inpatient Hospital Stay: Payer: BLUE CROSS/BLUE SHIELD | Admitting: Internal Medicine

## 2013-11-28 DIAGNOSIS — D72829 Elevated white blood cell count, unspecified: Secondary | ICD-10-CM | POA: Diagnosis present

## 2013-11-28 DIAGNOSIS — R030 Elevated blood-pressure reading, without diagnosis of hypertension: Secondary | ICD-10-CM | POA: Diagnosis present

## 2013-11-28 DIAGNOSIS — Z9089 Acquired absence of other organs: Secondary | ICD-10-CM

## 2013-11-28 DIAGNOSIS — R748 Abnormal levels of other serum enzymes: Secondary | ICD-10-CM

## 2013-11-28 DIAGNOSIS — R739 Hyperglycemia, unspecified: Secondary | ICD-10-CM | POA: Diagnosis present

## 2013-11-28 DIAGNOSIS — R7309 Other abnormal glucose: Secondary | ICD-10-CM | POA: Diagnosis present

## 2013-11-28 DIAGNOSIS — I498 Other specified cardiac arrhythmias: Secondary | ICD-10-CM | POA: Diagnosis not present

## 2013-11-28 DIAGNOSIS — N281 Cyst of kidney, acquired: Secondary | ICD-10-CM

## 2013-11-28 DIAGNOSIS — J189 Pneumonia, unspecified organism: Secondary | ICD-10-CM | POA: Diagnosis not present

## 2013-11-28 DIAGNOSIS — R1013 Epigastric pain: Secondary | ICD-10-CM

## 2013-11-28 DIAGNOSIS — R112 Nausea with vomiting, unspecified: Secondary | ICD-10-CM

## 2013-11-28 DIAGNOSIS — K859 Acute pancreatitis without necrosis or infection, unspecified: Principal | ICD-10-CM | POA: Diagnosis present

## 2013-11-28 DIAGNOSIS — K044 Acute apical periodontitis of pulpal origin: Secondary | ICD-10-CM | POA: Diagnosis present

## 2013-11-28 DIAGNOSIS — R7989 Other specified abnormal findings of blood chemistry: Secondary | ICD-10-CM | POA: Diagnosis present

## 2013-11-28 DIAGNOSIS — IMO0001 Reserved for inherently not codable concepts without codable children: Secondary | ICD-10-CM

## 2013-11-28 LAB — COMPREHENSIVE METABOLIC PANEL
ALT: 83 U/L — ABNORMAL HIGH (ref 0–55)
AST (SGOT): 120 U/L — ABNORMAL HIGH (ref 5–34)
Albumin/Globulin Ratio: 1.1 (ref 0.9–2.2)
Albumin: 4.3 g/dL (ref 3.5–5.0)
Alkaline Phosphatase: 90 U/L (ref 37–106)
Anion Gap: 12 (ref 5.0–15.0)
BUN: 13.1 mg/dL (ref 7.0–19.0)
Bilirubin, Total: 0.5 mg/dL (ref 0.2–1.2)
CO2: 23 mEq/L (ref 22–29)
Calcium: 10.1 mg/dL (ref 8.5–10.5)
Chloride: 104 mEq/L (ref 100–111)
Creatinine: 0.9 mg/dL (ref 0.6–1.0)
Globulin: 3.9 g/dL — ABNORMAL HIGH (ref 2.0–3.6)
Glucose: 139 mg/dL — ABNORMAL HIGH (ref 70–100)
Potassium: 4.3 mEq/L (ref 3.5–5.1)
Protein, Total: 8.2 g/dL (ref 6.0–8.3)
Sodium: 139 mEq/L (ref 136–145)

## 2013-11-28 LAB — LIPASE: Lipase: 3894 U/L — ABNORMAL HIGH (ref 8–78)

## 2013-11-28 LAB — CBC AND DIFFERENTIAL
Basophils Absolute Automated: 0.01 10*3/uL (ref 0.00–0.20)
Basophils Automated: 0 %
Eosinophils Absolute Automated: 0.08 10*3/uL (ref 0.00–0.70)
Eosinophils Automated: 0 %
Hematocrit: 46.6 % (ref 37.0–47.0)
Hgb: 15.5 g/dL (ref 12.0–16.0)
Immature Granulocytes Absolute: 0.08 10*3/uL — ABNORMAL HIGH
Immature Granulocytes: 0 %
Lymphocytes Absolute Automated: 0.89 10*3/uL (ref 0.50–4.40)
Lymphocytes Automated: 4 %
MCH: 29.5 pg (ref 28.0–32.0)
MCHC: 33.3 g/dL (ref 32.0–36.0)
MCV: 88.8 fL (ref 80.0–100.0)
MPV: 10.6 fL (ref 9.4–12.3)
Monocytes Absolute Automated: 1.14 10*3/uL (ref 0.00–1.20)
Monocytes: 5 %
Neutrophils Absolute: 18.81 10*3/uL — ABNORMAL HIGH (ref 1.80–8.10)
Neutrophils: 90 %
Platelets: 273 10*3/uL (ref 140–400)
RBC: 5.25 10*6/uL (ref 4.20–5.40)
RDW: 14 % (ref 12–15)
WBC: 20.93 10*3/uL — ABNORMAL HIGH (ref 3.50–10.80)

## 2013-11-28 LAB — GFR: EGFR: >60

## 2013-11-28 LAB — MAGNESIUM: Magnesium: 1.6 mg/dL (ref 1.6–2.6)

## 2013-11-28 LAB — HCG, SERUM, QUALITATIVE: Hcg Qualitative: NEGATIVE

## 2013-11-28 LAB — CELL MORPHOLOGY
Cell Morphology: NORMAL
Platelet Estimate: NORMAL

## 2013-11-28 LAB — CK: Creatine Kinase (CK): 74 U/L (ref 29–168)

## 2013-11-28 LAB — TROPONIN I: Troponin I: 0.02 ng/mL (ref 0.00–0.09)

## 2013-11-28 MED ORDER — IOHEXOL 350 MG/ML IV SOLN
100.0000 mL | Freq: Once | INTRAVENOUS | Status: AC | PRN
Start: 2013-11-28 — End: 2013-11-29
  Administered 2013-11-29: 100 mL via INTRAVENOUS

## 2013-11-28 MED ORDER — SODIUM CHLORIDE 0.9 % IV SOLN
INTRAVENOUS | Status: DC
Start: 2013-11-28 — End: 2013-11-30

## 2013-11-28 MED ORDER — SODIUM CHLORIDE 0.9 % IV BOLUS
1000.0000 mL | Freq: Once | INTRAVENOUS | Status: AC
Start: 2013-11-28 — End: 2013-11-29
  Administered 2013-11-28: 1000 mL via INTRAVENOUS

## 2013-11-28 MED ORDER — MORPHINE SULFATE 2 MG/ML IJ/IV SOLN (WRAP)
2.0000 mg | Freq: Once | Status: AC
Start: 2013-11-28 — End: 2013-11-29
  Administered 2013-11-29: 2 mg via INTRAVENOUS
  Filled 2013-11-28: qty 1

## 2013-11-28 MED ORDER — ONDANSETRON HCL 4 MG/2ML IJ SOLN
4.0000 mg | Freq: Once | INTRAMUSCULAR | Status: AC
Start: 2013-11-28 — End: 2013-11-28
  Administered 2013-11-28: 4 mg via INTRAVENOUS
  Filled 2013-11-28: qty 2

## 2013-11-28 NOTE — ED Notes (Signed)
Pt states developed nausea and vomiting after eating dinner tonight.  +mild diffuse abd pain

## 2013-11-28 NOTE — ED Provider Notes (Signed)
Physician/Midlevel provider first contact with patient: 11/28/13 2205         History     Chief Complaint   Patient presents with   . Emesis     HPI Comments: Pt with nausea and emesis with no heme since 1730- started on way to [employer] and then tried to eat and it got worse- no d/c, upper ap, no fever or dysuria, no back or chest pain, no sob, no other complaints  Onset after taking t#3 one hour prior for right upper toothache (int for months but has not taken t#3 in a while)    Patient is a 68 y.o. female presenting with vomiting. The history is provided by the patient.   Emesis  Severity:  Moderate  Duration:  5 hours  Timing:  Constant  Progression:  Unchanged  Chronicity:  New  Recent urination:  Normal  Relieved by:  Nothing  Worsened by:  Nothing tried  Ineffective treatments:  None tried  Associated symptoms: abdominal pain    Associated symptoms: no diarrhea, no fever, no headaches and no sore throat    Risk factors: no alcohol use, no sick contacts and no suspect food intake    Risk factors comment:  No sig nsaid use, no recent abx use      Past Medical History   Diagnosis Date   . Nerve pain      feet, behind eye       Past Surgical History   Procedure Laterality Date   . Appendectomy     . Hysterectomy     . Tonsilectomy, adenoidectomy, bilateral myringotomy and tubes     . Laparoscopic, cholecystectomy  04/01/2013     Procedure: LAPAROSCOPIC, CHOLECYSTECTOMY;  Surgeon: Jerel Shepherd, MD;  Location: [facility];  Service: General;  Laterality: N/A;       No family history on file.    Social  History   Substance Use Topics   . Smoking status: Never Smoker    . Smokeless tobacco: Not on file   . Alcohol Use: No       .     No Known Allergies    Current/Home Medications    ACETAMINOPHEN (TYLENOL) 500 MG TABLET    Take 1 tablet (500 mg total) by mouth every 4 (four) hours as needed.        Review of Systems   Constitutional: Positive for fatigue. Negative for fever and activity change.    HENT: Negative for congestion, rhinorrhea and sore throat.    Respiratory: Negative for cough and shortness of breath.    Cardiovascular: Negative for chest pain.   Gastrointestinal: Positive for vomiting and abdominal pain. Negative for nausea, diarrhea and constipation.   Genitourinary: Negative for dysuria.   Musculoskeletal: Negative for back pain and neck pain.        No trauma   Skin: Negative for color change and rash.   Neurological: Positive for weakness (general). Negative for syncope, light-headedness and headaches.   All other systems reviewed and are negative.      Physical Exam    BP: 156/69 mmHg, Heart Rate: 73, Temp: 99.4 F (37.4 C), Resp Rate: 16, SpO2: 94 %, Weight: 77.111 kg    Physical Exam   Constitutional: She is oriented to person, place, and time. She appears well-developed and well-nourished.  Non-toxic appearance. She does not have a sickly appearance. She does not appear ill. She appears distressed.   Elevated bp   HENT:  Head: Normocephalic and atraumatic.   Right Ear: External ear normal.   Left Ear: External ear normal.   Nose: Nose normal.   Mouth/Throat: Oropharynx is clear and moist and mucous membranes are normal.       Eyes: Conjunctivae, EOM and lids are normal. Pupils are equal, round, and reactive to light.   Neck: Trachea normal and full passive range of motion without pain. Neck supple. No spinous process tenderness present.   Cardiovascular: Normal rate, regular rhythm, normal heart sounds and normal pulses.    Pulmonary/Chest: Effort normal and breath sounds normal.   Abdominal: Soft. Normal appearance and bowel sounds are normal. She exhibits no distension. There is no tenderness. There is no rebound and no guarding.       Musculoskeletal: Normal range of motion.        Thoracic back: She exhibits normal range of motion, no tenderness, no bony tenderness and no pain.        Lumbar back: She exhibits normal range of motion, no tenderness, no bony tenderness and no pain.    Ext nt with full rom and nvi   Lymphadenopathy:     She has no cervical adenopathy.   Neurological: She is alert and oriented to person, place, and time. She has normal strength. No cranial nerve deficit or sensory deficit. GCS eye subscore is 4. GCS verbal subscore is 5. GCS motor subscore is 6.   Skin: Skin is warm, dry and intact. No rash noted.   Psychiatric: She has a normal mood and affect.   Vitals reviewed.      MDM and ED Course     ED Medication Orders    Start     Status Ordering Provider    11/29/13 0215  promethazine (PHENERGAN) injection 12.5 mg   Once     Route: Intravenous  Ordered Dose: 12.5 mg     Acknowledged Jahzier Villalon    11/28/13 2336  morphine injection 2 mg   Once     Route: Intravenous  Ordered Dose: 2 mg     Last MAR action:  Given Marilene Vath    11/28/13 2336  0.9%  NaCl infusion   Continuous     Route: Intravenous     Last MAR action:  New Bag Timonthy Hovater    11/28/13 2235  ondansetron (ZOFRAN) injection 4 mg   Once     Route: Intravenous  Ordered Dose: 4 mg     Last MAR action:  Given Kindred Heying    11/28/13 2208  sodium chloride 0.9 % bolus 1,000 mL   Once     Route: Intravenous  Ordered Dose: 1,000 mL     Last MAR action:  Stopped Ananth Fiallos           MDM  Number of Diagnoses or Management Options  Acute pancreatitis, unspecified pancreatitis type:   Elevated BP:   Elevated LFTs:   Elevated lipase:   Epigastric abdominal pain:   Hyperglycemia:   Leukocytosis:   Non-intractable vomiting with nausea, vomiting of unspecified type:   Renal cyst, left:   Diagnosis management comments: Emesis with no heme and epig ap- r/o sig path- ? Related to T#3- w/u, support, dispo pending    I, Michaelle Copas, MD, have been the primary provider for [name] during this Emergency Dept visit.    Oxygen saturation by pulse oximetry is 95%-100%, Normal.  Interventions: None Needed    EKG Interpretation:    Rhythm:  Normal Sinus  Rate:  Normal, 77  Axis:   Normal  Conduction:  No blocks  ST/T Segments:  Normal ST segments    Lactate, lip, ua, ct pending, support and dispo pending    Lact, ua, ct abd pending, support, med admit for pancreatitis pending    ua and ct abd pending, support, med admit pending    ua pending, support and med admit pending, abd exam still nt       Amount and/or Complexity of Data Reviewed  Clinical lab tests: reviewed and ordered  Tests in the radiology section of CPT: reviewed and ordered  Discuss the patient with other providers: yes (Dr Ricka Burdock to admit)          Procedures    Clinical Impression & Disposition     Clinical Impression  Final diagnoses:   Non-intractable vomiting with nausea, vomiting of unspecified type   Epigastric abdominal pain   Leukocytosis   Elevated BP   Hyperglycemia   Elevated LFTs   Elevated lipase   Acute pancreatitis, unspecified pancreatitis type   Renal cyst, left        ED Disposition    Admit Bed Type: Telemetry [5]  Admitting Physician: Dorthula Nettles [[id number]]  Patient Class: Inpatient [101]             New Prescriptions    No medications on file                 Michaelle Copas, MD  11/29/13 0225

## 2013-11-29 ENCOUNTER — Inpatient Hospital Stay: Payer: Medicare Other

## 2013-11-29 ENCOUNTER — Encounter: Payer: Self-pay | Admitting: Internal Medicine

## 2013-11-29 DIAGNOSIS — R7309 Other abnormal glucose: Secondary | ICD-10-CM

## 2013-11-29 DIAGNOSIS — R739 Hyperglycemia, unspecified: Secondary | ICD-10-CM | POA: Diagnosis present

## 2013-11-29 DIAGNOSIS — R7989 Other specified abnormal findings of blood chemistry: Secondary | ICD-10-CM

## 2013-11-29 DIAGNOSIS — R030 Elevated blood-pressure reading, without diagnosis of hypertension: Secondary | ICD-10-CM | POA: Diagnosis present

## 2013-11-29 DIAGNOSIS — R112 Nausea with vomiting, unspecified: Secondary | ICD-10-CM

## 2013-11-29 DIAGNOSIS — D72829 Elevated white blood cell count, unspecified: Secondary | ICD-10-CM

## 2013-11-29 DIAGNOSIS — K859 Acute pancreatitis without necrosis or infection, unspecified: Principal | ICD-10-CM

## 2013-11-29 LAB — LIPID PANEL
Cholesterol / HDL Ratio: 4.2
Cholesterol: 155 mg/dL (ref 0–199)
HDL: 37 mg/dL — ABNORMAL LOW (ref >40)
LDL Calculated: 93 mg/dL (ref 0–99)
Triglycerides: 127 mg/dL (ref 34–149)
VLDL Calculated: 25 mg/dL (ref 10–40)

## 2013-11-29 LAB — COMPREHENSIVE METABOLIC PANEL
ALT: 89 U/L — ABNORMAL HIGH (ref 0–55)
AST (SGOT): 87 U/L — ABNORMAL HIGH (ref 5–34)
Albumin/Globulin Ratio: 1.1 (ref 0.9–2.2)
Albumin: 3.5 g/dL (ref 3.5–5.0)
Alkaline Phosphatase: 78 U/L (ref 37–106)
Anion Gap: 7 (ref 5.0–15.0)
BUN: 11.4 mg/dL (ref 7.0–19.0)
Bilirubin, Total: 0.5 mg/dL (ref 0.2–1.2)
CO2: 26 mEq/L (ref 22–29)
Calcium: 8.8 mg/dL (ref 8.5–10.5)
Chloride: 106 mEq/L (ref 100–111)
Creatinine: 0.8 mg/dL (ref 0.6–1.0)
Globulin: 3.2 g/dL (ref 2.0–3.6)
Glucose: 131 mg/dL — ABNORMAL HIGH (ref 70–100)
Potassium: 4.5 mEq/L (ref 3.5–5.1)
Protein, Total: 6.7 g/dL (ref 6.0–8.3)
Sodium: 139 mEq/L (ref 136–145)

## 2013-11-29 LAB — CBC AND DIFFERENTIAL
Hematocrit: 42.2 % (ref 37.0–47.0)
Hgb: 13.7 g/dL (ref 12.0–16.0)
MCH: 28.7 pg (ref 28.0–32.0)
MCHC: 32.5 g/dL (ref 32.0–36.0)
MCV: 88.5 fL (ref 80.0–100.0)
MPV: 10.5 fL (ref 9.4–12.3)
Platelets: 257 10*3/uL (ref 140–400)
RBC: 4.77 10*6/uL (ref 4.20–5.40)
RDW: 14 % (ref 12–15)
WBC: 14.59 10*3/uL — ABNORMAL HIGH (ref 3.50–10.80)

## 2013-11-29 LAB — MAN DIFF ONLY
Band Neutrophils Absolute: 3.21 10*3/uL — ABNORMAL HIGH (ref 0.00–1.00)
Band Neutrophils: 22 %
Basophils Absolute Manual: 0 10*3/uL (ref 0.00–0.20)
Basophils Manual: 0 %
Eosinophils Absolute Manual: 0.15 10*3/uL (ref 0.00–0.70)
Eosinophils Manual: 1 %
Lymphocytes Absolute Manual: 0.29 10*3/uL — ABNORMAL LOW (ref 0.50–4.40)
Lymphocytes Manual: 2 %
Monocytes Absolute: 0.15 10*3/uL (ref 0.00–1.20)
Monocytes Manual: 1 %
Neutrophils Absolute Manual: 10.8 10*3/uL — ABNORMAL HIGH (ref 1.80–8.10)
Nucleated RBC: 0 /100 WBC (ref 0–1)
Segmented Neutrophils: 74 %

## 2013-11-29 LAB — HEMOLYSIS INDEX: Hemolysis Index: 43 — ABNORMAL HIGH (ref 0–18)

## 2013-11-29 LAB — ECG 12-LEAD
Atrial Rate: 77 {beats}/min
P Axis: 50 degrees
P-R Interval: 158 ms
Q-T Interval: 386 ms
QRS Duration: 88 ms
QTC Calculation (Bezet): 436 ms
R Axis: 56 degrees
T Axis: 48 degrees
Ventricular Rate: 77 {beats}/min

## 2013-11-29 LAB — LIPASE: Lipase: 748 U/L — ABNORMAL HIGH (ref 8–78)

## 2013-11-29 LAB — LACTIC ACID, PLASMA: Lactic Acid: 1.6 mmol/L (ref 0.5–2.2)

## 2013-11-29 LAB — GFR: EGFR: >60

## 2013-11-29 LAB — MAGNESIUM: Magnesium: 2 mg/dL (ref 1.6–2.6)

## 2013-11-29 LAB — PHOSPHORUS: Phosphorus: 2.7 mg/dL (ref 2.3–4.7)

## 2013-11-29 LAB — CELL MORPHOLOGY
Cell Morphology: NORMAL
Platelet Estimate: NORMAL

## 2013-11-29 LAB — PROCALCITONIN: Procalcitonin: 0.4 — ABNORMAL HIGH (ref 0.0–0.1)

## 2013-11-29 LAB — LACTATE DEHYDROGENASE: LDH: 209 U/L (ref 125–331)

## 2013-11-29 MED ORDER — HYDROMORPHONE HCL PF 1 MG/ML IJ SOLN
1.0000 mg | INTRAMUSCULAR | Status: DC | PRN
Start: 2013-11-29 — End: 2013-12-02

## 2013-11-29 MED ORDER — ONDANSETRON HCL 4 MG/2ML IJ SOLN
4.0000 mg | INTRAMUSCULAR | Status: DC | PRN
Start: 2013-11-29 — End: 2013-12-02

## 2013-11-29 MED ORDER — DIPHENHYDRAMINE HCL 50 MG/ML IJ SOLN
12.5000 mg | INTRAMUSCULAR | Status: DC | PRN
Start: 2013-11-29 — End: 2013-12-02

## 2013-11-29 MED ORDER — GADOBUTROL 1 MMOL/ML IV SOLN
10.0000 mL | Freq: Once | INTRAVENOUS | Status: AC | PRN
Start: 2013-11-29 — End: 2013-11-29
  Administered 2013-11-29: 10 mmol via INTRAVENOUS

## 2013-11-29 MED ORDER — ENOXAPARIN SODIUM 40 MG/0.4ML SC SOLN
40.0000 mg | Freq: Every day | SUBCUTANEOUS | Status: DC
Start: 2013-11-29 — End: 2013-12-02
  Administered 2013-11-29 – 2013-12-01 (×3): 40 mg via SUBCUTANEOUS
  Filled 2013-11-29 (×4): qty 0.4

## 2013-11-29 MED ORDER — PROMETHAZINE HCL 25 MG/ML IJ SOLN
12.5000 mg | INTRAMUSCULAR | Status: DC | PRN
Start: 2013-11-29 — End: 2013-12-02

## 2013-11-29 MED ORDER — PANTOPRAZOLE SODIUM 40 MG IV SOLR
40.0000 mg | Freq: Every day | INTRAVENOUS | Status: DC
Start: 2013-11-29 — End: 2013-12-02
  Administered 2013-11-29 – 2013-12-02 (×4): 40 mg via INTRAVENOUS
  Filled 2013-11-29 (×4): qty 40

## 2013-11-29 MED ORDER — PROMETHAZINE HCL 25 MG/ML IJ SOLN
12.5000 mg | Freq: Once | INTRAMUSCULAR | Status: AC
Start: 2013-11-29 — End: 2013-11-29
  Administered 2013-11-29: 12.5 mg via INTRAVENOUS
  Filled 2013-11-29: qty 1

## 2013-11-29 MED ORDER — SODIUM CHLORIDE 0.9 % IV MBP
1.0000 g | Freq: Two times a day (BID) | INTRAVENOUS | Status: DC
Start: 2013-11-29 — End: 2013-11-29
  Administered 2013-11-29: 1 g via INTRAVENOUS
  Filled 2013-11-29 (×2): qty 1000

## 2013-11-29 MED ORDER — IBUPROFEN 600 MG PO TABS
600.0000 mg | ORAL_TABLET | Freq: Once | ORAL | Status: AC
Start: 2013-11-29 — End: 2013-11-29
  Administered 2013-11-29: 600 mg via ORAL
  Filled 2013-11-29: qty 1

## 2013-11-29 MED ORDER — METRONIDAZOLE IN NACL 500 MG/100 ML IV SOLN
500.0000 mg | Freq: Three times a day (TID) | INTRAVENOUS | Status: DC
Start: 2013-11-29 — End: 2013-11-29
  Administered 2013-11-29: 500 mg via INTRAVENOUS
  Filled 2013-11-29: qty 100

## 2013-11-29 MED ORDER — SODIUM CHLORIDE 0.9 % IV MBP
1000.0000 mg | Freq: Three times a day (TID) | INTRAVENOUS | Status: DC
Start: 2013-11-29 — End: 2013-12-02
  Administered 2013-11-29 – 2013-12-02 (×9): 1000 mg via INTRAVENOUS
  Filled 2013-11-29 (×10): qty 1000

## 2013-11-29 NOTE — Plan of Care (Signed)
Problem: Safety  Goal: Patient will be free from injury during hospitalization  Outcome: Progressing  Bed in low position. Bed alarm on. Call bell within reach.  Intervention: Assess patient's risk for falls and implement fall prevention plan of care per policy  .  Intervention: Provide and maintain safe environment  .  Intervention: Use appropriate transfer methods  .  Intervention: Hourly rounding.  .      Problem: Pain  Goal: Patient's pain/discomfort is manageable  Outcome: Progressing  Pt denies pain at this time will continue to monitor    Comments:   Comments: patient arrived from ED to room 221 A. slightly drowsy but arousable. Fall precaution initiated. Oriented to room, encouraged to use call bell. Will continue to monitor.

## 2013-11-29 NOTE — ED Notes (Signed)
Pt in restroom collecting UA.  Became nauseated, episode of emesis.  Wheeled back to room.  Dr. Claiborne Rigg aware, to place orders.

## 2013-11-29 NOTE — Consults (Signed)
Service Date: 11/29/2013     Patient Type: I     CONSULTING PHYSICIAN: Alfonzo Beers MD     REFERRING PHYSICIAN: Diane L Traficante DO     REASON FOR CONSULTATION:  Pancreatitis, abdominal pain, leukocytosis.     HISTORY OF PRESENT ILLNESS:  [name] is a 68 year old female with past medical history  significant for gallstone pancreatitis, history of cholecystectomy, history  of appendectomy, hysterectomy, who was in her usual state of health in the  afternoon when she started feeling abdominal discomfort and nausea.  She  went for dinner and ordered food, but she started having vomiting along  with severe abdominal pain, and she was brought to the emergency room.  She  was having some dental infection for some time, and yesterday she took a  dose of amoxicillin which she had from before and some pain medicine, and  then in the afternoon she started having severe abdominal pain.  She came  to the emergency room and was found to have significant elevated lipase  level, elevated liver function test, and leukocytosis, started on cefepime  and Flagyl.  She had an MRI done this morning which did not reveal any  acute pathology, no cholelithiasis, no biliary stricture, no duct  dilatation, no liver masses.  She is feeling better as well.  Still admits  to have nausea, admits to have undocumented fevers.     REVIEW OF SYSTEMS:  Denies any chest pain, hematemesis, hemoptysis, melena, weight loss, night  sweats.  Other review of systems is noncontributory.     ALLERGIES:  No known drug allergies.     PAST MEDICAL HISTORY:  Gallstone pancreatitis, history of cholecystectomy, history of  hysterectomy, tonsillectomy.     FAMILY HISTORY:  Noncontributory.     SOCIAL HISTORY:  Lives with family.  No smoking or significant drinking.     TRAVEL HISTORY:  No recent overseas travel.     MEDICATIONS:  She was taking some Tylenol, and she took a dose of amoxicillin as well.     PHYSICAL EXAMINATION:  GENERAL:  [name] is a 68 year old female in no apparent  respiratory distress.  VITAL SIGNS:  Temperature, current is 97.9; pulse 80; respiratory rate 18;  blood pressure 125/58.  HEENT:  Pallor is negative.  Anicteric sclerae.  NECK:  Supple.  There is no thrush, moist mucous membranes.  RESPIRATORY:  Decreased breath sounds at bases.  No wheezes, no rales.  CARDIOVASCULAR:  S1, S2, tachycardic.  ABDOMEN:  Soft, bowel sounds are positive.  There is no visceromegaly.  NEUROLOGIC:  Alert, oriented x3.  Cranial nerves II through XII are intact.   There is no gross focal motor or sensory deficit.  SKIN AND EXTREMITIES:  There are no rashes.  PSYCHIATRIC:  Her mood and affect are appropriate.     DIAGNOSTIC STUDIES:  WBC count is 14.5, hemoglobin 13.7, hematocrit 42.2, platelet 257.  Her  initial WBC count was 20.9, neutrophils 90, segments 74, bands 22.  Glucose  131, BUN 11.4, creatinine 0.8, sodium 139, potassium 4.5, chloride 106, CO2  of 26, AST 87, ALT 89, alkaline phosphatase 78, lipase initially was 3894.   Her procalcitonin his 0.4.  Cultures are pending.  MRI of the abdomen  revealed status post cholecystectomy, no intrahepatic biliary dilatation,  mild dilated common bile duct, no choledocholithiasis, no stricture.  Chest  x-ray revealed atelectasis.     ASSESSMENT:  Lindsay Elliott is a 68 year old  female with:  1.  Pancreatitis with abdominal pain.  2.  Leukocytosis with fevers.  3.  Elevated liver function test.  4.  Dental infection.  5.  History of cholecystectomy, hysterectomy, and tonsillectomy.     RECOMMENDATIONS:  I would like to suggest the following approach:  1.  Meropenem 1 g intravenous q.8 hours.  2.  Discontinue cefepime and Flagyl.  3.  Start clear liquids.  4.  Repeat blood cultures if spikes more than 105.  5.  I discussed with the family.  6.  Discussed with Dr. Vladimir Faster.  7.  We will adjust her antibiotics based on her clinical course and  cultures.     I will follow this patient closely  with you.  Thank you, Diane, for  involving me in the care of Lindsay Elliott.           D:  11/29/2013 15:11 PM by Dr. Fredderick Phenix A. Janalyn Rouse, MD [phone])  T:  11/29/2013 16:08 PM by       Everlean Cherry: [id number]) (Doc ID: [id number])

## 2013-11-29 NOTE — Plan of Care (Signed)
Infectious Disease   Full Consult Dictated    11/29/2013   Lindsay Elliott [id number]:[id number] is a 68 y.o. female, with pancreatitis, leukocytosis, dental infection      Recommendations:  I would like to suggest the following approach:    Therapy     Meropenem      LABS:      CBC with Diff   CMP   Urine analysis    Repeat Blood cultures if Spike more than 100.5.     I will follow this patient Closely with you, Thank you Diane for Involving me in care of  Lindsay Elliott, M.D.,FACP  11/29/2013  2:34 PM

## 2013-11-29 NOTE — Treatment Plan (Addendum)
MD/RN Care Coordination Rounds    Date Time: 12/02/2013 2:48 PM  Patient Name: Lindsay Elliott, Lindsay Elliott  Room No: Z610/R604-V  Admit Date: 11/28/2013  Length of Stay: 4  Attending Physician: Marella Chimes, MD   Code Status:Full Code  Admit Status: Inpatient    Admitting RN will complete this section on day of admission, followed by RN for each shift with updates with the date before each comment in appropriate section. Please do not delete the prior comments made by other RN's. RN will round with attending physician outside the room to address concerns and physician will round in the patients room with RN to explain the treatment plan to the patient.     RN Report Items Please Date Each Entry   1. Update Status - Any overnight events?     2. Patient/family Goals/questions or concerns?          3. Abnormal Vitals?          4. Patient in pain? Is it well controlled?         5. Fluid and Food intake concerns?          6. Urine and Bowel habits concerns?          7. Issues Mental Status and ADLs?          8. Foley Catheter: If Yes, Order Present?    9. If Foley Present,  Removal Criteria Reviewed?          10. Central Line?    11. VTE prophylaxis?          12. Any abnormal labs? K, Mg, hb, Cr, LFT,?          13. RN concerns or questions?          14. Ask 3 Teach 3 done on new meds?    15. If no events past 24 hrs, please request Physician to discontinue telemetry monitor    16. Palliative Screen Positive?               HOSPITALIST will address any issues or concerns above with the RN before entering patients room and will update the on the following items.      Hospitalist Update Items Date/Initials-  rnandi 07/03  rnandi 07/04  rnandi 07/05  rnandi 07/06   1. Addressed Family/Pt/RN concerns or questions X   2. Informed Chief Complaint and Diagnosis  X   3. Informed Tests ordered/results and pending tests X   4. Informed New/Changed meds with Side Effects X   5. Informed Pain Control Plan X   6. Informed [place] Dispo/Criteria  and Approx. days required for [place] X     Attending Physician: Marella Chimes, MD   Date Time: 12/02/2013 2:48 PM    Vitals 24 hrs:   Filed Vitals:    12/02/13 0600 12/02/13 0720 12/02/13 1011 12/02/13 1329   BP:   118/56 127/56   Pulse:  51 65 76   Temp:   97 F (36.1 C) 97.2 F (36.2 C)   TempSrc:   Temporal Artery Temporal Artery   Resp:  16 16 20    Height:       Weight: 81.4 kg (179 lb 7.3 oz)      SpO2:   94% 95%        Readmission:   Admission on 03/30/2013, Discharged on 04/02/2013   Component Date Value Ref Range Status   . WBC 03/30/2013 11.60* 3.50 - 10.80 Final   . RBC  03/30/2013 4.79  4.20 - 5.40 Final   . Hgb 03/30/2013 14.3  12.0 - 16.0 g/dL Final   . Hematocrit 86/57/8469 42.4  37.0 - 47.0 % Final   . MCV 03/30/2013 88.5  80.0 - 100.0 fL Final   . MCH 03/30/2013 29.9  28.0 - 32.0 pg Final   . MCHC 03/30/2013 33.7  32.0 - 36.0 g/dL Final   . RDW 62/95/2841 14  12 - 15 % Final   . Platelets 03/30/2013 291  140 - 400 Final   . MPV 03/30/2013 10.5  9.4 - 12.3 fL Final   . Neutrophils 03/30/2013 72  None % Final   . Lymphocytes Automated 03/30/2013 21  None % Final   . Monocytes 03/30/2013 6  None % Final   . Eosinophils Automated 03/30/2013 1  None % Final   . Basophils Automated 03/30/2013 0  None % Final   . Immature Granulocyte 03/30/2013 0  None % Final   . Neutrophils Absolute 03/30/2013 8.41* 1.80 - 8.10 Final   . Abs Lymph Automated 03/30/2013 2.43  0.50 - 4.40 Final   . Abs Mono Automated 03/30/2013 0.65  0.00 - 1.20 Final   . Abs Eos Automated 03/30/2013 0.10  0.00 - 0.70 Final   . Absolute Baso Automated 03/30/2013 0.01  0.00 - 0.20 Final   . Absolute Immature Granulocyte 03/30/2013 0.02  0 Final   . Glucose 03/30/2013 115* 70 - 100 mg/dL Final    Comment: Interpretive Data for Adult Female and Female Population                           Indeterminate Range:  100-125 mg/dL                           Equal to or greater than 126 mg/dL meets the ADA                           guidelines for Diabetes  Mellitus diagnosis if symptoms                           are present and confirmed by repeat testing.                           Random (Non-Fasting)Interpretive Data (Adults):                           Equal to or greater than 200 mg/dL meets the ADA                           guidelines for Diabetes Mellitus diagnosis if symptoms                           are present and confirmed by Fasting Glucose or GTT.   . BUN 03/30/2013 9.3  7.0 - 19.0 mg/dL Final   . Creatinine 32/44/0102 0.8  0.6 - 1.0 mg/dL Final   . Sodium 72/53/6644 140  136 - 145 Final   . Potassium 03/30/2013 4.3  3.5 - 5.1 Final    Specimen slightly hemolyzed   . Chloride 03/30/2013 107  98 - 107 Final   . CO2 03/30/2013 20*  22 - 29 Final   . CALCIUM 03/30/2013 9.5  8.5 - 10.5 mg/dL Final   . Protein, Total 03/30/2013 7.7  6.0 - 8.3 g/dL Final   . Albumin 56/21/3086 3.9  3.5 - 5.0 g/dL Final   . AST (SGOT) 57/84/6962 197* 5 - 34 U/L Final   . ALT 03/30/2013 256* 0 - 55 U/L Final   . Alkaline Phosphatase 03/30/2013 97  40 - 150 U/L Final   . Bilirubin, Total 03/30/2013 0.6  0.2 - 1.2 mg/dL Final   . Globulin 95/28/4132 3.8* 2.0 - 3.6 g/dL Final   . Albumin/Globulin Ratio 03/30/2013 1.0  0.9 - 2.2 Final   . Anion Gap 03/30/2013 13.0  5.0 - 15.0 Final   . Lipase 03/30/2013 844* 8 - 78 U/L Final   . Urine Type 03/30/2013 Clean Catch   Final   . Color, UA 03/30/2013 YELLOW  Clear - Yellow Final   . Clarity, UA 03/30/2013 SLIGHTLY CLOUDY  Clear - Hazy Final   . Specific Gravity UA 03/30/2013 1.010  1.001-1.035 Final   . Urine pH 03/30/2013 6.0  5.0-8.0 Final   . Leukocyte Esterase, UA 03/30/2013 TRACE* Negative Final   . Nitrite, UA 03/30/2013 NEGATIVE  Negative Final   . Protein, UR 03/30/2013 NEGATIVE  Negative Final   . Glucose, UA 03/30/2013 NEGATIVE  Negative Final   . Ketones UA 03/30/2013 NEGATIVE  Negative Final   . Urobilinogen, UA 03/30/2013 0.2  0.2-2.0 mg/dL Final   . Bilirubin, UA 03/30/2013 NEGATIVE  Negative Final   . Blood, UA 03/30/2013  NEGATIVE  Negative Final   . EGFR 03/30/2013 >60.0   Final    Comment: Disease State Reference Ranges:                             Chronic Kidney Disease; < 60 ml/min/1.73 sq.m                             Kidney Failure; < 15 ml/min/1.73 sq.m                             [Calculated using IDMS-Traceable MDRD equation (based on                             gender, age and black vs. non-black race) recommended by                             [employer] Disease Education Program. No data                             available for non-white, non-black race.]                           GFR estimates are unreliable in patients with:                             Rapidly changing kidney function or recent dialysis,                             extreme age, body  size or body composition(obesity,                             severe malnutrition). Abnormal muscle mass (limb                             amputation, muscle wasting). In these patients,                             alternative determinations of GFR should be obtained.   . Lactic acid 03/30/2013 1.4  0.7 - 2.1 mmol/L Final   . RBC, UA 03/30/2013 None Seen  0 - 5 Final   . WBC, UA 03/30/2013 11 - 25* 0 - 5 Final   . Squamous Epithelial Cells, Urine 03/30/2013 0 - 5  0 - 25 Final   . Urine Bacteria 03/30/2013 Few* Rare Final   . Magnesium 03/31/2013 1.7  1.6 - 2.6 mg/dL Final   . Glucose 88/41/6606 103* 70 - 100 mg/dL Final    Comment: Interpretive Data for Adult Female and Female Population                           Indeterminate Range:  100-125 mg/dL                           Equal to or greater than 126 mg/dL meets the ADA                           guidelines for Diabetes Mellitus diagnosis if symptoms                           are present and confirmed by repeat testing.                           Random (Non-Fasting)Interpretive Data (Adults):                           Equal to or greater than 200 mg/dL meets the ADA                           guidelines for Diabetes  Mellitus diagnosis if symptoms                           are present and confirmed by Fasting Glucose or GTT.   . BUN 03/31/2013 6.9* 7.0 - 19.0 mg/dL Final   . Creatinine 30/16/0109 0.8  0.6 - 1.0 mg/dL Final   . Sodium 32/35/5732 141  136 - 145 Final   . Potassium 03/31/2013 4.1  3.5 - 5.1 Final   . Chloride 03/31/2013 109* 98 - 107 Final   . CO2 03/31/2013 24  22 - 29 Final   . CALCIUM 03/31/2013 8.6  8.5 - 10.5 mg/dL Final   . Protein, Total 03/31/2013 6.4  6.0 - 8.3 g/dL Final   . Albumin 20/25/4270 3.2* 3.5 - 5.0 g/dL Final   . AST (SGOT) 62/37/6283 130* 5 - 34 U/L Final   . ALT 03/31/2013 198* 0 - 55 U/L Final   .  Alkaline Phosphatase 03/31/2013 86  40 - 150 U/L Final   . Bilirubin, Total 03/31/2013 0.5  0.2 - 1.2 mg/dL Final   . Globulin 54/01/8118 3.2  2.0 - 3.6 g/dL Final   . Albumin/Globulin Ratio 03/31/2013 1.0  0.9 - 2.2 Final   . Anion Gap 03/31/2013 8.0  5.0 - 15.0 Final   . WBC 03/31/2013 9.50  3.50 - 10.80 Final   . RBC 03/31/2013 4.41  4.20 - 5.40 Final   . Hgb 03/31/2013 12.9  12.0 - 16.0 g/dL Final   . Hematocrit 14/78/2956 39.5  37.0 - 47.0 % Final   . MCV 03/31/2013 89.6  80.0 - 100.0 fL Final   . MCH 03/31/2013 29.3  28.0 - 32.0 pg Final   . MCHC 03/31/2013 32.7  32.0 - 36.0 g/dL Final   . RDW 21/30/8657 14  12 - 15 % Final   . Platelets 03/31/2013 259  140 - 400 Final   . MPV 03/31/2013 10.6  9.4 - 12.3 fL Final   . Neutrophils 03/31/2013 64  None % Final   . Lymphocytes Automated 03/31/2013 27  None % Final   . Monocytes 03/31/2013 6  None % Final   . Eosinophils Automated 03/31/2013 2  None % Final   . Basophils Automated 03/31/2013 0  None % Final   . Immature Granulocyte 03/31/2013 0  None % Final   . Neutrophils Absolute 03/31/2013 6.09  1.80 - 8.10 Final   . Abs Lymph Automated 03/31/2013 2.55  0.50 - 4.40 Final   . Abs Mono Automated 03/31/2013 0.61  0.00 - 1.20 Final   . Abs Eos Automated 03/31/2013 0.23  0.00 - 0.70 Final   . Absolute Baso Automated 03/31/2013 0.02  0.00 - 0.20 Final    . Absolute Immature Granulocyte 03/31/2013 0.02  0 Final   . Cholesterol 03/31/2013 159  0 - 199 mg/dL Final   . Triglycerides 03/31/2013 94  34 - 149 mg/dL Final   . HDL 84/69/6295 38* >40 mg/dL Final    Comment: HDL:                                Less than 40 mg/dL - Major risk heart disease                                Greater than or equal to 60 mg/dL - Negative risk factor                                for heart disease   . LDL Calculated 03/31/2013 284* 0 - 99 mg/dL Final   . VLDL Cholesterol Cal 03/31/2013 19  10 - 40 mg/dL Final   . CHOL/HDL Ratio 03/31/2013 4.2  See Below Final    Comment: Chol/HDL Ratio:                           Classification                   Female     Female                           Very Low (1/2 Average Risk)      <  3.4     <3.3                           Low Risk                         4.0      3.8                           Average Risk                     5.0      4.5                           Moderate Risk (2X Average risk)  9.5      7.0                           High Risk (3X Average Risk)      >23.0    >11.0   . Hemolysis Index 03/31/2013 7  0 - 9 Final   . EGFR 03/31/2013 >60.0   Final    Comment: Disease State Reference Ranges:                             Chronic Kidney Disease; < 60 ml/min/1.73 sq.m                             Kidney Failure; < 15 ml/min/1.73 sq.m                             [Calculated using IDMS-Traceable MDRD equation (based on                             gender, age and black vs. non-black race) recommended by                             [employer] Kidney Disease Education Program. No data                             available for non-white, non-black race.]                           GFR estimates are unreliable in patients with:                             Rapidly changing kidney function or recent dialysis,                             extreme age, body size or body composition(obesity,                             severe malnutrition). Abnormal muscle  mass (limb  amputation, muscle wasting). In these patients,                             alternative determinations of GFR should be obtained.   . Ventricular Rate 03/31/2013 56   Final   . Atrial Rate 03/31/2013 56   Final   . P-R Interval 03/31/2013 166   Final   . QRS Duration 03/31/2013 88   Final   . Q-T Interval 03/31/2013 432   Final   . QTC Calculation (Bezet) 03/31/2013 416   Final   . P Axis 03/31/2013 46   Final   . R Axis 03/31/2013 35   Final   . T Axis 03/31/2013 24   Final   . Glucose 04/01/2013 95  70 - 100 mg/dL Final    Comment: Interpretive Data for Adult Female and Female Population                           Indeterminate Range:  100-125 mg/dL                           Equal to or greater than 126 mg/dL meets the ADA                           guidelines for Diabetes Mellitus diagnosis if symptoms                           are present and confirmed by repeat testing.                           Random (Non-Fasting)Interpretive Data (Adults):                           Equal to or greater than 200 mg/dL meets the ADA                           guidelines for Diabetes Mellitus diagnosis if symptoms                           are present and confirmed by Fasting Glucose or GTT.   . BUN 04/01/2013 4.7* 7.0 - 19.0 mg/dL Final   . Creatinine 40/98/1191 0.7  0.6 - 1.0 mg/dL Final   . Sodium 47/82/9562 143  136 - 145 Final   . Potassium 04/01/2013 4.1  3.5 - 5.1 Final   . Chloride 04/01/2013 113* 98 - 107 Final   . CO2 04/01/2013 23  22 - 29 Final   . CALCIUM 04/01/2013 8.1* 8.5 - 10.5 mg/dL Final   . Protein, Total 04/01/2013 5.8* 6.0 - 8.3 g/dL Final   . Albumin 13/12/6576 3.0* 3.5 - 5.0 g/dL Final   . AST (SGOT) 46/96/2952 47* 5 - 34 U/L Final   . ALT 04/01/2013 118* 0 - 55 U/L Final   . Alkaline Phosphatase 04/01/2013 71  40 - 150 U/L Final   . Bilirubin, Total 04/01/2013 0.4  0.2 - 1.2 mg/dL Final   . Globulin 84/13/2440 2.8  2.0 - 3.6 g/dL Final   . Albumin/Globulin Ratio  04/01/2013 1.1  0.9 - 2.2 Final   .  Anion Gap 04/01/2013 7.0  5.0 - 15.0 Final   . WBC 04/01/2013 7.52  3.50 - 10.80 Final   . RBC 04/01/2013 4.13* 4.20 - 5.40 Final   . Hgb 04/01/2013 12.0  12.0 - 16.0 g/dL Final   . Hematocrit 16/02/9603 37.2  37.0 - 47.0 % Final   . MCV 04/01/2013 90.1  80.0 - 100.0 fL Final   . MCH 04/01/2013 29.1  28.0 - 32.0 pg Final   . MCHC 04/01/2013 32.3  32.0 - 36.0 g/dL Final   . RDW 54/01/8118 14  12 - 15 % Final   . Platelets 04/01/2013 236  140 - 400 Final   . MPV 04/01/2013 10.4  9.4 - 12.3 fL Final   . Neutrophils 04/01/2013 57  None % Final   . Lymphocytes Automated 04/01/2013 28  None % Final   . Monocytes 04/01/2013 7  None % Final   . Eosinophils Automated 04/01/2013 7  None % Final   . Basophils Automated 04/01/2013 0  None % Final   . Immature Granulocyte 04/01/2013 0  None % Final   . Neutrophils Absolute 04/01/2013 4.31  1.80 - 8.10 Final   . Abs Lymph Automated 04/01/2013 2.13  0.50 - 4.40 Final   . Abs Mono Automated 04/01/2013 0.54  0.00 - 1.20 Final   . Abs Eos Automated 04/01/2013 0.52  0.00 - 0.70 Final   . Absolute Baso Automated 04/01/2013 0.02  0.00 - 0.20 Final   . Absolute Immature Granulocyte 04/01/2013 0.01  0 Final   . Lipase 04/01/2013 24  8 - 78 U/L Final   . EGFR 04/01/2013 >60.0   Final    Comment: Disease State Reference Ranges:                             Chronic Kidney Disease; < 60 ml/min/1.73 sq.m                             Kidney Failure; < 15 ml/min/1.73 sq.m                             [Calculated using IDMS-Traceable MDRD equation (based on                             gender, age and black vs. non-black race) recommended by                             [employer] Disease Education Program. No data                             available for non-white, non-black race.]                           GFR estimates are unreliable in patients with:                             Rapidly changing kidney function or recent dialysis,                              extreme  age, body size or body composition(obesity,                             severe malnutrition). Abnormal muscle mass (limb                             amputation, muscle wasting). In these patients,                             alternative determinations of GFR should be obtained.        Coagulation Profile: No results for input(s): PT, INR, PTT, APTT in the last 168 hours.       Medications:   Current Facility-Administered Medications   Medication Dose Route Frequency Last Rate Last Dose   . albuterol (PROVENTIL) nebulizer solution 2.5 mg  2.5 mg Nebulization Q4H SCH   2.5 mg at 12/02/13 1113   . azithromycin (ZITHROMAX) 500 mg in sodium chloride 0.9 % 250 mL IVPB  500 mg Intravenous Q24H SCH 250 mL/hr at 12/02/13 1013 500 mg at 12/02/13 1013   . diphenhydrAMINE (BENADRYL) injection 12.5 mg  12.5 mg Intravenous Q4H PRN       . enoxaparin (LOVENOX) syringe 40 mg  40 mg Subcutaneous Daily   40 mg at 12/01/13 1020   . HYDROmorphone (DILAUDID) injection 1 mg  1 mg Intravenous Q2H PRN       . ibuprofen (ADVIL,MOTRIN) tablet 400 mg  400 mg Oral Q6H PRN   400 mg at 11/30/13 1112   . meropenem (MERREM) 1,000 mg in sodium chloride 0.9 % 100 mL IVPB mini-bag plus  1,000 mg Intravenous Q8H 200 mL/hr at 12/02/13 0803 1,000 mg at 12/02/13 0803   . methylprednisolone sodium succinate (Solu-MEDROL) injection 40 mg  40 mg Intravenous Q8H SCH   40 mg at 12/02/13 0513   . ondansetron (ZOFRAN) injection 4 mg  4 mg Intravenous Q4H PRN       . pantoprazole (PROTONIX) injection 40 mg  40 mg Intravenous Daily   40 mg at 12/02/13 1013   . promethazine (PHENERGAN) injection 12.5 mg  12.5 mg Intravenous Q4H PRN            CBC review:     Recent Labs  Lab 12/02/13  0528 12/01/13  0607 11/29/13  0644 11/28/13  2240   WBC 9.98 8.35 14.59* 20.93*   HEMOGLOBIN 12.5 12.2 13.7 15.5   HEMATOCRIT 38.0 37.5 42.2 46.6   PLATELETS 256 211 257 273   MCV 88.2 89.5 88.5 88.8   RDW 14 14 14 14    NEUTROPHILS 84 48 74 90   LYMPHOCYTES AUTOMATED 15 34   --  4   EOSINOPHILS AUTOMATED 0 10  --  0   IMMATURE GRANULOCYTE 1 0  --  0   NEUTROPHILS ABSOLUTE 8.33* 3.97  --  18.81*   ABSOLUTE IMMATURE GRANULOCYTE 0.06* 0.03  --  0.08*        Chem Review:    Recent Labs  Lab 12/02/13  0528 12/01/13  0607 11/30/13  0808 11/29/13  0644 11/28/13  2240   SODIUM 139 142 141 139 139   POTASSIUM 3.6 3.6 4.8 4.5 4.3   CHLORIDE 105 110 113* 106 104   CO2 25 27 22 26 23    BUN 7.6 8.0 8.4 11.4 13.1   CREATININE 0.7 0.7  0.8 0.8 0.9   GLUCOSE 158* 93 83 131* 139*   CALCIUM 8.5 8.1* 7.8* 8.8 10.1   MAGNESIUM  --   --   --  2.0 1.6   PHOSPHORUS  --   --   --  2.7  --    BILIRUBIN, TOTAL 0.2 0.3 0.3 0.5 0.5   AST (SGOT) 48* 27 44* 87* 120*   ALT 36 33 49 89* 83*   ALKALINE PHOSPHATASE 67 62 63 78 90

## 2013-11-29 NOTE — H&P (Signed)
ADMISSION HISTORY AND PHYSICAL EXAM    Date Time: 11/29/2013 6:32 AM  Patient Name: Lindsay Elliott  Attending Physician: Dorthula Nettles, MD  Primary Care Physician: Gregor Hams, MD (General)    Assessment and Plan:   Acute pancreatitis with abdominal pain:  Her lipase is 3894 and she has mild elevation of her examination is.  Denies alcohol use.  Her white count is elevated at 20,000, and her lactate is normal at 1.6.  Nothing by mouth.  Aggressive IV hydration.  Bowel rest.  IV pain control.  Will consult GI later this morning.  MRCP has been ordered but not done at time of this dictation.  We will check triglyceride level.    Leukocytosis:  He has a 20,000 white count and had subjective fevers and chills.  Empiric Maxipime and Flagyl for now.  Dr. Mosie Epstein, infectious disease has been consulted.  We will check pro-calcitonin and if negative, can likely discontinue antibiotics if okay with infectious disease.    Elevated transaminases:  See above.  Follow trends.    Elevated blood pressure without diagnosis of hypertension:  This is likely due to pain.  We will utilize PRN IV beta blocker and hydralazine with parameters.    Hyperglycemia:  This is likely due to stress reaction and acute pancreatitis.  Follow trends and check hemoglobin A1c to rule out an occult diabetic state    VTE prophylaxis with Lovenox.    GI prophylaxis with IV Protonix.    Code Status: Full Code     Disposition:  Inpatient admission.  She has acute pancreatitis with epigastric pain and lipase of 3894.  She has marked leukocytosis and it will take more than 2 days to stabilize her symptoms.       CC:    68 y.o. female who presents to the hospital with abdominal pain, nausea, vomiting.    History of Presenting Illness:   This 68 year old female is extremely sleepy due to recent IV narcotics for pain.  She does arouse and answer my questions with yes or no answers.  History was obtained through her, the ED record and  my review of her medical records in Epic.  Last evening around 1730.  She was eating dinner at [employer].  She developed an acute onset of 10/10, constant, sharp, colicky epigastric abdominal pain with radiation through to the back, nausea, vomiting and feeling unwell.  Since onset of her symptoms, she has had subjective fevers, chills/rigors and sweating.  She has a history of gallstone pancreatitis November 2014 and is status post laparoscopic cholecystectomy.  Current symptoms are similar to those symptoms.  Denies constipation, diarrhea, urinary tract infection symptoms, lower abdominal pain, chest pain or discomfort, shortness of breath.  She took 1 Tylenol No. 3 about an hour prior to her symptoms for a right upper tooth ache, (has not taken Tylenol 3 for a while).  It was noted in the emergency department.  Her lipase was greater than 3000 and abdominal pelvic CTA did not show acute changes.  Her pain is currently 0/10.    Past Medical History:     Diagnosis Date   . Nerve pain, feet, retro-orbital     . Gallstone pancreatitis; S/P Lap Chole 03/31/2013         Past Surgical History:     Procedure Laterality Date   . Laparoscopic appendectomy  02/09/2010     Ruptured appendix   . Hysterectomy     . Tonsilectomy, adenoidectomy, bilateral  myringotomy and tubes     . Laparoscopic, cholecystectomy  04/01/2013     For Gallstone pancreatitis       Family History:   No family history of pancreatitis or gastrointestinal disease.    Social History:     Marland Kitchen Marital Status: Married     . Smoking status: Never Smoker    . Smokeless tobacco: No   . Alcohol Use: No   . Drug Use: No       Allergies:   No Known Allergies    Medications:     Prescriptions prior to admission   Medication Sig   . acetaminophen (TYLENOL) 500 MG tablet Take 1 tablet (500 mg total) by mouth every 4 (four) hours as needed.     Review of Systems:   Constitutional: Has had subjective fevers, chills/rigors, and sweats since acute illness  started.  HEENT: denies visual disturbances, otalgia, sore throat, sinus symptoms, tinnitus  Cardiovascular:  denies chest pain or discomfort, palpitations.   Respiratory: denies shortness of breath, DOE, PND, orthopnea, cough  Endocrine:  denies heat or cold intolerance  Hematologic: denies easy bruising  Gastrointestinal: denies diarrhea, constipation, melena, hematochezia, hematemesis.  See HPI.  Genitourinary: denies UTI symptoms or flank pain.  Musculoskeletal: denies arthralgias, myalgias  Neurologic: denies headache, vertigo  Psychiatric: denies anxiety or depression  Skin: denies rash    Prior Diagnostics:   N/A.    Physical Exam:     Filed Vitals:    11/29/13 0502   BP: 123/60   Pulse: 85   Temp: 98.1 F (36.7 C)   Resp: 19   SpO2: 91%     General: Well developed, well nourished , nontoxic, elderly female in no acute distress.  She is very sleepy after receiving IV narcotics for pain but is arousable and answers my questions.  HEENT: PERRLA, EOMI. Sclerae nonicteric, conjunctivae clear. Lips are pale and dry.  Neck: Supple. Nontender. Full range of motion. No lymphadenopathy, nuchal rigidity, JVD or carotid bruit.  Cardiovascular: regular rate and rhythm, S1 S2. No murmur, rub or gallop.  Chest: Nontender to palpation or compression.  Lungs: clear to auscultation, decreased at bases due to shallow breathing. Nonlabored respirations.  Abdomen: soft, moderate epigastric tenderness with voluntary guarding but no rebound or rigidity. Non-distended; hypoactive bowel sounds.  Back: No CVA or spinal tenderness.  Extremities: no clubbing, cyanosis, or edema. Normal muscle strength and tone.  Neuro: Sleeping but arousable.  Oriented 3.  No focal findings.  Skin: Cool, dry because membranes dry.  Marked pallor.  No rash.  Psych: Poor eye contact.  Flat affect.  Soft speech. Well groomed.      Labs and Imaging:      Ref. Range 11/28/2013 22:40   WBC Latest Range: 3.50-10.80 x10 3/uL 20.93 (H)   Hemoglobin Latest  Range: 11.7-15.5 g/dL 16.1   Hematocrit Latest Range: 37.0-47.0 % 46.6   Platelet Count Latest Range: 150-400 K/uL 273   RBC Latest Range: 4.20-5.40 x10 6/uL 5.25   MCV Latest Range: 80.0-100.0 fL 88.8   MCH, POC Latest Range: 28.0-32.0 pg 29.5   MCHC Latest Range: 32.0-36.0 g/dL 09.6   RDW Latest Range: 12-15 % 14   MPV Latest Range: 9.0-13.0 fL 10.6      Ref. Range 11/28/2013 22:40   Glucose Latest Range: 70-100 mg/dL 045 (H)   BUN Latest Range: 7.0-19.0 mg/dL 40.9   Creatinine Latest Range: 0.6-1.0 mg/dL 0.9   Sodium Latest Range: 136-145 mEq/L 139  Potassium Latest Range: 3.5-5.1 mEq/L 4.3   Chloride Latest Range: 100-111 mEq/L 104   CO2 Latest Range: 22-29 mEq/L 23   Anion Gap Latest Range: 5.0-15.0  12.0   Calcium Latest Range: 8.5-10.5 mg/dL 03.4   Magnesium Latest Range: 1.6-2.6 mg/dL 1.6   EGFR No range found >60.0   AST (SGOT) Latest Range: 5-34 U/L 120 (H)   ALT Latest Range: 0-55 U/L 83 (H)   Alkaline Phosphatase Latest Range: 37-106 U/L 90   Albumin Latest Range: 3.5-5.0 g/dL 4.3   Protein, Total Latest Range: 6.3-8.2 g/dL 8.2   Globulin Latest Range: 2.0-3.6 g/dL 3.9 (H)   Albumin/Globulin Ratio Latest Range: 0.9-2.2  1.1   Bilirubin, Total Latest Range: 0.2-1.2 mg/dL 0.5   Lipase Latest Range: 8-78 U/L 3894 (H)   Troponin I Latest Range: 0.00-0.09 ng/mL 0.02   Creatine Kinase (CK) Latest Range: 29-168 U/L 74   BHCG Qual Latest Range: Negative  Negative   Lactic acid Latest Range: 0.5-2.2 mmol/L 1.6        Portable chest x-ray:  HISTORY: Abdominal pain.   COMPARISON: No prior chest imaging. CT abdomen from 03/30/2013.  Portable chest radiograph. The heart is stable and top normal size. The mediastinal contour is normal. There is no pneumothorax or pleural effusion. Patchy lung opacities are noted in the medial lung bases bilaterally, with mild associated volume loss. There is no pulmonary edema.  IMPRESSION:  Patchy lung opacities in the medial lung bases bilaterally, with mild associated volume loss,  suggesting lower lobe atelectasis.      Abdominal pelvic CT:  INDICATION: upper ap and emesis. 68 year old female with history of appendectomy, cholecystectomy and hysterectomy.   COMPARISON: CT abdomen pelvis 03/30/2013 and 02/09/2010.   TECHNIQUE: CT ABDOMEN PELVIS W IV/ WO PO CONT: Helical imaging was performed on a multislice CT scanner without oral contrast. Axial scans were obtained from the xiphoid through the symphysis pubis. Dynamic imaging was done during injection of 100 cc Omnipaque 350.  Acquisition phase of imaging: Portal phase of contrast enhancement. Reconstructions: MPR Coronal and sagittal.   FINDINGS: The lung bases are clear except for minimal atelectasis.  A probable left renal cyst is noted. The liver, spleen, pancreas, adrenal glands, and kidneys show no other apparent masses. The 12 mm hypodense lesion in the midpole left kidney is too small to precisely characterize but likely represents a cyst; it is only slightly larger than in 2011. The gallbladder has been removed. The bile ducts are nondilated. The portal vein, SMV and other central mesenteric vessels are patent. There is no CT evidence of pancreatitis. The kidneys show no calculi, hydronephrosis or findings of pyelonephritis.  The abdominal aorta is calcified but not aneurysmal. There is no  retroperitoneal adenopathy, hemorrhage or mass. There is no bowel dilatation or definite wall thickening. No mesenteric abnormality  is seen. There is no intraperitoneal free air or ascites. The uterus has been removed. There is no appreciable adnexal mass. The  urinary bladder is unremarkable. The abdominal wall soft tissues are unremarkable. Spinal degenerative changes are noted.   IMPRESSION:  CT scan of the abdomen and pelvis shows no acute abnormality.  Incidental note is made of: Cholecystectomy, hysterectomy and a probable small left renal cyst. Other findings as noted above.       EKG (read by me): Sinus rhythm 76 BPM.  Normal axis and  intervals.  Insignificant Q waves inferior leads.  No ST-T wave abnormalities.  Normal EKG.  Unchanged from 2014 EKG.  Signed by: Stefano Gaul, DO  LK:[id number], Donetta Potts, MD (General)

## 2013-11-29 NOTE — Plan of Care (Signed)
Problem: Safety  Goal: Patient will be free from injury during hospitalization  Outcome: Progressing  Bed in low position. Bed alarm on. Call bell within reach.   Intervention: Provide and maintain safe environment  .       Problem: Pain  Goal: Patient's pain/discomfort is manageable  Outcome: Progressing  Pt denies pain at this time. Will continue to monitor     Problem: Psychosocial and Spiritual Needs  Goal: Demonstrates ability to cope with hospitalization/illness  Outcome: Progressing

## 2013-11-29 NOTE — Progress Notes (Signed)
Seen by Dr. Mady Haagensen this morning on November 29, 2013  Please see history and physical for more details.  Reviewed history and physical  Complaints of abdominal pain, no nausea, vomiting, diarrhea.  Exam  Not in acute distress  Neck supple.  No thyromegaly  Chest clear to auscultation bilaterally  CVS no murmurs.  S1, S2 present  Abdomen mild tenderness in the epigastric and left upper quadrant of abdomen.  Bowel sounds present, soft, no organomegaly noted  Neurologic no gross focal neurological deficits    Assessment and plan  Acute pancreatitis with abdominal pain.  No evidence of gallstones noted, not an alcoholic.  Lipase trending down  Aggressive IV hydration to be continued with bowel rest and IV pain medication when necessary  MRCP showed no cholelithiasis or pancreatic tumor.  Continue empirically IV antibiotics, meropenem as she does have subjective fevers with high elevated white count.  Dr. Delbert Phenix on board  Hyperglycemia, most likely secondary to pancreatitis.  Closely monitor blood sugar trends     continue with above  Management    Signed,  Dr. Vladimir Faster  [phone]

## 2013-11-30 ENCOUNTER — Inpatient Hospital Stay: Payer: Medicare Other

## 2013-11-30 LAB — COMPREHENSIVE METABOLIC PANEL
ALT: 49 U/L (ref 0–55)
AST (SGOT): 44 U/L — ABNORMAL HIGH (ref 5–34)
Albumin/Globulin Ratio: 0.9 (ref 0.9–2.2)
Albumin: 3 g/dL — ABNORMAL LOW (ref 3.5–5.0)
Alkaline Phosphatase: 63 U/L (ref 37–106)
Anion Gap: 6 (ref 5.0–15.0)
BUN: 8.4 mg/dL (ref 7.0–19.0)
Bilirubin, Total: 0.3 mg/dL (ref 0.2–1.2)
CO2: 22 mEq/L (ref 22–29)
Calcium: 7.8 mg/dL — ABNORMAL LOW (ref 8.5–10.5)
Chloride: 113 mEq/L — ABNORMAL HIGH (ref 100–111)
Creatinine: 0.8 mg/dL (ref 0.6–1.0)
Globulin: 3.4 g/dL (ref 2.0–3.6)
Glucose: 83 mg/dL (ref 70–100)
Potassium: 4.8 mEq/L (ref 3.5–5.1)
Protein, Total: 6.4 g/dL (ref 6.0–8.3)
Sodium: 141 mEq/L (ref 136–145)

## 2013-11-30 LAB — LIPASE: Lipase: 37 U/L (ref 8–78)

## 2013-11-30 LAB — GFR: EGFR: >60

## 2013-11-30 MED ORDER — IBUPROFEN 400 MG PO TABS
400.0000 mg | ORAL_TABLET | Freq: Four times a day (QID) | ORAL | Status: DC | PRN
Start: 2013-11-30 — End: 2013-12-02
  Administered 2013-11-30: 400 mg via ORAL
  Filled 2013-11-30: qty 1

## 2013-11-30 MED ORDER — ALBUTEROL SULFATE (2.5 MG/3ML) 0.083% IN NEBU
INHALATION_SOLUTION | RESPIRATORY_TRACT | Status: AC
Start: 2013-11-30 — End: 2013-11-30
  Administered 2013-11-30: 2.5 mg via RESPIRATORY_TRACT
  Filled 2013-11-30: qty 3

## 2013-11-30 MED ORDER — SODIUM CHLORIDE 0.9 % IV SOLN
500.0000 mg | INTRAVENOUS | Status: DC
Start: 2013-11-30 — End: 2013-12-02
  Administered 2013-11-30 – 2013-12-02 (×3): 500 mg via INTRAVENOUS
  Filled 2013-11-30 (×3): qty 500

## 2013-11-30 MED ORDER — ALBUTEROL SULFATE (2.5 MG/3ML) 0.083% IN NEBU
2.5000 mg | INHALATION_SOLUTION | Freq: Four times a day (QID) | RESPIRATORY_TRACT | Status: DC | PRN
Start: 2013-11-30 — End: 2013-12-01
  Administered 2013-12-01: 2.5 mg via RESPIRATORY_TRACT
  Filled 2013-11-30: qty 3

## 2013-11-30 MED ORDER — IBUPROFEN 400 MG PO TABS
ORAL_TABLET | ORAL | Status: AC
Start: 2013-11-30 — End: 2013-11-30
  Administered 2013-11-30: 400 mg
  Filled 2013-11-30: qty 1

## 2013-11-30 NOTE — Plan of Care (Signed)
Ask3Teach3 Program    Education about New Medications and their Side effects    Dear Lindsay Elliott,    Its been a pleasure taking care of you during your hospitalization here at [facility] We have initiated a new program to educate our patients and/or their family members or designated personnel about the new medications started by your physicians and their indications along with the possible side effects. Multiple studies have shown that patients started on new medications are often unaware of the names of the medication along with the indications and their side effects which leads to decreased compliance with the medications.    During our conversation today on 11/30/2013  12:40 PM I have explained to you the name of the new medication and the indication along with some possible common side effects. Listed below are some of the new medications started during this hospitalization.     Please call the Nurse if you have any side effects while in hospital.     Please call 911 if you have any life threatening symptoms after you are discharged from the hospital.    Please inform your Primary care physician for common side effects which are not life threatening after discharge.    Medication:Albuterol(Ventolin/Proventil)   This Medication is used for:   COPD   Asthma   Bronchospasm    Common Side Effects are:   Headache   Tremor   Nervousness   Sleeplessness    A note from your nurse:  Call your nurse immediately if you notice itching, hives, swelling or trouble breathing         Thank you for your time.    Lindsay Elliott, RT  11/30/2013  12:40 PM  [facility]  [address]  [place], [place]  [address]

## 2013-11-30 NOTE — Progress Notes (Signed)
[facility] Hospitalist Daily Progress Note        Date Time: 11/30/2013  5:04 PM  Patient Name:Lindsay Elliott  ZDG:[id number]  PCP: Gregor Hams, MD (General)  Admit Date:11/28/2013  Attending Physician:Kalana Yust, Donnita [place], MD  Length of stay:2  Chief Complaint:      Chief Complaint   Patient presents with   . Emesis     Subjective:    complaining of severe headache, and wheezing and shortness of breath  Denies to have chest pain  Ibuprofen to the edge of headache.  Says she does have neck pain to which was severe, ongoing from this morning  Assessment/Plan     Active Diagnosis: Principal Problem:    Acute pancreatitis  Active Problems:    Elevated LFTs    Leukocytosis    Elevated blood pressure reading without diagnosis of hypertension    Hyperglycemia    1.  Shortness of breath, wheezing.  Differential diagnosis which includes fluid overload has been getting fluids from past 24 hours versus secondary to pneumonia  Chest x-ray done this morning to rule out pneumonia.  Showed to be lingular opacity, suspicious for pneumonia.  She does have elevated white count on admission with cough.  So, started on azithromycin by ID appreciate ID help  --- Also, continue on meropenem empirically  2.  Acute Pancreatitis  No gallstones noted.  Not an alcoholic.  Lipase trended down to normal  [place] nothing by mouth, [place] IV fluids.  Advance diet to cardiac diet.  3.  Elevated transaminases.  Trended down  4.  Hypertension, better controlled controlled after stopping IV fluids  5.  Leukocytosis trended down from 20,000-14,000.  No complete blood count today.  Check complete blood count in a.m.  6.  Asymptomatic bradycardia, resolved  7.  Severe headache.  Ibuprofen.  Given    DVT Prohylaxis: Lovenox  Code Status: Full Code   Disposition: Home  Prognosis: Guarded  Type of Admission:Inpatient  Estimated Length of Stay (including stay in the ER receiving treatment): 2-3 days  Medical Necessity for  stay: Pneumonia, acute pancreatitis    Procedures performed:   No orders of the defined types were placed in this encounter.     Physical Exam:    height is 1.6 m (5\' 3" ) and weight is 80.3 kg (177 lb 0.5 oz). Her temporal artery temperature is 97.5 F (36.4 C). Her blood pressure is 128/60 and her pulse is 96. Her respiration is 20 and oxygen saturation is 95%.   Body mass index is 31.37 kg/(m^2).  Filed Vitals:    11/30/13 0619 11/30/13 0920 11/30/13 1235 11/30/13 1401   BP: 125/60 159/67  128/60   Pulse: 60 96 70 96   Temp: 97 F (36.1 C) 97.9 F (36.6 C)  97.5 F (36.4 C)   TempSrc: Temporal Artery Temporal Artery     Resp: 18 20 16 20    Height:       Weight: 80.3 kg (177 lb 0.5 oz)      SpO2: 93% 94%  95%     Intake and Output Summary (Last 24 hours) at Date Time    Intake/Output Summary (Last 24 hours) at 11/30/13 1704  Last data filed at 11/30/13 1300   Gross per 24 hour   Intake    360 ml   Output   1301 ml   Net   -941 ml     Gen. exam not in acute distress  Constitutional: Patient is oriented to person,  place, and time. Patient appears well-developed and well-nourished.   Head: Normocephalic and atraumatic.  Eyes- pupils equal and reactive, extraocular eye movements intact, sclera anicteric  Ears - external ear canals normal, right ear normal, left ear normal  Nose - normal and patent, no erythema, discharge or polyps and normal nontender sinuses  Mouth - mucous membranes moist, pharynx normal without lesions  Neck: Normal range of motion. Neck supple. No JVD present. No tracheal deviation present. No thyromegaly present.   Cardiovascular: Normal rate, regular rhythm, normal heart sounds and intact distal pulses.  Exam reveals no gallop and no friction rub. No murmur heard.  Pulmonary/Chest: Wheezing both lungs, noted to have crackles bilaterally at the bases  Abdominal: Soft. Bowel sounds are normal. Patient exhibits no distension and no mass was palpable. There is no tenderness. There is no rebound  and no guarding.   Musculoskeletal: Normal range of motion. Patient exhibits no edema and no tenderness.   Lymphadenopathy:  Patient has no cervical adenopathy.   Neurological: Patient is alert and oriented to person, place, and time and has normal reflexes. No cranial nerve deficit.  Normal muscle tone. Coordination normal.   Skin: Skin is warm. No rash noted. Patient is not diaphoretic. No erythema. No pallor.   Psychiatric: Has normal mood and affect. Behavior is normal. Judgment and thought content normal.  Consult Input/Plan   Plan  IP CONSULT TO INFECTIOUS DISEASES  Review of Systems:   A comprehensive review of systems below have been obtained and was negative except for the underlying highlighted in Bold    Constitutional:  malaise/fatigue, diaphoresis.   HENT:   ear pain, nosebleeds, congestion, sore throat,   Eyes:blurred vision, double vision, photophobia, redness.   Respiratory: cough, hemoptysis, sputum production, shortness of breath, wheezing,   Cardiovascular: chest pain, palpitations, orthopnea, claudication, leg swelling and PND.   Gastrointestinal:  heartburn, nausea, vomiting, mild abdominal pain, diarrhea, constipation,   Genitourinary: dysuria, urgency, frequency, hematuria.   Musculoskeletal: myalgias, back pain, joint pain,  Skin: itching, rash.   Neurological: dizziness, tingling, tremors, sensory change, speech change, focal weakness,weakness, headaches.   Endo/Heme/Allergies: polydipsia.polyuria,bleeding.  Psychiatric/Behavioral: depression, suicidal ideas, hallucinations, nervous/anxious, insomnia    Coagulation Profile: No results for input(s): PT, INR, PTT, APTT in the last 168 hours.       Medications:   Current Facility-Administered Medications   Medication Dose Route Frequency Last Rate Last Dose   . albuterol (PROVENTIL) nebulizer solution 2.5 mg  2.5 mg Nebulization Q6H PRN   2.5 mg at 11/30/13 1226   . azithromycin (ZITHROMAX) 500 mg in sodium chloride 0.9 % 250 mL IVPB  500 mg  Intravenous Q24H SCH 250 mL/hr at 11/30/13 1542 500 mg at 11/30/13 1542   . diphenhydrAMINE (BENADRYL) injection 12.5 mg  12.5 mg Intravenous Q4H PRN       . enoxaparin (LOVENOX) syringe 40 mg  40 mg Subcutaneous Daily   40 mg at 11/30/13 1113   . HYDROmorphone (DILAUDID) injection 1 mg  1 mg Intravenous Q2H PRN       . ibuprofen (ADVIL,MOTRIN) 400 MG tablet           . ibuprofen (ADVIL,MOTRIN) tablet 400 mg  400 mg Oral Q6H PRN   400 mg at 11/30/13 1112   . meropenem (MERREM) 1,000 mg in sodium chloride 0.9 % 100 mL IVPB mini-bag plus  1,000 mg Intravenous Q8H 200 mL/hr at 11/30/13 0815 1,000 mg at 11/30/13 0815   . ondansetron (ZOFRAN) injection 4  mg  4 mg Intravenous Q4H PRN       . pantoprazole (PROTONIX) injection 40 mg  40 mg Intravenous Daily   40 mg at 11/30/13 1113   . promethazine (PHENERGAN) injection 12.5 mg  12.5 mg Intravenous Q4H PRN            CBC review:   Recent Labs  Lab 11/29/13  0644 11/28/13  2240   WBC 14.59* 20.93*   HEMOGLOBIN 13.7 15.5   HEMATOCRIT 42.2 46.6   PLATELETS 257 273   MCV 88.5 88.8   RDW 14 14   NEUTROPHILS 74 90   LYMPHOCYTES AUTOMATED  --  4   EOSINOPHILS AUTOMATED  --  0   IMMATURE GRANULOCYTE  --  0   NEUTROPHILS ABSOLUTE  --  18.81*   ABSOLUTE IMMATURE GRANULOCYTE  --  0.08*        Chem Review:  Recent Labs  Lab 11/30/13  0808 11/29/13  0644 11/28/13  2240   SODIUM 141 139 139   POTASSIUM 4.8 4.5 4.3   CHLORIDE 113* 106 104   CO2 22 26 23    BUN 8.4 11.4 13.1   CREATININE 0.8 0.8 0.9   GLUCOSE 83 131* 139*   CALCIUM 7.8* 8.8 10.1   MAGNESIUM  --  2.0 1.6   PHOSPHORUS  --  2.7  --    BILIRUBIN, TOTAL 0.3 0.5 0.5   AST (SGOT) 44* 87* 120*   ALT 49 89* 83*   ALKALINE PHOSPHATASE 63 78 90        Labs:   I have reviewed the labs  Results    Procedure Component Value Units Date/Time    Blood Culture Aerobic/Anaerobic #2 [[id number]] Collected:  11/29/13 0644    Specimen Information:  Blood Updated:  11/30/13 1321    Narrative:      ORDER#: [id number]                                     ORDERED BY: MALACHIAS, ZACH  SOURCE: Blood Peripheral, Left                       COLLECTED:  11/29/13 06:44  ANTIBIOTICS AT COLL.:                                RECEIVED :  11/29/13 12:37  Culture Blood Aerobic and Anaerobic        PRELIM      11/30/13 13:21  11/30/13   No Growth after 1 day/s of incubation.      Lipase [[id number]] Collected:  11/30/13 0808    Specimen Information:  Blood Updated:  11/30/13 0839     Lipase 37 U/L     GFR [[id number]] Collected:  11/30/13 0808     EGFR >60.0 Updated:  11/30/13 0839    Comprehensive metabolic panel [[id number]]  (Abnormal) Collected:  11/30/13 0808    Specimen Information:  Blood Updated:  11/30/13 0839     Glucose 83 mg/dL      BUN 8.4 mg/dL      Creatinine 0.8 mg/dL      Sodium 403 mEq/L      Potassium 4.8 mEq/L      Chloride 113 (H) mEq/L      CO2 22 mEq/L      CALCIUM 7.8 (  L) mg/dL      Protein, Total 6.4 g/dL      Albumin 3.0 (L) g/dL      AST (SGOT) 44 (H) U/L      ALT 49 U/L      Alkaline Phosphatase 63 U/L      Bilirubin, Total 0.3 mg/dL      Globulin 3.4 g/dL      Albumin/Globulin Ratio 0.9      Anion Gap 6.0     Blood Culture Aerobic/Anaerobic #1 [[id number]] Collected:  11/28/13 2343    Specimen Information:  Blood Updated:  11/30/13 0421    Narrative:      ORDER#: [id number]                                    ORDERED BY: MALACHIAS, ZACH  SOURCE: Blood RT AC                                  COLLECTED:  11/28/13 23:43  ANTIBIOTICS AT COLL.:                                RECEIVED :  11/29/13 03:37  Culture Blood Aerobic and Anaerobic        PRELIM      11/30/13 04:21  11/30/13   No Growth after 1 day/s of incubation.          Rads:   I have reviewed the radiological image.  Radiology Results (24 Hour)    Procedure Component Value Units Date/Time    XR Chest 2 Views [[id number]] Collected:  11/30/13 1314    Order Status:  Completed Updated:  11/30/13 1320    Narrative:      HISTORY: Wheezing and shortness of breath      COMPARISON: 11/28/2013 chest  radiograph.    PA and lateral views of the chest. The heart is stable and top normal  size. The mediastinal contour is stable and within normal limits. There  is no pneumothorax or pleural effusion. There is worsening patchy  opacity in the lingula. Mild curvilinear opacities are present in the  lower lobes, favor atelectasis. Moderate degenerative changes are  present in the thoracic spine.      Impression:           1. Worsening patchy opacity in the lingula, suspicious for lingular  pneumonia.    2. Mild curvilinear opacities in both lower lobes, favor atelectasis,  with a component of pneumonia not excluded.    Continued follow-up chest imaging is advised.    Cleone Slim, MD   11/30/2013 1:16 PM            Signed by: Marella Chimes  11/30/2013 5:04 PM

## 2013-11-30 NOTE — Progress Notes (Signed)
Infectious Disease            Progress Note    11/30/2013   Medrith Veillon ZOX:[id number],XBJ:[id number] is a 68 y.o. female, with past medical history significant for gallstone pancreatitis, history of cholecystectomy, history of appendectomy, hysterectomy, admitted with pancreatitis, dental infection.    Subjective:     Lindsay Elliott today Symptoms:  Complains of shortness of breath, abdominal pain. Complains of some cough.  No dysuria, increase frequency. No headache, dizziness or new weakness tingling or numbness. No Rashes or ecchymosis.  Other review of system is non contributory.    Objective:     Blood pressure 159/67, pulse 96, temperature 97.9 F (36.6 C), temperature source Temporal Artery, resp. rate 20, height 1.6 m (5\' 3" ), weight 80.3 kg (177 lb 0.5 oz), SpO2 94 %.    General Appearance:  Looks sick, and short of breath.    HEENT: Pallor negative, Anicteric sclera.  Pupils are equal, round, and reactive to light.   Lungs:  Decreased breath sound at bases, scattered crackles on the left  Heart: S1 and S2.    Chest Wall: Symmetric chest wall expansion.   Abdomen: Abdomen is soft, scaphoid and non-distended. There are no signs of ascites. Bowel sounds are normal.  Mild epigastric discomfort  Neurological: Patient is alert and oriented to person, place and time.  Normal strength. No gross defect.   Extremities: Normal range of motion.  Skin:  Warm and dry.  No rash or ecchymosis.   Psychiatric: Mood and affect is normal    Laboratory And Diagnostic Studies:     Recent Labs      11/29/13   0644  11/28/13   2240   WBC  14.59*  20.93*   HEMOGLOBIN  13.7  15.5   HEMATOCRIT  42.2  46.6   PLATELETS  257  273   NEUTROPHILS  74  90     Recent Labs      11/30/13   0808  11/29/13   0644   SODIUM  141  139   POTASSIUM  4.8  4.5   CHLORIDE  113*  106   CO2  22  26   BUN  8.4  11.4   CREATININE  0.8  0.8   GLUCOSE  83  131*   CALCIUM  7.8*  8.8     Recent Labs      11/30/13   0808  11/29/13   0644   AST (SGOT)  44*   87*   ALT  49  89*   ALKALINE PHOSPHATASE  63  78   PROTEIN, TOTAL  6.4  6.7   ALBUMIN  3.0*  3.5   BILIRUBIN, TOTAL  0.3  0.5     X-ray chest: 1. Worsening patchy opacity in the lingula, suspicious for lingular  pneumonia.  2. Mild curvilinear opacities in both lower lobes, favor atelectasis,  with a component of pneumonia not excluded.    Current Med's:     Current Facility-Administered Medications   Medication Dose Route Frequency   . enoxaparin  40 mg Subcutaneous Daily   . meropenem  1,000 mg Intravenous Q8H   . pantoprazole  40 mg Intravenous Daily         Assessment:      Condition.  Guarded   Pancreatitis; resolving   Pneumonia   Dental infection    Plan:      Continue meropenem   Start Zithromax   Will follow Cultures  Repeat CBC, CMP, tomorrow   Continue supportive care   Discussed with Dr. Elizebeth Koller, M.D.,FACP  11/30/2013  11:04 AM

## 2013-11-30 NOTE — Plan of Care (Signed)
Problem: Pain  Goal: Patient's pain/discomfort is manageable  Outcome: Progressing  Patient c/o headache relieved with prn ibuprofen. Pt denies abdominal pain at this time and discussed that she will be advancing diet today.   Patient denies nausea, vomiting or diarrhea at this time. She had a normal bowel movement today.

## 2013-12-01 ENCOUNTER — Inpatient Hospital Stay: Payer: Medicare Other

## 2013-12-01 LAB — CBC AND DIFFERENTIAL
Basophils Absolute Automated: 0.01 10*3/uL (ref 0.00–0.20)
Basophils Automated: 0 %
Eosinophils Absolute Automated: 0.8 10*3/uL — ABNORMAL HIGH (ref 0.00–0.70)
Eosinophils Automated: 10 %
Hematocrit: 37.5 % (ref 37.0–47.0)
Hgb: 12.2 g/dL (ref 12.0–16.0)
Immature Granulocytes Absolute: 0.03 10*3/uL
Immature Granulocytes: 0 %
Lymphocytes Absolute Automated: 2.81 10*3/uL (ref 0.50–4.40)
Lymphocytes Automated: 34 %
MCH: 29.1 pg (ref 28.0–32.0)
MCHC: 32.5 g/dL (ref 32.0–36.0)
MCV: 89.5 fL (ref 80.0–100.0)
MPV: 10.2 fL (ref 9.4–12.3)
Monocytes Absolute Automated: 0.76 10*3/uL (ref 0.00–1.20)
Monocytes: 9 %
Neutrophils Absolute: 3.97 10*3/uL (ref 1.80–8.10)
Neutrophils: 48 %
Platelets: 211 10*3/uL (ref 140–400)
RBC: 4.19 10*6/uL — ABNORMAL LOW (ref 4.20–5.40)
RDW: 14 % (ref 12–15)
WBC: 8.35 10*3/uL (ref 3.50–10.80)

## 2013-12-01 LAB — COMPREHENSIVE METABOLIC PANEL
ALT: 33 U/L (ref 0–55)
AST (SGOT): 27 U/L (ref 5–34)
Albumin/Globulin Ratio: 1.1 (ref 0.9–2.2)
Albumin: 3 g/dL — ABNORMAL LOW (ref 3.5–5.0)
Alkaline Phosphatase: 62 U/L (ref 37–106)
Anion Gap: 5 (ref 5.0–15.0)
BUN: 8 mg/dL (ref 7.0–19.0)
Bilirubin, Total: 0.3 mg/dL (ref 0.2–1.2)
CO2: 27 mEq/L (ref 22–29)
Calcium: 8.1 mg/dL — ABNORMAL LOW (ref 8.5–10.5)
Chloride: 110 mEq/L (ref 100–111)
Creatinine: 0.7 mg/dL (ref 0.6–1.0)
Globulin: 2.8 g/dL (ref 2.0–3.6)
Glucose: 93 mg/dL (ref 70–100)
Potassium: 3.6 mEq/L (ref 3.5–5.1)
Protein, Total: 5.8 g/dL — ABNORMAL LOW (ref 6.0–8.3)
Sodium: 142 mEq/L (ref 136–145)

## 2013-12-01 LAB — LIPASE: Lipase: 18 U/L (ref 8–78)

## 2013-12-01 LAB — GFR: EGFR: >60

## 2013-12-01 MED ORDER — METHYLPREDNISOLONE SODIUM SUCC 40 MG IJ SOLR
40.0000 mg | Freq: Three times a day (TID) | INTRAMUSCULAR | Status: DC
Start: 2013-12-01 — End: 2013-12-02
  Administered 2013-12-01 – 2013-12-02 (×4): 40 mg via INTRAVENOUS
  Filled 2013-12-01: qty 1
  Filled 2013-12-01: qty 2
  Filled 2013-12-01: qty 1

## 2013-12-01 MED ORDER — ALBUTEROL SULFATE (2.5 MG/3ML) 0.083% IN NEBU
2.5000 mg | INHALATION_SOLUTION | RESPIRATORY_TRACT | Status: DC
Start: 2013-12-01 — End: 2013-12-02
  Administered 2013-12-01 – 2013-12-02 (×6): 2.5 mg via RESPIRATORY_TRACT
  Filled 2013-12-01 (×7): qty 3

## 2013-12-01 NOTE — Progress Notes (Signed)
[facility] Hospitalist Daily Progress Note        Date Time: 12/01/2013  4:36 PM  Patient Name:Lindsay Elliott  ZYS:[id number]  PCP: Gregor Hams, MD (General)  Admit Date:11/28/2013  Attending Physician:Tameaka Eichhorn, [place]nita [place], MD  Length of stay:3  Chief Complaint:      Chief Complaint   Patient presents with   . Emesis     Subjective:   Feels wheezing and continues to have shortness of breath  Denies chest pain, abdominal pain and was able to tolerate food  Assessment/Plan     Active Diagnosis: Principal Problem:    Acute pancreatitis  Active Problems:    Elevated LFTs    Leukocytosis    Elevated blood pressure reading without diagnosis of hypertension    Hyperglycemia    1.Shortness of breath, wheezing.  Possible asthma exacerbation/chronic obstructive pulmonary disease exacerbation with pneumonia  Secondary to pneumonia.  Patient does not have diagnosis of asthma or chronic obstructive pulmonary disease, does not have history of tobacco abuse.  She did have previous episodes of wheezing in the past and unclear diagnosis.  So far.  ---.  Continue to treat with azithromycin  IV for pneumonia.  Start albuterol nebulizers every 4 hourly scheduled, also started on steroids IV as she does have persistent wheezing  --- Also, continue on meropenem empirically  --Monitor complete blood count, BMP in a.m.  Repeat chest x-ray today, I requested her to get pulmonary function tests once stable medically after 2 weeks to further diagnose obstructive versus restrictive lung disease  2. Acute Pancreatitis, resolved  No gallstones noted. Not an alcoholic.  Lipase trended down to normal  Able to tolerate diet, resolved abdominal pain, no abdominal tenderness  3. Elevated transaminases.  Trended down  4. Hypertension, better controlled controlled after stopping IV fluids  5. Leukocytosis , resolved.   6. Asymptomatic bradycardia, resolved  7. Severe headache. Ibuprofen. Given    DVT  Prohylaxis: Lovenox  Code Status: Full Code   Disposition: Home when medically stable  Prognosis: Good  Type of Admission:Inpatient  Estimated Length of Stay (including stay in the ER receiving treatment): 2-3 days  Medical Necessity for stay: Pneumonia, pancreatitis    Procedures performed:   No orders of the defined types were placed in this encounter.     Physical Exam:    height is 1.6 m (5\' 3" ) and weight is 80.7 kg (177 lb 14.6 oz). Her temporal artery temperature is 97.3 F (36.3 C). Her blood pressure is 134/60 and her pulse is 62. Her respiration is 16 and oxygen saturation is 95%.   Body mass index is 31.52 kg/(m^2).  Filed Vitals:    12/01/13 0645 12/01/13 0911 12/01/13 0928 12/01/13 1359   BP: 122/58  134/63 134/60   Pulse: 54 85 91 62   Temp: 96.3 F (35.7 C)  96.4 F (35.8 C) 97.3 F (36.3 C)   TempSrc: Temporal Artery   Temporal Artery   Resp: 18 18 18 16    Height:       Weight: 80.7 kg (177 lb 14.6 oz)      SpO2: 93%  97% 95%     Intake and Output Summary (Last 24 hours) at Date Time    Intake/Output Summary (Last 24 hours) at 12/01/13 1636  Last data filed at 12/01/13 1100   Gross per 24 hour   Intake    360 ml   Output      0 ml   Net  360 ml     Gen. exam seems lethargic, tired, not in acute distress  Constitutional: Patient is oriented to person, place, and time. Patient appears well-developed and well-nourished.   Head: Normocephalic and atraumatic.  Eyes- pupils equal and reactive, extraocular eye movements intact, sclera anicteric  Ears - external ear canals normal, right ear normal, left ear normal  Nose - normal and patent, no erythema, discharge or polyps and normal nontender sinuses  Mouth - mucous membranes moist, pharynx normal without lesions  Neck: Normal range of motion. Neck supple. No JVD present. No tracheal deviation present. No thyromegaly present.   Cardiovascular: Normal rate, regular rhythm, normal heart sounds and intact distal pulses.  Exam reveals no gallop and no  friction rub. No murmur heard.  Pulmonary/Chest: Persistent wheezing both lung fields  Abdominal: Soft. Bowel sounds are normal. Patient exhibits no distension and no mass was palpable. There is no tenderness. There is no rebound and no guarding.   Musculoskeletal: Normal range of motion. Patient exhibits no edema and no tenderness.   Lymphadenopathy:  Patient has no cervical adenopathy.   Neurological: Patient is alert and oriented to person, place, and time and has normal reflexes. No cranial nerve deficit.  Normal muscle tone. Coordination normal.   Skin: Skin is warm. No rash noted. Patient is not diaphoretic. No erythema. No pallor.   Psychiatric: Has normal mood and affect. Behavior is normal. Judgment and thought content normal.  Consult Input/Plan   Plan  IP CONSULT TO INFECTIOUS DISEASES  Review of Systems:   A comprehensive review of systems below have been obtained and was negative except for the underlying highlighted in Bold    Constitutional:  malaise/fatigue, diaphoresis.   HENT:   ear pain, nosebleeds, congestion, sore throat,   Eyes:blurred vision, double vision, photophobia, redness.   Respiratory: cough, hemoptysis, sputum production, shortness of breath, wheezing,   Cardiovascular: chest pain, palpitations, orthopnea, claudication, leg swelling and PND.   Gastrointestinal:  heartburn, nausea, vomiting, abdominal pain, diarrhea, constipation,   Genitourinary: dysuria, urgency, frequency, hematuria.   Musculoskeletal: myalgias, back pain, joint pain,  Skin: itching, rash.   Neurological: dizziness, tingling, tremors, sensory change, speech change, focal weakness,weakness, headaches.   Endo/Heme/Allergies: polydipsia.polyuria,bleeding.  Psychiatric/Behavioral: depression, suicidal ideas, hallucinations, nervous/anxious, insomnia    Coagulation Profile: No results for input(s): PT, INR, PTT, APTT in the last 168 hours.       Medications:   Current Facility-Administered Medications   Medication Dose  Route Frequency Last Rate Last Dose   . albuterol (PROVENTIL) nebulizer solution 2.5 mg  2.5 mg Nebulization Q4H SCH   2.5 mg at 12/01/13 1537   . azithromycin (ZITHROMAX) 500 mg in sodium chloride 0.9 % 250 mL IVPB  500 mg Intravenous Q24H SCH 250 mL/hr at 12/01/13 1021 500 mg at 12/01/13 1021   . diphenhydrAMINE (BENADRYL) injection 12.5 mg  12.5 mg Intravenous Q4H PRN       . enoxaparin (LOVENOX) syringe 40 mg  40 mg Subcutaneous Daily   40 mg at 12/01/13 1020   . HYDROmorphone (DILAUDID) injection 1 mg  1 mg Intravenous Q2H PRN       . ibuprofen (ADVIL,MOTRIN) tablet 400 mg  400 mg Oral Q6H PRN   400 mg at 11/30/13 1112   . meropenem (MERREM) 1,000 mg in sodium chloride 0.9 % 100 mL IVPB mini-bag plus  1,000 mg Intravenous Q8H 200 mL/hr at 12/01/13 1611 1,000 mg at 12/01/13 1611   . methylprednisolone sodium succinate (Solu-MEDROL) injection  40 mg  40 mg Intravenous Q8H SCH   40 mg at 12/01/13 1406   . ondansetron (ZOFRAN) injection 4 mg  4 mg Intravenous Q4H PRN       . pantoprazole (PROTONIX) injection 40 mg  40 mg Intravenous Daily   40 mg at 12/01/13 1020   . promethazine (PHENERGAN) injection 12.5 mg  12.5 mg Intravenous Q4H PRN            CBC review:   Recent Labs  Lab 12/01/13  0607 11/29/13  0644 11/28/13  2240   WBC 8.35 14.59* 20.93*   HEMOGLOBIN 12.2 13.7 15.5   HEMATOCRIT 37.5 42.2 46.6   PLATELETS 211 257 273   MCV 89.5 88.5 88.8   RDW 14 14 14    NEUTROPHILS 48 74 90   LYMPHOCYTES AUTOMATED 34  --  4   EOSINOPHILS AUTOMATED 10  --  0   IMMATURE GRANULOCYTE 0  --  0   NEUTROPHILS ABSOLUTE 3.97  --  18.81*   ABSOLUTE IMMATURE GRANULOCYTE 0.03  --  0.08*        Chem Review:  Recent Labs  Lab 12/01/13  0607 11/30/13  0808 11/29/13  0644 11/28/13  2240   SODIUM 142 141 139 139   POTASSIUM 3.6 4.8 4.5 4.3   CHLORIDE 110 113* 106 104   CO2 27 22 26 23    BUN 8.0 8.4 11.4 13.1   CREATININE 0.7 0.8 0.8 0.9   GLUCOSE 93 83 131* 139*   CALCIUM 8.1* 7.8* 8.8 10.1   MAGNESIUM  --   --  2.0 1.6   PHOSPHORUS  --    --  2.7  --    BILIRUBIN, TOTAL 0.3 0.3 0.5 0.5   AST (SGOT) 27 44* 87* 120*   ALT 33 49 89* 83*   ALKALINE PHOSPHATASE 62 63 78 90        Labs:   I have reviewed the labs  Results    Procedure Component Value Units Date/Time    Blood Culture Aerobic/Anaerobic #2 [[id number]] Collected:  11/29/13 0644    Specimen Information:  Blood Updated:  12/01/13 1321    Narrative:      ORDER#: [id number]                                    ORDERED BY: MALACHIAS, ZACH  SOURCE: Blood Peripheral, Left                       COLLECTED:  11/29/13 06:44  ANTIBIOTICS AT COLL.:                                RECEIVED :  11/29/13 12:37  Culture Blood Aerobic and Anaerobic        PRELIM      12/01/13 13:21  11/30/13   No Growth after 1 day/s of incubation.  12/01/13   No Growth after 2 day/s of incubation.      Comprehensive metabolic panel [[id number]]  (Abnormal) Collected:  12/01/13 0607    Specimen Information:  Blood Updated:  12/01/13 0651     Glucose 93 mg/dL      BUN 8.0 mg/dL      Creatinine 0.7 mg/dL      Sodium 086 mEq/L      Potassium 3.6 mEq/L  Chloride 110 mEq/L      CO2 27 mEq/L      CALCIUM 8.1 (L) mg/dL      Protein, Total 5.8 (L) g/dL      Albumin 3.0 (L) g/dL      AST (SGOT) 27 U/L      ALT 33 U/L      Alkaline Phosphatase 62 U/L      Bilirubin, Total 0.3 mg/dL      Globulin 2.8 g/dL      Albumin/Globulin Ratio 1.1      Anion Gap 5.0     Lipase [[id number]] Collected:  12/01/13 0607    Specimen Information:  Blood Updated:  12/01/13 0651     Lipase 18 U/L     GFR [[id number]] Collected:  12/01/13 0607     EGFR >60.0 Updated:  12/01/13 0651    CBC and differential [[id number]]  (Abnormal) Collected:  12/01/13 0607     WBC 8.35 x10 3/uL Updated:  12/01/13 0631     RBC 4.19 (L) x10 6/uL      Hgb 12.2 g/dL      Hematocrit 78.4 %      MCV 89.5 fL      MCH 29.1 pg      MCHC 32.5 g/dL      RDW 14 %      Platelets 211 x10 3/uL      MPV 10.2 fL      Neutrophils 48 %      Lymphocytes Automated 34 %      Monocytes 9 %       Eosinophils Automated 10 %      Basophils Automated 0 %      Immature Granulocyte 0 %      Neutrophils Absolute 3.97 x10 3/uL      Abs Lymph Automated 2.81 x10 3/uL      Abs Mono Automated 0.76 x10 3/uL      Abs Eos Automated 0.80 (H) x10 3/uL      Absolute Baso Automated 0.01 x10 3/uL      Absolute Immature Granulocyte 0.03 x10 3/uL     Blood Culture Aerobic/Anaerobic #1 [[id number]] Collected:  11/28/13 2343    Specimen Information:  Blood Updated:  12/01/13 0421    Narrative:      ORDER#: [id number]                                    ORDERED BY: MALACHIAS, ZACH  SOURCE: Blood RT AC                                  COLLECTED:  11/28/13 23:43  ANTIBIOTICS AT COLL.:                                RECEIVED :  11/29/13 03:37  Culture Blood Aerobic and Anaerobic        PRELIM      12/01/13 04:21  11/30/13   No Growth after 1 day/s of incubation.  12/01/13   No Growth after 2 day/s of incubation.          Rads:   I have reviewed the radiological image.  Radiology Results (24 Hour)    Procedure Component Value Units Date/Time    XR Chest 2  Views [[id number]] Collected:  12/01/13 1353    Order Status:  Completed Updated:  12/01/13 1400    Narrative:      HISTORY: Pneumonia.      COMPARISON: Chest radiograph from one day prior.    PA and lateral views of the chest. The heart is stable and top normal  size. The mediastinal contour is stable. There is no pneumothorax. Trace  bilateral pleural effusions are stable. There are new minimal patchy  opacities in the peripheral right mid lung. There is stable patchy  consolidation in the lingula. Mild curvilinear opacities at the  bilateral lower lobe bases likely represent atelectasis. Moderate  degenerative changes are present in the thoracic spine. Cholecystectomy  clips are seen in the right upper abdomen.      Impression:           1. Stable patchy lingular consolidation. New mild patchy peripheral  right mid lung opacities. Findings suggest multifocal pneumonia.    2. Mild  curvilinear opacities in the basilar lower lobes, favor  subsegmental atelectasis.    3. Stable trace bilateral pleural effusions.    Cleone Slim, MD   12/01/2013 1:56 PM            Signed by: Marella Chimes  12/01/2013 4:36 PM

## 2013-12-01 NOTE — Plan of Care (Signed)
Problem: Safety  Goal: Patient will be free from injury during hospitalization  Outcome: Progressing  Encourage patient to use call bell for assistance. Hourly rounding.     Problem: Pain  Goal: Patient's pain/discomfort is manageable  Outcome: Progressing  Monitor pain with hourly rounding. Encourage patient to call for pain.

## 2013-12-01 NOTE — Plan of Care (Signed)
Problem: Safety  Goal: Patient will be free from injury during hospitalization  Outcome: Progressing  Bed in low position. Bed alarm on. Call bell within reach.   Intervention: Provide and maintain safe environment  .      Problem: Pain  Goal: Patient's pain/discomfort is manageable  Outcome: Progressing  Pt denies pain. Will continue to monitor.    Problem: Psychosocial and Spiritual Needs  Goal: Demonstrates ability to cope with hospitalization/illness  Outcome: Progressing

## 2013-12-01 NOTE — Progress Notes (Signed)
Infectious Disease            Progress Note    12/01/2013   Lindsay Elliott ZOX:[id number],XBJ:[id number] is a 68 y.o. female, with past medical history significant for gallstone pancreatitis, history of cholecystectomy, history of appendectomy, hysterectomy, admitted with pancreatitis, dental infection.    Subjective:     Lindsay Elliott today Symptoms: Still wheezing, complains of shortness of breath, abdominal pain. Complains of cough.  No dysuria, increase frequency. No headache, dizziness or new weakness tingling or numbness. No Rashes or ecchymosis.  Other review of system is non contributory.    Objective:     Blood pressure 134/63, pulse 91, temperature 96.4 F (35.8 C), temperature source Temporal Artery, resp. rate 18, height 1.6 m (5\' 3" ), weight 80.7 kg (177 lb 14.6 oz), SpO2 97 %.    General Appearance:   Looks little short of breath  HEENT: Pallor negative, Anicteric sclera.  Pupils are equal, round, and reactive to light.   Lungs:  Decreased breath sound at bases,  Bilateral wheezing  Heart: S1 and S2.    Chest Wall: Symmetric chest wall expansion.   Abdomen: Abdomen is soft, scaphoid and non-distended. There are no signs of ascites. Bowel sounds are normal.  Mild epigastric discomfort  Neurological: Patient is alert and oriented to person, place and time.  Normal strength. No gross defect.   Extremities: Normal range of motion.  Skin:  Warm and dry.  No rash or ecchymosis.   Psychiatric: Mood and affect is normal    Laboratory And Diagnostic Studies:     Recent Labs      12/01/13   0607  11/29/13   0644   WBC  8.35  14.59*   HEMOGLOBIN  12.2  13.7   HEMATOCRIT  37.5  42.2   PLATELETS  211  257   NEUTROPHILS  48  74     Recent Labs      12/01/13   0607  11/30/13   0808   SODIUM  142  141   POTASSIUM  3.6  4.8   CHLORIDE  110  113*   CO2  27  22   BUN  8.0  8.4   CREATININE  0.7  0.8   GLUCOSE  93  83   CALCIUM  8.1*  7.8*     Recent Labs      12/01/13   0607  11/30/13   0808   AST (SGOT)  27  44*    ALT  33  49   ALKALINE PHOSPHATASE  62  63   PROTEIN, TOTAL  5.8*  6.4   ALBUMIN  3.0*  3.0*   BILIRUBIN, TOTAL  0.3  0.3     X-ray chest: 1. Worsening patchy opacity in the lingula, suspicious for lingular  pneumonia.  2. Mild curvilinear opacities in both lower lobes, favor atelectasis,  with a component of pneumonia not excluded.    Current Med's:     Current Facility-Administered Medications   Medication Dose Route Frequency   . azithromycin  500 mg Intravenous Q24H SCH   . enoxaparin  40 mg Subcutaneous Daily   . meropenem  1,000 mg Intravenous Q8H   . pantoprazole  40 mg Intravenous Daily         Assessment:      Condition.  Guarded   Pancreatitis; resolving   Pneumonia   Dental infection    Plan:      Continue meropenem   Continue Zithromax  Repeat chest x-ray   Continue supportive care   Discussed with Dr. Elizebeth Koller, M.D.,FACP  12/01/2013  11:14 AM

## 2013-12-01 NOTE — Progress Notes (Addendum)
MD/RN Care Coordination Rounds    Date Time: 12/01/2013 1:55 PM  Patient Name: Lindsay Elliott, Lindsay Elliott  Room No: Z610/R604-V  Admit Date: 11/28/2013  Length of Stay: 3  Attending Physician: Marella Chimes, MD   Code Status:Full Code  Admit Status: Inpatient    Admitting RN will complete this section on day of admission, followed by RN for each shift with updates with the date before each comment in appropriate section. Please do not delete the prior comments made by other RN's. RN will round with attending physician outside the room to address concerns and physician will round in the patients room with RN to explain the treatment plan to the patient.     RN Report Items Please Date Each Entry   1. Update Status - Any overnight events?  12/01/13 wheezing&SOB   2. Patient/family Goals/questions or concerns?  12/01/13 d/c plan        3. Abnormal Vitals?  12/01/13 VSS, 7/6 stable        4. Patient in pain? Is it well controlled?  12/01/13 No pain , 7/6 no pain     5. Fluid and Food intake concerns?  12/01/13 No concerns   6. Urine and Bowel habits concerns?  12/01/13 None       7. Issues Mental Status and ADLs?  12/01/13 N/A   8. Foley Catheter: If Yes, Order Present? 12/01/13 N/A   9. If Foley Present,  Removal Criteria Reviewed?          10. Central Line? 12/01/13 N/A   11. VTE prophylaxis?          12. Any abnormal labs? K, Mg, hb, Cr, LFT,?  12/01/13 N/A   13. RN concerns or questions?  12/01/13 Meds/discharge plan   14. Ask 3 Teach 3 done on new meds?    15. If no events past 24 hrs, please request Physician to discontinue telemetry monitor    16. Palliative Screen Positive?               HOSPITALIST will address any issues or concerns above with the RN before entering patients room and will update the on the following items.      Hospitalist Update Items Date/Initials-   1. Addressed Family/Pt/RN concerns or questions [facility] 12/01/13   2. Informed Chief Complaint and Diagnosis  [facility] 12/01/13   3. Informed Tests ordered/results and pending tests [facility]  12/01/13    4. Informed New/Changed meds with Side Effects [facility] 12/01/13   5. Informed Pain Control Plan [facility] 12/01/13   6. Informed [place] Dispo/Criteria and Approx. days required for [place] [facility] 12/01/13     Attending Physician: Marella Chimes, MD   Date Time: 12/01/2013 1:55 PM    Vitals 24 hrs:   Filed Vitals:    12/01/13 0158 12/01/13 0645 12/01/13 0911 12/01/13 0928   BP: 121/58 122/58  134/63   Pulse: 53 54 85 91   Temp: 97 F (36.1 C) 96.3 F (35.7 C)  96.4 F (35.8 C)   TempSrc: Temporal Artery Temporal Artery     Resp: 18 18 18 18    Height:       Weight:  80.7 kg (177 lb 14.6 oz)     SpO2: 92% 93%  97%        Readmission:   Admission on 03/30/2013, Discharged on 04/02/2013   Component Date Value Ref Range Status   . WBC 03/30/2013 11.60* 3.50 - 10.80 Final   . RBC 03/30/2013 4.79  4.20 -  5.40 Final   . Hgb 03/30/2013 14.3  12.0 - 16.0 g/dL Final   . Hematocrit 16/02/9603 42.4  37.0 - 47.0 % Final   . MCV 03/30/2013 88.5  80.0 - 100.0 fL Final   . MCH 03/30/2013 29.9  28.0 - 32.0 pg Final   . MCHC 03/30/2013 33.7  32.0 - 36.0 g/dL Final   . RDW 54/01/8118 14  12 - 15 % Final   . Platelets 03/30/2013 291  140 - 400 Final   . MPV 03/30/2013 10.5  9.4 - 12.3 fL Final   . Neutrophils 03/30/2013 72  None % Final   . Lymphocytes Automated 03/30/2013 21  None % Final   . Monocytes 03/30/2013 6  None % Final   . Eosinophils Automated 03/30/2013 1  None % Final   . Basophils Automated 03/30/2013 0  None % Final   . Immature Granulocyte 03/30/2013 0  None % Final   . Neutrophils Absolute 03/30/2013 8.41* 1.80 - 8.10 Final   . Abs Lymph Automated 03/30/2013 2.43  0.50 - 4.40 Final   . Abs Mono Automated 03/30/2013 0.65  0.00 - 1.20 Final   . Abs Eos Automated 03/30/2013 0.10  0.00 - 0.70 Final   . Absolute Baso Automated 03/30/2013 0.01  0.00 - 0.20 Final   . Absolute Immature Granulocyte 03/30/2013 0.02  0 Final   . Glucose 03/30/2013 115* 70 - 100 mg/dL Final    Comment: Interpretive Data for Adult Female and Female Population                            Indeterminate Range:  100-125 mg/dL                           Equal to or greater than 126 mg/dL meets the ADA                           guidelines for Diabetes Mellitus diagnosis if symptoms                           are present and confirmed by repeat testing.                           Random (Non-Fasting)Interpretive Data (Adults):                           Equal to or greater than 200 mg/dL meets the ADA                           guidelines for Diabetes Mellitus diagnosis if symptoms                           are present and confirmed by Fasting Glucose or GTT.   . BUN 03/30/2013 9.3  7.0 - 19.0 mg/dL Final   . Creatinine 14/78/2956 0.8  0.6 - 1.0 mg/dL Final   . Sodium 21/30/8657 140  136 - 145 Final   . Potassium 03/30/2013 4.3  3.5 - 5.1 Final    Specimen slightly hemolyzed   . Chloride 03/30/2013 107  98 - 107 Final   . CO2 03/30/2013 20* 22 - 29 Final   .  CALCIUM 03/30/2013 9.5  8.5 - 10.5 mg/dL Final   . Protein, Total 03/30/2013 7.7  6.0 - 8.3 g/dL Final   . Albumin 16/02/9603 3.9  3.5 - 5.0 g/dL Final   . AST (SGOT) 54/01/8118 197* 5 - 34 U/L Final   . ALT 03/30/2013 256* 0 - 55 U/L Final   . Alkaline Phosphatase 03/30/2013 97  40 - 150 U/L Final   . Bilirubin, Total 03/30/2013 0.6  0.2 - 1.2 mg/dL Final   . Globulin 14/78/2956 3.8* 2.0 - 3.6 g/dL Final   . Albumin/Globulin Ratio 03/30/2013 1.0  0.9 - 2.2 Final   . Anion Gap 03/30/2013 13.0  5.0 - 15.0 Final   . Lipase 03/30/2013 844* 8 - 78 U/L Final   . Urine Type 03/30/2013 Clean Catch   Final   . Color, UA 03/30/2013 YELLOW  Clear - Yellow Final   . Clarity, UA 03/30/2013 SLIGHTLY CLOUDY  Clear - Hazy Final   . Specific Gravity UA 03/30/2013 1.010  1.001-1.035 Final   . Urine pH 03/30/2013 6.0  5.0-8.0 Final   . Leukocyte Esterase, UA 03/30/2013 TRACE* Negative Final   . Nitrite, UA 03/30/2013 NEGATIVE  Negative Final   . Protein, UR 03/30/2013 NEGATIVE  Negative Final   . Glucose, UA 03/30/2013 NEGATIVE  Negative Final   . Ketones  UA 03/30/2013 NEGATIVE  Negative Final   . Urobilinogen, UA 03/30/2013 0.2  0.2-2.0 mg/dL Final   . Bilirubin, UA 03/30/2013 NEGATIVE  Negative Final   . Blood, UA 03/30/2013 NEGATIVE  Negative Final   . EGFR 03/30/2013 >60.0   Final    Comment: Disease State Reference Ranges:                             Chronic Kidney Disease; < 60 ml/min/1.73 sq.m                             Kidney Failure; < 15 ml/min/1.73 sq.m                             [Calculated using IDMS-Traceable MDRD equation (based on                             gender, age and black vs. non-black race) recommended by                             [employer] Kidney Disease Education Program. No data                             available for non-white, non-black race.]                           GFR estimates are unreliable in patients with:                             Rapidly changing kidney function or recent dialysis,                             extreme age, body size or body composition(obesity,  severe malnutrition). Abnormal muscle mass (limb                             amputation, muscle wasting). In these patients,                             alternative determinations of GFR should be obtained.   . Lactic acid 03/30/2013 1.4  0.7 - 2.1 mmol/L Final   . RBC, UA 03/30/2013 None Seen  0 - 5 Final   . WBC, UA 03/30/2013 11 - 25* 0 - 5 Final   . Squamous Epithelial Cells, Urine 03/30/2013 0 - 5  0 - 25 Final   . Urine Bacteria 03/30/2013 Few* Rare Final   . Magnesium 03/31/2013 1.7  1.6 - 2.6 mg/dL Final   . Glucose 91/47/8295 103* 70 - 100 mg/dL Final    Comment: Interpretive Data for Adult Female and Female Population                           Indeterminate Range:  100-125 mg/dL                           Equal to or greater than 126 mg/dL meets the ADA                           guidelines for Diabetes Mellitus diagnosis if symptoms                           are present and confirmed by repeat testing.                            Random (Non-Fasting)Interpretive Data (Adults):                           Equal to or greater than 200 mg/dL meets the ADA                           guidelines for Diabetes Mellitus diagnosis if symptoms                           are present and confirmed by Fasting Glucose or GTT.   . BUN 03/31/2013 6.9* 7.0 - 19.0 mg/dL Final   . Creatinine 62/13/0865 0.8  0.6 - 1.0 mg/dL Final   . Sodium 78/46/9629 141  136 - 145 Final   . Potassium 03/31/2013 4.1  3.5 - 5.1 Final   . Chloride 03/31/2013 109* 98 - 107 Final   . CO2 03/31/2013 24  22 - 29 Final   . CALCIUM 03/31/2013 8.6  8.5 - 10.5 mg/dL Final   . Protein, Total 03/31/2013 6.4  6.0 - 8.3 g/dL Final   . Albumin 52/84/1324 3.2* 3.5 - 5.0 g/dL Final   . AST (SGOT) 40/02/2724 130* 5 - 34 U/L Final   . ALT 03/31/2013 198* 0 - 55 U/L Final   . Alkaline Phosphatase 03/31/2013 86  40 - 150 U/L Final   . Bilirubin, Total 03/31/2013 0.5  0.2 - 1.2 mg/dL Final   . Globulin 36/64/4034 3.2  2.0 - 3.6 g/dL Final   . Albumin/Globulin Ratio 03/31/2013 1.0  0.9 - 2.2 Final   . Anion Gap 03/31/2013 8.0  5.0 - 15.0 Final   . WBC 03/31/2013 9.50  3.50 - 10.80 Final   . RBC 03/31/2013 4.41  4.20 - 5.40 Final   . Hgb 03/31/2013 12.9  12.0 - 16.0 g/dL Final   . Hematocrit 65/78/4696 39.5  37.0 - 47.0 % Final   . MCV 03/31/2013 89.6  80.0 - 100.0 fL Final   . MCH 03/31/2013 29.3  28.0 - 32.0 pg Final   . MCHC 03/31/2013 32.7  32.0 - 36.0 g/dL Final   . RDW 29/52/8413 14  12 - 15 % Final   . Platelets 03/31/2013 259  140 - 400 Final   . MPV 03/31/2013 10.6  9.4 - 12.3 fL Final   . Neutrophils 03/31/2013 64  None % Final   . Lymphocytes Automated 03/31/2013 27  None % Final   . Monocytes 03/31/2013 6  None % Final   . Eosinophils Automated 03/31/2013 2  None % Final   . Basophils Automated 03/31/2013 0  None % Final   . Immature Granulocyte 03/31/2013 0  None % Final   . Neutrophils Absolute 03/31/2013 6.09  1.80 - 8.10 Final   . Abs Lymph Automated 03/31/2013 2.55  0.50 - 4.40 Final   .  Abs Mono Automated 03/31/2013 0.61  0.00 - 1.20 Final   . Abs Eos Automated 03/31/2013 0.23  0.00 - 0.70 Final   . Absolute Baso Automated 03/31/2013 0.02  0.00 - 0.20 Final   . Absolute Immature Granulocyte 03/31/2013 0.02  0 Final   . Cholesterol 03/31/2013 159  0 - 199 mg/dL Final   . Triglycerides 03/31/2013 94  34 - 149 mg/dL Final   . HDL 24/40/1027 38* >40 mg/dL Final    Comment: HDL:                                Less than 40 mg/dL - Major risk heart disease                                Greater than or equal to 60 mg/dL - Negative risk factor                                for heart disease   . LDL Calculated 03/31/2013 253* 0 - 99 mg/dL Final   . VLDL Cholesterol Cal 03/31/2013 19  10 - 40 mg/dL Final   . CHOL/HDL Ratio 03/31/2013 4.2  See Below Final    Comment: Chol/HDL Ratio:                           Classification                   Female     Female                           Very Low (1/2 Average Risk)      <3.4     <3.3  Low Risk                         4.0      3.8                           Average Risk                     5.0      4.5                           Moderate Risk (2X Average risk)  9.5      7.0                           High Risk (3X Average Risk)      >23.0    >11.0   . Hemolysis Index 03/31/2013 7  0 - 9 Final   . EGFR 03/31/2013 >60.0   Final    Comment: Disease State Reference Ranges:                             Chronic Kidney Disease; < 60 ml/min/1.73 sq.m                             Kidney Failure; < 15 ml/min/1.73 sq.m                             [Calculated using IDMS-Traceable MDRD equation (based on                             gender, age and black vs. non-black race) recommended by                             [employer] Kidney Disease Education Program. No data                             available for non-white, non-black race.]                           GFR estimates are unreliable in patients with:                             Rapidly changing kidney  function or recent dialysis,                             extreme age, body size or body composition(obesity,                             severe malnutrition). Abnormal muscle mass (limb                             amputation, muscle wasting). In these patients,  alternative determinations of GFR should be obtained.   . Ventricular Rate 03/31/2013 56   Final   . Atrial Rate 03/31/2013 56   Final   . P-R Interval 03/31/2013 166   Final   . QRS Duration 03/31/2013 88   Final   . Q-T Interval 03/31/2013 432   Final   . QTC Calculation (Bezet) 03/31/2013 416   Final   . P Axis 03/31/2013 46   Final   . R Axis 03/31/2013 35   Final   . T Axis 03/31/2013 24   Final   . Glucose 04/01/2013 95  70 - 100 mg/dL Final    Comment: Interpretive Data for Adult Female and Female Population                           Indeterminate Range:  100-125 mg/dL                           Equal to or greater than 126 mg/dL meets the ADA                           guidelines for Diabetes Mellitus diagnosis if symptoms                           are present and confirmed by repeat testing.                           Random (Non-Fasting)Interpretive Data (Adults):                           Equal to or greater than 200 mg/dL meets the ADA                           guidelines for Diabetes Mellitus diagnosis if symptoms                           are present and confirmed by Fasting Glucose or GTT.   . BUN 04/01/2013 4.7* 7.0 - 19.0 mg/dL Final   . Creatinine 32/95/1884 0.7  0.6 - 1.0 mg/dL Final   . Sodium 16/60/6301 143  136 - 145 Final   . Potassium 04/01/2013 4.1  3.5 - 5.1 Final   . Chloride 04/01/2013 113* 98 - 107 Final   . CO2 04/01/2013 23  22 - 29 Final   . CALCIUM 04/01/2013 8.1* 8.5 - 10.5 mg/dL Final   . Protein, Total 04/01/2013 5.8* 6.0 - 8.3 g/dL Final   . Albumin 60/02/9322 3.0* 3.5 - 5.0 g/dL Final   . AST (SGOT) 55/73/2202 47* 5 - 34 U/L Final   . ALT 04/01/2013 118* 0 - 55 U/L Final   . Alkaline Phosphatase  04/01/2013 71  40 - 150 U/L Final   . Bilirubin, Total 04/01/2013 0.4  0.2 - 1.2 mg/dL Final   . Globulin 54/27/0623 2.8  2.0 - 3.6 g/dL Final   . Albumin/Globulin Ratio 04/01/2013 1.1  0.9 - 2.2 Final   . Anion Gap 04/01/2013 7.0  5.0 - 15.0 Final   . WBC 04/01/2013 7.52  3.50 - 10.80 Final   . RBC 04/01/2013 4.13* 4.20 - 5.40 Final   . Hgb  04/01/2013 12.0  12.0 - 16.0 g/dL Final   . Hematocrit 56/21/3086 37.2  37.0 - 47.0 % Final   . MCV 04/01/2013 90.1  80.0 - 100.0 fL Final   . MCH 04/01/2013 29.1  28.0 - 32.0 pg Final   . MCHC 04/01/2013 32.3  32.0 - 36.0 g/dL Final   . RDW 57/84/6962 14  12 - 15 % Final   . Platelets 04/01/2013 236  140 - 400 Final   . MPV 04/01/2013 10.4  9.4 - 12.3 fL Final   . Neutrophils 04/01/2013 57  None % Final   . Lymphocytes Automated 04/01/2013 28  None % Final   . Monocytes 04/01/2013 7  None % Final   . Eosinophils Automated 04/01/2013 7  None % Final   . Basophils Automated 04/01/2013 0  None % Final   . Immature Granulocyte 04/01/2013 0  None % Final   . Neutrophils Absolute 04/01/2013 4.31  1.80 - 8.10 Final   . Abs Lymph Automated 04/01/2013 2.13  0.50 - 4.40 Final   . Abs Mono Automated 04/01/2013 0.54  0.00 - 1.20 Final   . Abs Eos Automated 04/01/2013 0.52  0.00 - 0.70 Final   . Absolute Baso Automated 04/01/2013 0.02  0.00 - 0.20 Final   . Absolute Immature Granulocyte 04/01/2013 0.01  0 Final   . Lipase 04/01/2013 24  8 - 78 U/L Final   . EGFR 04/01/2013 >60.0   Final    Comment: Disease State Reference Ranges:                             Chronic Kidney Disease; < 60 ml/min/1.73 sq.m                             Kidney Failure; < 15 ml/min/1.73 sq.m                             [Calculated using IDMS-Traceable MDRD equation (based on                             gender, age and black vs. non-black race) recommended by                             [employer] Kidney Disease Education Program. No data                             available for non-white, non-black race.]                            GFR estimates are unreliable in patients with:                             Rapidly changing kidney function or recent dialysis,                             extreme age, body size or body composition(obesity,  severe malnutrition). Abnormal muscle mass (limb                             amputation, muscle wasting). In these patients,                             alternative determinations of GFR should be obtained.        Coagulation Profile: No results for input(s): PT, INR, PTT, APTT in the last 168 hours.       Medications:   Current Facility-Administered Medications   Medication Dose Route Frequency Last Rate Last Dose   . albuterol (PROVENTIL) nebulizer solution 2.5 mg  2.5 mg Nebulization Q6H PRN   2.5 mg at 12/01/13 0911   . azithromycin (ZITHROMAX) 500 mg in sodium chloride 0.9 % 250 mL IVPB  500 mg Intravenous Q24H SCH 250 mL/hr at 12/01/13 1021 500 mg at 12/01/13 1021   . diphenhydrAMINE (BENADRYL) injection 12.5 mg  12.5 mg Intravenous Q4H PRN       . enoxaparin (LOVENOX) syringe 40 mg  40 mg Subcutaneous Daily   40 mg at 12/01/13 1020   . HYDROmorphone (DILAUDID) injection 1 mg  1 mg Intravenous Q2H PRN       . ibuprofen (ADVIL,MOTRIN) tablet 400 mg  400 mg Oral Q6H PRN   400 mg at 11/30/13 1112   . meropenem (MERREM) 1,000 mg in sodium chloride 0.9 % 100 mL IVPB mini-bag plus  1,000 mg Intravenous Q8H 200 mL/hr at 12/01/13 0846 1,000 mg at 12/01/13 0846   . methylprednisolone sodium succinate (Solu-MEDROL) injection 40 mg  40 mg Intravenous Q8H SCH       . ondansetron (ZOFRAN) injection 4 mg  4 mg Intravenous Q4H PRN       . pantoprazole (PROTONIX) injection 40 mg  40 mg Intravenous Daily   40 mg at 12/01/13 1020   . promethazine (PHENERGAN) injection 12.5 mg  12.5 mg Intravenous Q4H PRN            CBC review:   Recent Labs  Lab 12/01/13  0607 11/29/13  0644 11/28/13  2240   WBC 8.35 14.59* 20.93*   HEMOGLOBIN 12.2 13.7 15.5   HEMATOCRIT 37.5 42.2 46.6   PLATELETS  211 257 273   MCV 89.5 88.5 88.8   RDW 14 14 14    NEUTROPHILS 48 74 90   LYMPHOCYTES AUTOMATED 34  --  4   EOSINOPHILS AUTOMATED 10  --  0   IMMATURE GRANULOCYTE 0  --  0   NEUTROPHILS ABSOLUTE 3.97  --  18.81*   ABSOLUTE IMMATURE GRANULOCYTE 0.03  --  0.08*        Chem Review:  Recent Labs  Lab 12/01/13  0607 11/30/13  0808 11/29/13  0644 11/28/13  2240   SODIUM 142 141 139 139   POTASSIUM 3.6 4.8 4.5 4.3   CHLORIDE 110 113* 106 104   CO2 27 22 26 23    BUN 8.0 8.4 11.4 13.1   CREATININE 0.7 0.8 0.8 0.9   GLUCOSE 93 83 131* 139*   CALCIUM 8.1* 7.8* 8.8 10.1   MAGNESIUM  --   --  2.0 1.6   PHOSPHORUS  --   --  2.7  --    BILIRUBIN, TOTAL 0.3 0.3 0.5 0.5   AST (SGOT) 27 44* 87* 120*   ALT 33 49 89* 83*  ALKALINE PHOSPHATASE 62 63 78 90

## 2013-12-01 NOTE — Progress Notes (Signed)
Patient with increased wheezing and SOB with exertion. Solu-medrol and scheduled neb treatments. Repeat Chest xray done. Plan to discharge home tomorrow.

## 2013-12-02 LAB — COMPREHENSIVE METABOLIC PANEL
ALT: 36 U/L (ref 0–55)
AST (SGOT): 48 U/L — ABNORMAL HIGH (ref 5–34)
Albumin/Globulin Ratio: 1 (ref 0.9–2.2)
Albumin: 3.2 g/dL — ABNORMAL LOW (ref 3.5–5.0)
Alkaline Phosphatase: 67 U/L (ref 37–106)
Anion Gap: 9 (ref 5.0–15.0)
BUN: 7.6 mg/dL (ref 7.0–19.0)
Bilirubin, Total: 0.2 mg/dL (ref 0.2–1.2)
CO2: 25 mEq/L (ref 22–29)
Calcium: 8.5 mg/dL (ref 8.5–10.5)
Chloride: 105 mEq/L (ref 100–111)
Creatinine: 0.7 mg/dL (ref 0.6–1.0)
Globulin: 3.2 g/dL (ref 2.0–3.6)
Glucose: 158 mg/dL — ABNORMAL HIGH (ref 70–100)
Potassium: 3.6 mEq/L (ref 3.5–5.1)
Protein, Total: 6.4 g/dL (ref 6.0–8.3)
Sodium: 139 mEq/L (ref 136–145)

## 2013-12-02 LAB — CBC AND DIFFERENTIAL
Basophils Absolute Automated: 0.01 10*3/uL (ref 0.00–0.20)
Basophils Automated: 0 %
Eosinophils Absolute Automated: 0 10*3/uL (ref 0.00–0.70)
Eosinophils Automated: 0 %
Hematocrit: 38 % (ref 37.0–47.0)
Hgb: 12.5 g/dL (ref 12.0–16.0)
Immature Granulocytes Absolute: 0.06 10*3/uL — ABNORMAL HIGH
Immature Granulocytes: 1 %
Lymphocytes Absolute Automated: 1.48 10*3/uL (ref 0.50–4.40)
Lymphocytes Automated: 15 %
MCH: 29 pg (ref 28.0–32.0)
MCHC: 32.9 g/dL (ref 32.0–36.0)
MCV: 88.2 fL (ref 80.0–100.0)
MPV: 10.5 fL (ref 9.4–12.3)
Monocytes Absolute Automated: 0.16 10*3/uL (ref 0.00–1.20)
Monocytes: 2 %
Neutrophils Absolute: 8.33 10*3/uL — ABNORMAL HIGH (ref 1.80–8.10)
Neutrophils: 84 %
Platelets: 256 10*3/uL (ref 140–400)
RBC: 4.31 10*6/uL (ref 4.20–5.40)
RDW: 14 % (ref 12–15)
WBC: 9.98 10*3/uL (ref 3.50–10.80)

## 2013-12-02 LAB — GFR: EGFR: >60

## 2013-12-02 LAB — LIPASE: Lipase: 14 U/L (ref 8–78)

## 2013-12-02 MED ORDER — PREDNISONE 10 MG PO TABS
ORAL_TABLET | ORAL | Status: DC
Start: 2013-12-02 — End: 2017-03-28

## 2013-12-02 MED ORDER — LEVOFLOXACIN 750 MG PO TABS
750.0000 mg | ORAL_TABLET | Freq: Every day | ORAL | Status: DC
Start: 2013-12-02 — End: 2017-03-28

## 2013-12-02 NOTE — Discharge Summary -  Nursing (Signed)
Pt discharged via wheelchair with family to home. Discharge instructions and meds informations discussed and understood. Pt will follow up with PCP as directed.

## 2013-12-02 NOTE — Plan of Care (Signed)
Problem: Safety  Goal: Patient will be free from injury during hospitalization  Outcome: Not Progressing  Call bell and personal belongings within reach, pt independent. Calls for assistance when needed. Hourly rounding done.    Problem: Pain  Goal: Patient's pain/discomfort is manageable  Outcome: Progressing  Pt not in pain at this time. Will continue to monitor.    Problem: Moderate/High Fall Risk Score >/=15  Goal: Patient will remain free of falls  Outcome: Progressing  Nonskid socks on, bed at lowest level and locked. Calls for assistance when needed. Hourly rounding done.

## 2013-12-02 NOTE — Plan of Care (Signed)
Pain Management Plan    Education about your Pain Management.    Dear [name],    It is my pleasure to care for you during your hospitalization here at [facility]. You have reported that you are not experiencing pain at this time. If at any point you experience pain, please notify a member of the healthcare team. We are committed to meeting your unique needs during your hospitalization and can adjust your plan of care accordingly.      Thank you for your time.    Rhae Lerner, RN  12/02/2013  8:30 AM  [facility]  [address]  [place], [place]  [address]

## 2013-12-02 NOTE — Progress Notes (Signed)
Infectious Disease            Progress Note    12/02/2013   Jenne Sellinger [id number] is a 68 y.o. female, with past medical history significant for gallstone pancreatitis, history of cholecystectomy, history of appendectomy, hysterectomy, admitted with pancreatitis, dental infection.    Subjective:     Chieko Neises today Symptoms: No more wheezing, improved shortness of breath, abdominal pain. Complains of cough.  No dysuria, increase frequency. No headache, dizziness or new weakness tingling or numbness. No Rashes or ecchymosis.  Other review of system is non contributory.    Objective:     Blood pressure 113/57, pulse 51, temperature 97.5 F (36.4 C), temperature source Temporal Artery, resp. rate 16, height 1.6 m (5\' 3" ), weight 81.4 kg (179 lb 7.3 oz), SpO2 92 %.    General Appearance:   Looks comfortable  HEENT: Pallor negative, Anicteric sclera.  Pupils are equal, round, and reactive to light.   Lungs:  Decreased breath sound at bases  Heart: S1 and S2.    Chest Wall: Symmetric chest wall expansion.   Abdomen: Abdomen is soft, scaphoid and non-distended. There are no signs of ascites. Bowel sounds are normal.  Mild epigastric discomfort  Neurological: Patient is alert and oriented to person, place and time.  Normal strength. No gross defect.   Extremities: Normal range of motion.  Skin:  Warm and dry.  No rash or ecchymosis.   Psychiatric: Mood and affect is normal    Laboratory And Diagnostic Studies:     Recent Labs      12/02/13   0528  12/01/13   0607   WBC  9.98  8.35   HEMOGLOBIN  12.5  12.2   HEMATOCRIT  38.0  37.5   PLATELETS  256  211   NEUTROPHILS  84  48     Recent Labs      12/02/13   0528  12/01/13   0607   SODIUM  139  142   POTASSIUM  3.6  3.6   CHLORIDE  105  110   CO2  25  27   BUN  7.6  8.0   CREATININE  0.7  0.7   GLUCOSE  158*  93   CALCIUM  8.5  8.1*     Recent Labs      12/02/13   0528  12/01/13   0607   AST (SGOT)  48*  27   ALT  36  33   ALKALINE PHOSPHATASE  67   62   PROTEIN, TOTAL  6.4  5.8*   ALBUMIN  3.2*  3.0*   BILIRUBIN, TOTAL  0.2  0.3     X-ray chest: 1. Stable patchy lingular consolidation. New mild patchy peripheral  right mid lung opacities. Findings suggest multifocal pneumonia.  2. Mild curvilinear opacities in the basilar lower lobes, favor  subsegmental atelectasis.  3. Stable trace bilateral pleural effusions.    Current Med's:     Current Facility-Administered Medications   Medication Dose Route Frequency   . albuterol  2.5 mg Nebulization Q4H SCH   . azithromycin  500 mg Intravenous Q24H SCH   . enoxaparin  40 mg Subcutaneous Daily   . meropenem  1,000 mg Intravenous Q8H   . methylprednisolone  40 mg Intravenous Q8H SCH   . pantoprazole  40 mg Intravenous Daily         Assessment:      Condition.  Guarded   Pancreatitis; resolved  Pneumonia   Dental infection    Plan:      Continue meropenem   Continue Zithromax   Continue supportive care   Discussed with Dr. Vladimir Faster   Can be discharged from ID perspective on p.o. Levaquin with close outpatient monitoring          Alfonzo Beers, M.D.,FACP  12/02/2013  9:35 AM

## 2013-12-02 NOTE — Discharge Summary (Signed)
[facility] Hospitalist Discharge Note      Date Time: 12/02/2013  2:23 PM  Patient Name:Lindsay Elliott  WUJ:[id number]  PCP: Gregor Hams, MD (General)  Admit Date:11/28/2013  Discharge Date:12/02/2013  Attending Physician:Mayline Dragon, Donnita Falls, MD    Hospital Course:   Please see H&P for complete details of HPI and ROS. The patient was admitted to [facility] and has been diagnosed with the following conditions and has been taken care as mentioned below.    Patient Active Problem List    Diagnosis Date Noted   . Leukocytosis 11/29/2013   . Elevated blood pressure reading without diagnosis of hypertension 11/29/2013   . Hyperglycemia 11/29/2013   . Acute pancreatitis 03/31/2013   . Elevated LFTs 03/31/2013    68 year old female with history of status post laparoscopic cholecystectomy for gallstone pancreatitis presented with abdominal pain, nausea, vomiting on November 29, 2013.  Her lipase noted to be 3894 on admission with the mildly elevated transaminases and leukocytosis of 20,000 with subjective fever and chills.  She was started on empiric  Antibiotics, given aggressive IV hydration, bowel rest and IV pain control.  MRCP ordered which showed no intrahepatic biliary dilatation and mildly dilated common bile duct but no pancreatic mass or pancreatic ductal dilatation.  Her lipase level with IV hydration trended down gradually to normal.  Pancreatitis, resolved, able to tolerate diet.  She was treated with IV meropenem empirically. She was noted to have severe shortness of breath with wheezing, chest x-ray showed multifocal pneumonia.  Started her on azithromycin along with IV meropenem.  IV Solu-Medrol started, which improved her wheezing.  Follow-up chest x-ray showed pneumonia findings.  She is medically stable and able to ambulate today without needing any oxygen .  Discharged her home on quick taper prednisone 40 mg decreasing by 10 mg until it and levofloxacin 750 mg for 7 days.  She can follow up with  primary care physician in one week.  Type of Admission:inpatient   Medical Necessity for stay:pneumonia ,acute pancreatitis     Date of Admission:   11/28/2013  Date of Discharge:   12/02/2013  Chief Complaint:      Chief Complaint   Patient presents with   . Emesis     Discharge Diagnosis:   Hospital Problems:  Principal Problem:    Acute pancreatitis  Active Problems:    Elevated LFTs    Leukocytosis    Elevated blood pressure reading without diagnosis of hypertension    Hyperglycemia    Lists the present on admission hospital problems  Present on Admission:   . Acute pancreatitis  . Elevated LFTs  . Leukocytosis  . Elevated blood pressure reading without diagnosis of hypertension  . Hyperglycemia  Consult Input/Plan   Plan  IP CONSULT TO INFECTIOUS DISEASES  Procedures performed:   No orders of the defined types were placed in this encounter.     Physical Exam:    height is 1.6 m (5\' 3" ) and weight is 81.4 kg (179 lb 7.3 oz). Her temporal artery temperature is 97.2 F (36.2 C). Her blood pressure is 127/56 and her pulse is 76. Her respiration is 20 and oxygen saturation is 95%.   Body mass index is 31.8 kg/(m^2).  Filed Vitals:    12/02/13 0600 12/02/13 0720 12/02/13 1011 12/02/13 1329   BP:   118/56 127/56   Pulse:  51 65 76   Temp:   97 F (36.1 C) 97.2 F (36.2 C)   TempSrc:  Temporal Artery Temporal Artery   Resp:  16 16 20    Height:       Weight: 81.4 kg (179 lb 7.3 oz)      SpO2:   94% 95%     Intake and Output Summary (Last 24 hours) at Date Time    Intake/Output Summary (Last 24 hours) at 12/02/13 1423  Last data filed at 12/02/13 0800   Gross per 24 hour   Intake   5410 ml   Output      0 ml   Net   5410 ml     Genital exam not in acute distress.  Neck supple.  No thyromegaly.  HEENT atraumatic, normocephalic   chest -- clear to auscultation bilaterally.  No wheezing  CVS no murmurs.  S1, S2 present.  Abdomen soft, nontender, no organomegaly.  Bowel sounds present  Neurologic no gross focal neurological  deficits.        Labs:   I have reviewed the labs  Results    Procedure Component Value Units Date/Time    Legionella antigen, urine [[id number]] Collected:  12/02/13 1345    Specimen Information:  Urine / Urine, Clean Catch Updated:  12/02/13 1419    STREP PNEUMONIA, RAPID URINARY ANTIGEN [[id number]] Collected:  12/02/13 1345    Specimen Information:  Urine, Clean Catch Updated:  12/02/13 1419    Blood Culture Aerobic/Anaerobic #2 [[id number]] Collected:  11/29/13 0644    Specimen Information:  Blood Updated:  12/02/13 1321    Narrative:      ORDER#: [id number]                                    ORDERED BY: MALACHIAS, ZACH  SOURCE: Blood Peripheral, Left                       COLLECTED:  11/29/13 06:44  ANTIBIOTICS AT COLL.:                                RECEIVED :  11/29/13 12:37  Culture Blood Aerobic and Anaerobic        PRELIM      12/02/13 13:21  11/30/13   No Growth after 1 day/s of incubation.  12/01/13   No Growth after 2 day/s of incubation.  12/02/13   No Growth after 3 day/s of incubation.      Comprehensive metabolic panel [[id number]]  (Abnormal) Collected:  12/02/13 0528    Specimen Information:  Blood Updated:  12/02/13 0634     Glucose 158 (H) mg/dL      BUN 7.6 mg/dL      Creatinine 0.7 mg/dL      Sodium 347 mEq/L      Potassium 3.6 mEq/L      Chloride 105 mEq/L      CO2 25 mEq/L      CALCIUM 8.5 mg/dL      Protein, Total 6.4 g/dL      Albumin 3.2 (L) g/dL      AST (SGOT) 48 (H) U/L      ALT 36 U/L      Alkaline Phosphatase 67 U/L      Bilirubin, Total 0.2 mg/dL      Globulin 3.2 g/dL      Albumin/Globulin Ratio 1.0      Anion Gap 9.0  Lipase [[id number]] Collected:  12/02/13 0528    Specimen Information:  Blood Updated:  12/02/13 0634     Lipase 14 U/L     GFR [[id number]] Collected:  12/02/13 0528     EGFR >60.0 Updated:  12/02/13 0634    CBC and differential [[id number]]  (Abnormal) Collected:  12/02/13 0528    Specimen Information:  Blood / Blood Updated:  12/02/13 0615     WBC 9.98 x10 3/uL       RBC 4.31 x10 6/uL      Hgb 12.5 g/dL      Hematocrit 62.9 %      MCV 88.2 fL      MCH 29.0 pg      MCHC 32.9 g/dL      RDW 14 %      Platelets 256 x10 3/uL      MPV 10.5 fL      Neutrophils 84 %      Lymphocytes Automated 15 %      Monocytes 2 %      Eosinophils Automated 0 %      Basophils Automated 0 %      Immature Granulocyte 1 %      Neutrophils Absolute 8.33 (H) x10 3/uL      Abs Lymph Automated 1.48 x10 3/uL      Abs Mono Automated 0.16 x10 3/uL      Abs Eos Automated 0.00 x10 3/uL      Absolute Baso Automated 0.01 x10 3/uL      Absolute Immature Granulocyte 0.06 (H) x10 3/uL     Blood Culture Aerobic/Anaerobic #1 [[id number]] Collected:  11/28/13 2343    Specimen Information:  Blood Updated:  12/02/13 0421    Narrative:      ORDER#: [id number]                                    ORDERED BY: MALACHIAS, ZACH  SOURCE: Blood RT AC                                  COLLECTED:  11/28/13 23:43  ANTIBIOTICS AT COLL.:                                RECEIVED :  11/29/13 03:37  Culture Blood Aerobic and Anaerobic        PRELIM      12/02/13 04:21  11/30/13   No Growth after 1 day/s of incubation.  12/01/13   No Growth after 2 day/s of incubation.  12/02/13   No Growth after 3 day/s of incubation.          Rads:   I have reviewed the Radiology reports.  Xr Chest 2 Views    12/01/2013   HISTORY: Pneumonia.    COMPARISON: Chest radiograph from one day prior.  PA and lateral views of the chest. The heart is stable and top normal size. The mediastinal contour is stable. There is no pneumothorax. Trace bilateral pleural effusions are stable. There are new minimal patchy opacities in the peripheral right mid lung. There is stable patchy consolidation in the lingula. Mild curvilinear opacities at the bilateral lower lobe bases likely represent atelectasis. Moderate degenerative changes are present in the thoracic spine. Cholecystectomy clips are seen in  the right upper abdomen.     12/01/2013      1. Stable patchy lingular  consolidation. New mild patchy peripheral right mid lung opacities. Findings suggest multifocal pneumonia.  2. Mild curvilinear opacities in the basilar lower lobes, favor subsegmental atelectasis.  3. Stable trace bilateral pleural effusions.  Cleone Slim, MD  12/01/2013 1:56 PM     Xr Chest 2 Views    11/30/2013   HISTORY: Wheezing and shortness of breath    COMPARISON: 11/28/2013 chest radiograph.  PA and lateral views of the chest. The heart is stable and top normal size. The mediastinal contour is stable and within normal limits. There is no pneumothorax or pleural effusion. There is worsening patchy opacity in the lingula. Mild curvilinear opacities are present in the lower lobes, favor atelectasis. Moderate degenerative changes are present in the thoracic spine.     11/30/2013      1. Worsening patchy opacity in the lingula, suspicious for lingular pneumonia.  2. Mild curvilinear opacities in both lower lobes, favor atelectasis, with a component of pneumonia not excluded.  Continued follow-up chest imaging is advised.  Cleone Slim, MD  11/30/2013 1:16 PM     Ct Abd/ Pelvis With Iv Contrast    11/29/2013   INDICATION: upper ap and emesis.  68 year old female with history of appendectomy, cholecystectomy and hysterectomy.       COMPARISON: CT abdomen pelvis 03/30/2013 and 02/09/2010.         TECHNIQUE: CT ABDOMEN PELVIS W IV/ WO PO CONT: Helical imaging was performed on a multislice CT scanner without oral contrast. Axial scans were obtained from the xiphoid through the symphysis pubis.  Dynamic imaging was done during injection of  100 cc  Omnipaque 350.  Acquisition phase of imaging: Portal phase of contrast enhancement. Reconstructions: MPR Coronal and sagittal.         FINDINGS:     The lung bases are clear except for minimal atelectasis.     A probable left renal cyst is noted.  The liver, spleen, pancreas, adrenal glands, and kidneys show no other apparent masses. The 12 mm hypodense lesion in the midpole left kidney  is too small to precisely characterize but likely represents a cyst; it is only slightly larger than in 2011.  The gallbladder has been removed.  The bile ducts are nondilated.      The portal vein, SMV and other central mesenteric vessels are patent.  There is no CT evidence of pancreatitis.     The kidneys show no calculi, hydronephrosis or findings of pyelonephritis.      The abdominal aorta is calcified but not aneurysmal.      There is no retroperitoneal adenopathy, hemorrhage or mass.      There is no bowel dilatation or definite wall thickening.       No mesenteric abnormality is seen.      There is no intraperitoneal free air or ascites. The uterus has been removed. There is no appreciable adnexal mass. The urinary bladder is unremarkable. The abdominal wall soft tissues are unremarkable.        Spinal degenerative changes are noted.                           11/29/2013     CT scan of the abdomen and pelvis shows no acute abnormality.       Incidental note is made of: Cholecystectomy, hysterectomy and a probable  small left renal cyst. Other findings as noted above.                              Miguel Dibble, MD  11/29/2013 1:29 AM     Chest Ap Portable    11/28/2013   HISTORY: Abdominal pain.    COMPARISON: No prior chest imaging. CT abdomen from 03/30/2013.  Portable chest radiograph. The heart is stable and top normal size. The mediastinal contour is normal. There is no pneumothorax or pleural effusion. Patchy lung opacities are noted in the medial lung bases bilaterally, with mild associated volume loss. There is no pulmonary edema.     11/28/2013      Patchy lung opacities in the medial lung bases bilaterally, with mild associated volume loss, suggesting lower lobe atelectasis.  Cleone Slim, MD  11/28/2013 11:01 PM     Mri Abdomen W Wo Contrast Mrcp    11/29/2013   HISTORY: 68 year old female status post laparoscopic cholecystectomy on 04/01/2013, now presents with acute pancreatitis. Normal serum total bilirubin 0.5.  Mild transaminitis.  COMPARISON: CT abdomen/pelvis from earlier today.  TECHNIQUE: MRI and MRCP of the abdomen was performed with multiple sequences in multiple planes, before and after the intravenous administration of gadolinium contrast.  CONTRAST: 10 mL Gadavist IV.   FINDINGS:  Liver: Normal liver size. No liver mass. There is mild heterogeneous diffuse hepatic steatosis on chemical shift imaging. Gallbladder: Surgically absent. Bile Ducts: No intrahepatic biliary ductal dilatation. Mildly dilated common bile duct (8mm diameter), within expected postcholecystectomy limits. No choledocholithiasis. No biliary stricture detected. Pancreas: No main pancreatic duct dilation. No pancreatic mass. No intrapancreatic or peripancreatic fluid collection. Pancreatic parenchymal signal intensity on T1 imaging is normal. No evidence of pancreas divisum. Spleen: Normal. Adrenals: Normal. Kidneys: Simple 1.1 cm renal cyst in the posterior left mid kidney. No hydronephrosis.  Peritoneum: No focal intra-abdominal fluid collection. No ascites. Lymph Nodes: No lymphadenopathy. Abdominal Aorta: Normal caliber.     11/29/2013     1. Status post cholecystectomy. No intrahepatic biliary ductal dilatation. Mildly dilated common bile duct, within expected postcholecystectomy limits. No choledocholithiasis. No biliary stricture detected.  2. No MRI findings of acute pancreatitis. No pancreatic duct dilation. No evidence of pancreas divisum. No pancreatic mass.  3. Mild heterogeneous diffuse hepatic steatosis. No liver mass.  Cleone Slim, MD  11/29/2013 2:06 PM     Discharge Medications:        Discharge Medication List      Taking         acetaminophen 500 MG tablet   Dose:  500 mg   Commonly known as:  TYLENOL   Take 1 tablet (500 mg total) by mouth every 4 (four) hours as needed.       levofloxacin 750 MG tablet   Dose:  750 mg   Commonly known as:  LEVAQUIN   Take 1 tablet (750 mg total) by mouth daily.         prednisone tapering dose      Pending Labs:           Unresulted Labs    Procedure . . . Date/Time    Legionella antigen, urine [[id number]] Collected:  12/02/13 1345    Specimen Information:  Urine / Urine, Clean Catch Updated:  12/02/13 1419    STREP PNEUMONIA, RAPID URINARY ANTIGEN [[id number]] Collected:  12/02/13 1345    Specimen Information:  Urine, Clean Catch  Updated:  12/02/13 1419         Discharge Destination:   Home  Condition at Discharge :   Stable  Labs/Images to be followed at your PCP office   Urine for Legionella, urine for strep pneumoniae  Follow-up:     Follow-up Information    Follow up with Gregor Hams, MD In 1 week.    Specialty:  Family Medicine    Contact information:    [phone] [address]  [place] [address]  [phone]           Time spent for Discharge Care:   45 minutes      Signed by: Marella Chimes, MD

## 2013-12-02 NOTE — Discharge Instructions (Signed)
[email or web address] Program    Education about New Medications and their Side effects    Dear Lindsay Elliott,    Its been a pleasure taking care of you during your hospitalization here at [facility]. We have initiated a new program to educate our patients and/or their family members or designated personnel about the new medications started by your physicians and their indications along with the possible side effects. Multiple studies have shown that patients started on new medications are often unaware of the names of the medication along with the indications and their side effects which leads to decreased compliance with the medications.    During our conversation today on 12/02/2013  3:19 PM I have explained to you the name of the new medication and the indication along with some possible common side effects. Listed below are some of the new medications started during this hospitalization.     Please call the Nurse if you have any side effects while in hospital.     Please call 911 if you have any life threatening symptoms after you are discharged from the hospital.    Please inform your Primary care physician for common side effects which are not life threatening after discharge.    Medication: Levofloxacin(Levaquin)   This Medication is used for:   Bacterial Infections    Common Side Effects are:   Nausea   Diarrhea   Upset Stomach    A note from your nurse:  Call your nurse immediately if you notice itching, hives, swelling or trouble breathing     Medication: Prednisone(Deltasone)   This Medication is used for:   Allergies   COPD   Asthma   Autoimmune conditions    Common Side Effects are:   High Blood sugars   High Blood pressure   Agitation   Upset stomach    A note from your nurse:  Call your nurse immediately if you notice itching, hives, swelling or trouble breathing       Thank you for your time.    Rhae Lerner, RN  12/02/2013  3:19 PM  [facility]  [address]  [place], [place]  [address]

## 2016-02-23 ENCOUNTER — Ambulatory Visit: Payer: BLUE CROSS/BLUE SHIELD | Attending: Physical Medicine & Rehabilitation

## 2016-02-23 ENCOUNTER — Ambulatory Visit: Payer: BLUE CROSS/BLUE SHIELD | Admitting: Physical Medicine & Rehabilitation

## 2016-02-23 DIAGNOSIS — M542 Cervicalgia: Secondary | ICD-10-CM | POA: Insufficient documentation

## 2016-02-23 DIAGNOSIS — S060X1D Concussion with loss of consciousness of 30 minutes or less, subsequent encounter: Secondary | ICD-10-CM | POA: Insufficient documentation

## 2016-02-23 DIAGNOSIS — S060X1A Concussion with loss of consciousness of 30 minutes or less, initial encounter: Secondary | ICD-10-CM

## 2016-02-23 DIAGNOSIS — G44309 Post-traumatic headache, unspecified, not intractable: Secondary | ICD-10-CM | POA: Insufficient documentation
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
## 2016-03-01 ENCOUNTER — Ambulatory Visit: Payer: BLUE CROSS/BLUE SHIELD | Attending: Physical Medicine & Rehabilitation

## 2016-03-01 DIAGNOSIS — S060X0D Concussion without loss of consciousness, subsequent encounter: Secondary | ICD-10-CM

## 2016-03-01 DIAGNOSIS — S134XXD Sprain of ligaments of cervical spine, subsequent encounter: Secondary | ICD-10-CM

## 2016-03-01 DIAGNOSIS — X58XXXD Exposure to other specified factors, subsequent encounter: Secondary | ICD-10-CM | POA: Insufficient documentation

## 2016-03-01 DIAGNOSIS — S069X1D Unspecified intracranial injury with loss of consciousness of 30 minutes or less, subsequent encounter: Secondary | ICD-10-CM | POA: Insufficient documentation

## 2016-03-01 NOTE — Progress Notes (Signed)
[facility]  [address] [place] [address]  Phone: [phone]      Fax: [phone]    PHYSICAL THERAPY DAILY NOTE - CONCUSSION MANAGEMENT            REFERRED BY: [name], MD    PATIENT: [name] DOB: [birth year]   MR #: [id number]  AGE: 70 y.o.    FACILITY PROVIDER #: [phone] PRIMARY MD: Gregor Hams, MD      Date of Service PT Received On: 03/01/16   Treatment Time Start Time: 0910 to Stop Time: 1000   Time Calculation Time Calculation (min): 50 min   Visit # PT Visit  PT Visit Number: 2/25   Units Billed   Therapeutic Interventions  $ PT Neuromuscular Re-education [phone]): 3 Units     [name] referred for physical therapy services by: [name], MD    Certification period, precautions, medications, allergies and baseline testing status copied from initial evaluation - reviewed and reconciled today.  Sharol Given, PT 03/01/2016  Patient reports no changes to medication.    CERTIFICATION DATES:   02/23/2016 - 05-24-16  Start of Care:  02/23/2016  Follow up with Medical Provider by 03-24-16 if not discharged before date.  Date of Injury:   January 06 2016    HISTORY OF ASTHMA?  no    RISK FACTORS THAT MAY PREDISPOSE PATIENT TO PROLONGED RECOVERY:  None.    IMPACT TEST ON FILE?  No Baseline testing performed.    Treatment Diagnosis:    Concussion with LOC <30 minutes  S06.0X1.D  Cervicalgia (neck pain)  M54.2  Post Traumatic Headache G44.3    SHORT TERM GOALS:  To be met by 03/01/16.  1.  The patient will be educated in proper rest and nutrition strategies to assist in recovery process.  2.  The patient will be educated on the pathophysiology of concussion and the goals of the recovery recommendations in an effort to support compliance and understanding.    LONG TERM GOALS:  To be met by 05/24/16  3.  The patient will report no subjective symptoms and be able to participate in a full academic and/or work day without  symptoms indicating appropriate progress toward recovery  4.  The patient will be free of objective symptoms indicating a return to prior level of function.  5.  The patient will be taken through the return to play (RTP) protocol successfully demonstrating recovery from injury and return to prior level of function.  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    PCSS / Pain Assessment:  Symptom 03/01/2016   Headache 5 - occipital area   Nausea 3   Vomiting 0   Balance problems 4   Dizziness 0   Lightheadedness 0   Fatigue 2   Trouble falling asleep 0   Sleeping more than usual 0   Sleeping less than usual 4   Drowsiness 0   Sensitivity to light 0  Sensitivity to noise 0   Irritability 5   Sadness 0   Nervous / Anxious 0   Feeling more emotional 0   Numbness or tingling 0   Feeling slowed down 4   Difficulty concentrating 4   Difficulty remembering 4   Visual problems 0   Other 0   Total Score: 35   PCSS is increased since last visit.    Patient's SUBJECTIVE symptom report most closely aligns with the following symptom trajectory pattern:  PATIENT PRIOIRTY 1 PHYSICAL Headaches Post Traumatic Migraines Cervical presentation    Clinical Findings:  Corrective Eyewear:  Yes, for driving  Wearing today? Yes  TEST RESULTS COMMENTS   CERVICAL EXAM    WNL WNL Prev Visit Imp NT    Cervical AROM []  []  []  [x]     Cervical Isometrics []  []  []  [x]     Palpation []  []  [x]  []  Significant TTP along suboccipital area.  No TTP along occiput.   Sharps Purser []  []  []  [x]     Alar Ligament []  []  []  [x]     Cervical Kinesthes. []  []  []  [x]      []  []  []  []      []  []   []  []       SIGHT AND FUNCTIONAL VISION    WNL WN PrevVisit Imp NT    Smooth Pursuits  Saccadic? []  [x]  []  []     EOM / ROM []  [x]  []  []     Saccades  H  Hypermetric  hypometric []  []  []  [x]     Saccades  V  Hypermetric  Hypometric []  [x]  []  []     Acuity B []  []  []  [x]     Acuity R []  []  []  [x]     Acuity L    []  []  []  [x]     NPC  Insufficiency? []  []  []  [x]     Accomm  R []  [x]  []  []     Accomm  L []  [x]  []  []     Cover / Uncover  Phorias?  Eso / Exo?      []  []  []  [x]      []  []  []  []        []  []  []  []       VISUAL / VESTIBULAR    WNL WNL Prev Visit Imp NT    VOR - H []  []  []  [x]     VOR - V []  []  []  [x]     VOR-C []  []  []  [x]     VOG []  []  []  [x]      []  []  []  []      []  []  []  []        BALANCE    WNL WNL Prev Visit Imp NT    MCTSIB - 1 []  []  []  [x]     MCTSIB - 2  []  []  []  [x]     MCTSIB - 3 []  []  []  [x]     MCTSIB - 4 []  []  []  [x]     Tandem Gait []  []  []  [x]     Tandem w/ Cog []  []  []  [x]     Gait - H turns []  []  []  [x]     Gait - V turns []  []  []  [x]     Neurocom SOT []  []  []  [x]      []  []  []  []       PHYSIOLOGICAL PERFORMANCE   Supine  Positional Testing Reveals:  Not tested today.   Standing x1'     Standing x2'     Modified Balke TT WNL  []       Impaired  []   NT  []         IMPACT TESTING    WNL WNL Prev Visit Imp NT Impact Test administered to assess for symptom provocation with cognitive exertion.   ImPact Post- Injury Test []  []  []  []       INTERVENTION:   Treatment Today:  Re-assessment of objective impairments noted last visit performed - see above for details.    Interventions Performed:  1.  Review of recovery protocol for maximal compliance.  Patient arrives today having only coffee for breakfast, not taking daily walks, just started melatonin last night.  Reviewed all key concepts and patient demonstrated an understanding of concepts as well as the why they are needed.      2.  Ice pack applied to cervical spine during education session above, patient reports relief with ice pack.    3.  Manual therapy to cervical spine:   Patient with reports of increased pain with supine positioning.  Palpation to occiput area unremarkable.  Gentle supine rocking of occiput does not provoke pain or feelings of instability.  Gentle pressure on C2 while head sinks into mat table does not provoke feelings of instability or pain.   Gentle Suboccipital release over 5 minutes.  Too much pressure initially created increase in symptoms, able to tolerate light pressure and gradually progress over time.     Attempted gentle manual traction, however symptoms increased initially.  Went back to suboccipital release another 5 minutes and then able to perform a very gentle manual traction for a 5 minute hold.    Patient reports a decrease in headache after manual therapy.  Decreased painful grimacing noted as well.    EVALUATION AND DIAGNOSIS:   Lindsay Elliott is a 70 y.o. female presenting to clinic today with persistent symptoms of concussion. Chief complaints are throbbing headache that is worse with supine positioning and wakes patient several times a night.  Headaches appear to be provoked / exacerbated by cervical dsyfunction.    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  Recommend RTP STAGE: 1 - Daily leisurely walks.     Patient's OBJECTIVE findings today most closely align with the following clinical trajectory pattern:  1: Cervical:  continue manual thearpy and modalities to cervical spine to reduce pain / dysfunction.    Functional Limitations include:  Patient was hired for full time work, however only able to participate in 3 days/week due to headaches/symptoms.  Patient is unable to sleep through night due to headaches.  Symptoms prevent patient from fulfilling job duties at prior level of function.    Disabilities:  Patient will be unable to fulfill work duties at prior level without recovery from injury.  Patient is at risk for further / permanent impairment if not  fully recovered from injury.                 PLAN:  PROGRESS TOWARD DISCHARGE CRITERIA   Symptom free at rest, or at previous subjective symptom baseline. No   Symptom free with cognitive exertion.   No  Patient has not successfully completed full day of non-physical work duties without increase in symptoms.   Successfully completed at least 24 hours on the GREEN stage at school or work. No   Normalized objective findings. No  See above objective testing notes for details.   Completed the RTP-3 readiness checklist (see scanned checklist in chart) and is ready to advance  to RTP-3. No, patient is not ready to advance to RTP-3.     Patient has not successfully completed all required steps above for discharge from physical therapy.    Patient does require continued skilled physical therapy intervention to progress toward above stated goals.        Recommendations:   Continue with established plan of care.    NEXT VISIT:  Manual therapy and modalities to cervical spine to reduce cervicogenic headaches.  Review sleep posture for maximal outcomes.    Therapist Signature:    Adela Ports, [place]  #[id number]  Physical Therapist      03/01/2016

## 2016-03-01 NOTE — Progress Notes (Signed)
[facility]  [address] [place] [address]  Phone: [phone]      Fax: [phone]    Recommendations for Lindsay Elliott     Take Melatonin and a protein snack - greek yogurt or protein shake - one hour before bed.     Make sure you are supporting your brains recovery by:   Taking a leisurely walk daily of at least 15 minutes.   Carrying a water bottle with you everywhere - drink at least 64 ounces daily.   Eating a light protein snack every 2-3 hours.   Trying your best to get a good nights sleep.      Adela Ports, [place]  #[id number]  03/01/2016

## 2016-03-03 ENCOUNTER — Ambulatory Visit: Payer: BLUE CROSS/BLUE SHIELD

## 2016-03-03 DIAGNOSIS — S060X1D Concussion with loss of consciousness of 30 minutes or less, subsequent encounter: Secondary | ICD-10-CM

## 2016-03-03 DIAGNOSIS — G4486 Cervicogenic headache: Secondary | ICD-10-CM

## 2016-03-03 DIAGNOSIS — M542 Cervicalgia: Secondary | ICD-10-CM

## 2016-03-03 NOTE — Progress Note - Problem Oriented Charting Notewrit (Signed)
[facility]  [address] [place] [address]  Phone: [phone]      Fax: [phone]    PHYSICAL THERAPY DAILY NOTE            REFERRED BY: Lindsay Moynahan, MD    PATIENT: Lindsay Elliott DOB: [birth year]   MR #: [id number]  AGE: 70 y.o.    FACILITY PROVIDER #: [phone] PRIMARY MD: Lindsay Hams, MD      Date of Service PT Received On: 03/03/16   Treatment Time Start Time: 0800 to Stop Time: 0910   Time Calculation Time Calculation (min): 70 min   Visit # PT Visit  PT Visit Number: 3/25   Units Billed   Therapeutic Interventions  $ PT Electrical Stimulation- Unattended (G0283): 1 Procedure  $ PT Therapeutic Exercise (97110): 1 Unit  $ PT Manual Therapy (97140): 2 Unit  $ PT Therapeutic Activity [phone]): 1 unit     Medications:  Tomasina Keasling has a current medication list which includes the following prescription(s): acetaminophen, levofloxacin, and prednisone.  Patient reports no changes to medication.    Certification dates, start of care, date of injury, treatment diagnosis, prognostic risk factors, and Impact testing copied from last visit:  CERTIFICATION DATES:02/23/2016- 05-24-16  Start of Care: 02/23/2016  Follow up with Medical Provider by10-26-17if not discharged before date.  Date of Injury: August 92017    HISTORY OF ASTHMA? no    RISK FACTORS THAT MAY PREDISPOSE PATIENT TO PROLONGED RECOVERY:  None.    IMPACT TEST ON FILE? No Baseline testing performed.    Treatment Diagnosis:   Concussion with LOC <30 minutes S06.0X1.D  Cervicalgia (neck pain) M54.2  Post Traumatic Headache G44.3    SUBJECTIVE:   Patient reports that she has a hard time staying asleep due to headaches.    PCSS / Pain Assessment:  Symptom 03/03/2016   Headache 4 (headache wakes pt up at night)   Nausea    Vomiting    Balance problems 2   Dizziness    Lightheadedness    Fatigue 2   Trouble falling asleep    Sleeping more than usual    Sleeping less than  usual 4 (due to headaches)   Drowsiness    Sensitivity to light    Sensitivity to noise    Irritability 4   Sadness    Nervous / Anxious    Feeling more emotional    Numbness or tingling    Feeling slowed down 4   Difficulty concentrating 4   Difficulty remembering 4   Visual problems    Other    Total Score: 28   PCSS is improving since last visit.    Patient's SUBJECTIVE symptom report most closely aligns with the following symptom trajectory pattern:  PATIENT PRIOIRTY 1 PHYSICAL Headaches Cervical presentation intermittent - comes and goes consistent location:  Back of head, throbbing pain    Corrective Eyewear:  Yes, driving  Wearing today? Yes  TEST RESULTS COMMENTS   CERVICAL EXAM    WNL WNL Prev Visit Imp NT    Cervical AROM []  []  [x]  []  Flxn: 50 w/ pulling; ext 25 w/ strain base of head; SB Rt 25, Lt 23 w/ strain on the contralateral side; Rot'n Lt 40 deg w/ strain on the contralateral side, Rt 45 w/ pain on the Lt side.   Cervical Isometrics []  [x]  []  []     Palpation []  []  []  []   Sharps Purser []  [x]  []  []     Alar Ligament []  [x]  []  []     Cervical Kinesthes. []  []  []  [x]      []  []  []  []      []  []  []  []       SIGHT AND FUNCTIONAL VISION    WNL WN PrevVisit Imp NT Not tested due to cervical treatment   Smooth Pursuits  Saccadic? []  []  []  [x]     EOM / ROM []  []  []  [x]     Saccades  H  Hypermetric  hypometric []  []  []  [x]     Saccades  V  Hypermetric  Hypometric []  []  []  [x]     Acuity B []  []  []  [x]     Acuity R []  []  []  [x]     Acuity L    []  []  []  [x]     NPC  Insufficiency? []  []  []  [x]     Accomm  R []  []  []  [x]     Accomm  L []  []  []  [x]     Cover / Uncover  Phorias?  Eso / Exo?      []  []  []  [x]      []  []  []  []        []  []  []  []       VISUAL / VESTIBULAR    WNL WNL Prev Visit Imp NT Not tested due to cervical treatment   VOR - H []  []  []  [x]     VOR - V []  []  []  [x]     VOR-C []  []  []  [x]     VOG []  []  []  [x]      []  []  []  []      []  []  []  []        BALANCE    WNL WNL Prev Visit Imp NT Not   tested due to cervical  treatment   MCTSIB - 1 []  []  []  [x]     MCTSIB - 2  []  []  []  [x]     MCTSIB - 3 []  []  []  [x]     MCTSIB - 4 []  []  []  [x]     Tandem Gait []  []  []  [x]     Tandem w/ Cog []  []  []  [x]     Gait - H turns []  []  []  [x]     Gait - V turns []  []  []  [x]     Neurocom SOT []  []  []  [x]      []  []  []  []         PHYSIOLOGICAL PERFORMANCE   Supine  Positional Testing Reveals:  Not tested today.   Standing x1'     Standing x2'     Modified Balke TT WNL  []       Impaired  []     NT  []         Posture: forward head, rounded shoulder.  Unable to get in supine position w/o 3 pillows due to increased HAs.    Treatment Today:  Re-assessment of objective impairments noted last visit performed - see above for details.    Interventions Performed:  Review of day and provided modification strategies to promote successful participation in non-physical daily activities.  Manual therapy to cervical spine:  STM/DTM to bil cervical erector spiane, bil UTs; passive stretch of pectorals in sitting and supine; occipital release for decreasing HAs; MFR to neck to decrease HA and neck pain to allow pt to be more functional.  Modalities for pain relief:  IFC to mid cervical, upper thoracic erector spinae w/ MH 10' post tx.  Therapeutic exercise: practiced stretching exercises, scapular retraction.  Therapeutic  activity: practiced sitting, supine posture.  Pt to lie flat on her back w/ pillow supporting head and knees and practice relaxing into the bed/pillow.    Patient Education:  Home Exercise Program issued Cervical Stretches - handout issued  See TA.    ASSESSMENT:  Lindsay Elliott is a 70 y.o. female presenting to clinic today with resolving symptoms of concussion. Pt able to assume supine position w/ one pillow after seated manual therapy/therex.  Post supine manual therapy pt had no HAs or neck pain.    Recommend return to work stage: [employer].     Patient's OBJECTIVE findings today most closely align with the following clinical trajectory pattern:  1:  Cervical:  continue w/ manual therapy, therex and modalities for cervical impairement.    Functional Limitations include:  Patient is unable to participate in a full work day at this time secondary to patient comfort and / or safety.  Patient is unable to participate in any recreational or scholastic sports activities at this time.    Disabilities:  Patient will be unable to fulfill work duties at prior level without recovery from injury.  Patient is unable to resume safe participation in recreational or scholastic sports activities until recovery from injury.    Progress toward Functional Goals:  Short Term Goals:    1.  The patient will be educated in proper rest and nutrition strategies to assist in recovery process.  2.  The patient will be educated on the pathophysiology of concussion along with the rest and nutrition recommendations to promote compliance and recovery.    Long Term Goals:    1.  The patient will report no subjective symptoms and be able to participate in a full academic and/or work day without symptoms indicating appropriate progress toward recovery.    2.  The patient will be free of objective symptoms indicating a return to prior level of function.   3.  The patient will be taken through the return to play (RTP) protocol successfully indicating recovery from injury and return to prior level of function.     STG Progress Toward Goals   1 Met    2 Met      LTG's Progress Toward Goals   1 Progressing   2 Progressing   3 Not Addressed Today     PLAN:  PROGRESS TOWARD DISCHARGE CRITERIA   Symptom free at rest, or at previous subjective symptom baseline. No   Symptom free with cognitive exertion.   No  Patient has not successfully completed full day of non-physical work duties without increase in symptoms.   Successfully completed at least 24 hours on the GREEN stage at school or work. No   Normalized objective findings. No  See above objective testing notes for details.   Completed the RTP-3  readiness checklist (see scanned checklist in chart) and is ready to advance to RTP-3. n/a     Patient has not successfully completed all required steps above for discharge from physical therapy.    Patient does require continued skilled physical therapy intervention to progress toward above stated goals.        Recommendations:   Continue with established plan of care.    NEXT VISIT:  Re-assess cervical, manual therapy, therex, modalities issue HEP as indicated.    Therapist Signature:    Rocky Link, PT, MPT [phone]  Outpatient Speciality Rehab  Physical Medicine and Rehabilitation  [facility]  P: [phone]     03/03/2016

## 2016-03-08 ENCOUNTER — Ambulatory Visit: Payer: BLUE CROSS/BLUE SHIELD

## 2016-03-08 DIAGNOSIS — G4486 Cervicogenic headache: Secondary | ICD-10-CM

## 2016-03-08 DIAGNOSIS — M542 Cervicalgia: Secondary | ICD-10-CM

## 2016-03-08 DIAGNOSIS — S060X1D Concussion with loss of consciousness of 30 minutes or less, subsequent encounter: Secondary | ICD-10-CM

## 2016-03-08 NOTE — Progress Notes (Signed)
[facility]  [address] [place] [address]  Phone: [phone]      Fax: [phone]    PHYSICAL THERAPY DAILY NOTE            REFERRED BY: Lindsay Moynahan, MD    PATIENT: Lindsay Elliott DOB: [birth year]   MR #: [id number]  AGE: 70 y.o.    FACILITY PROVIDER #: [phone] PRIMARY MD: Lindsay Hams, MD      Date of Service PT Received On: 03/08/16   Treatment Time Start Time: 0810 to Stop Time: 0912   Time Calculation Time Calculation (min): 62 min   Visit # PT Visit  PT Visit Number: 4/25   Units Billed   Therapeutic Interventions  $ PT Electrical Stimulation- Unattended [phone]): 1 Procedure  $ PT Manual Therapy (97140): 3 Unit     Medications:  Lindsay Elliott has a current medication list which includes the following prescription(s): acetaminophen, levofloxacin, and prednisone.  Patient reports no changes to medication.    Certification dates, start of care, date of injury, treatment diagnosis, prognostic risk factors, and Impact testing copied from last visit:  CERTIFICATION DATES:02/23/2016- 05-24-16  Start of Care: 02/23/2016  Follow up with Medical Provider by10-26-17if not discharged before date.  Date of Injury: August 92017    HISTORY OF ASTHMA? no    RISK FACTORS THAT MAY PREDISPOSE PATIENT TO PROLONGED RECOVERY:  None.    IMPACT TEST ON FILE? No Baseline testing performed.    Treatment Diagnosis:   Concussion with LOC <30 minutes S06.0X1.D  Cervicalgia (neck pain) M54.2  Post Traumatic Headache G44.3    SUBJECTIVE:   Patient reports that her headaches are getting much better--they are not waking her up at night any more.   Pt takes pain medication about 3x/day.  Pt still can't sleep through the night but not due to HA.    PCSS / Pain Assessment:  Symptom 03/08/2016   Headache 3   Nausea    Vomiting    Balance problems 2 (initially getting up, walking)   Dizziness    Lightheadedness    Fatigue 3   Trouble falling asleep     Sleeping more than usual    Sleeping less than usual 3 (not w/ HA)   Drowsiness    Sensitivity to light    Sensitivity to noise    Irritability 2   Sadness    Nervous / Anxious    Feeling more emotional    Numbness or tingling    Feeling slowed down 2   Difficulty concentrating 3   Difficulty remembering 3   Visual problems    Other    Total Score: 21   PCSS is improving since last visit.    Patient's SUBJECTIVE symptom report most closely aligns with the following symptom trajectory pattern:  PATIENT PRIOIRTY 1 PHYSICAL Headaches Cervical presentation constant consistent location:  Back of head, top of neck; wakes up w/ pain takes meds, pain comes on again around 4 p.m--takes meds.    Corrective Eyewear:  Yes, driving  Wearing today? Yes  TEST RESULTS COMMENTS   CERVICAL EXAM    WNL WNL Prev Visit Imp NT    Cervical AROM []  []  [x]  []  Flxn: w/ pulling; ext w/ strain at base of head; SB and rot'n reduced by at least >50% w/ pain at base of head.   Cervical Isometrics []  [x]  []  []     Palpation []  []  [x]  []   Sharps Purser []  []  []  [x]     Alar Ligament []  []  []  [x]     Cervical Kinesthes. []  []  []  [x]     PROM []  []  [x]  []  Full flxn; >75% rot'n; 50% SB; 50% ext.    []  []  []  []       SIGHT AND FUNCTIONAL VISION    WNL WN PrevVisit Imp NT Assess next session   Smooth Pursuits  Saccadic? []  []  []  [x]     EOM / ROM []  []  []  [x]     Saccades  H  Hypermetric  hypometric []  []  []  [x]     Saccades  V  Hypermetric  Hypometric []  []  []  [x]     Acuity B []  []  []  [x]     Acuity R []  []  []  [x]     Acuity L    []  []  []  [x]     NPC  Insufficiency? []  []  []  [x]     Accomm  R []  []  []  [x]     Accomm  L []  []  []  [x]     Cover / Uncover  Phorias?  Eso / Exo?      []  []  []  [x]      []  []  []  []        []  []  []  []       VISUAL / VESTIBULAR    WNL WNL Prev Visit Imp NT    VOR - H []  []  []  [x]  Assess next visit   VOR - V []  []  []  [x]  "   VOR-C []  []  []  [x]  "   VOG []  []  []  [x]  "    []  []  []  []      []  []  []  []        BALANCE    WNL WNL Prev Visit Imp NT  Only cervical focus.   MCTSIB - 1 []  []  []  [x]  Will assess next session   MCTSIB - 2  []  []  []  [x]  "   MCTSIB - 3 []  []  []  [x]  "   MCTSIB - 4 []  []  []  [x]  "   Tandem Gait []  []  []  [x]  "   Tandem w/ Cog []  []  []  [x]    "   Gait - H turns []  []  []  [x]  "   Gait - V turns []  []  []  [x]  "   Neurocom SOT []  []  []  [x]  "    []  []  []  []     During balance testing, shoes were Not applicable with testing today.Marland Kitchen    PHYSIOLOGICAL PERFORMANCE   Supine  Positional Testing Reveals:  Not tested today.   Standing x1'     Standing x2'     Modified Balke TT WNL  []       Impaired  []     NT  [x]         Treatment Today:  Re-assessment of objective impairments noted last visit performed - see above for details.    Interventions Performed:  Review of daily nutrition log and educated on strategies to promote recovery.  Manual therapy: STM/DTM to bil cervical erector spiane, bil UTs; passive stretch of pectorals in supine; occipital and suboccipital release for decreasing HAs; MFR to neck to decrease HA and neck pain to allow pt to be more functional.  P/a glide grade I of C-spine to increase mobility; CRC for increasing upper cervical rot'n.  Modalities for pain relief:  IFC to mid cervical, upper thoracic erector spinae w/ MH 10' post tx.    Therapeutic exercise: practiced HEP routine.  End range resistance and  resistance throughout supine rot'n to maintain gained rot'n range.  Seated rot'n and wt shifting to same side to increase cervical rot'n--each side.    Patient Education:  Home Exercise Program issued Cervical Stretches - handout issued  Nutritional recommendations to support recovery - good hydration, plenty of fruits/vegetables, good quality protein snacks every 3-4 hours.  Recommendations for work stage: YELLOW:  Active Recovery Protocol.  Continue with previous modifications.  Importance of avoiding head threatening activities to prevent further injury.    ASSESSMENT:  Lindsay Elliott is a 70 y.o. female presenting to clinic  today with resolving symptoms of concussion.  Today's tx focus on cervical spine and HA issues.  Pt w/  Increased cervial rot'n, SB and flxn post manual therapy.  Pt will benefit from twice weekly treatments (orthopedic focused) tx to her neck for the next few tx session along w/ vestibular PT follow up to address the other systems.    Recommend return to work stage: YELLOW.     Patient's OBJECTIVE findings today most closely align with the following clinical trajectory pattern:  Cervical:  Only addressed this as pt is coming in to see Vestibular therapist tomorrow where sight and functional vision, visual/vestibular and balance will be assessed.    Functional Limitations include:  Patient is able to participate in full work day, however with symptom report and need for modifications.  Patient is unable to participate in any recreational or scholastic sports activities at this time.    Disabilities:  Patient will be unable to fulfill work duties at prior level without recovery from injury.  Patient is unable to resume safe participation in recreational or scholastic sports activities until recovery from injury.     Progress toward Functional Goals:  Short Term Goals:    1.  The patient will be educated in proper rest and nutrition strategies to assist in recovery process.  2.  The patient will be educated on the pathophysiology of concussion along with the rest and nutrition recommendations to promote compliance and recovery.    Long Term Goals:    1.  The patient will report no subjective symptoms and be able to participate in a full academic and/or work day without symptoms indicating appropriate progress toward recovery.    2.  The patient will be free of objective symptoms indicating a return to prior level of function.   3.  The patient will be taken through the return to play (RTP) protocol successfully indicating recovery from injury and return to prior level of function.     STG Progress Toward Goals   1 Met     2 Met      LTG's Progress Toward Goals   1 Progressing   2 Progressing   3 Not Addressed Today     PLAN:  PROGRESS TOWARD DISCHARGE CRITERIA   Symptom free at rest, or at previous subjective symptom baseline. No   Symptom free with cognitive exertion.   No  Patient has not successfully completed full day of non-physical work duties without increase in symptoms.   Successfully completed at least 24 hours on the GREEN stage at school or work. No   Normalized objective findings. No  See above objective testing notes for details.   Completed the RTP-3 readiness checklist (see scanned checklist in chart) and is ready to advance to RTP-3. N/A--pt is not a RTP3 candidate     Patient has not successfully completed all required steps above for discharge from physical therapy.  Patient does require continued skilled physical therapy intervention to progress toward above stated goals.        Recommendations:   Continue with established plan of care.  VESTIBULAR PT EVAL may be indicated if findings persist.  Pt will benefit from 2/wk txs to address the cervical impairments.    NEXT VISIT:  Re-assess ocular motor, visual/vestibular and balance, issue HEP as indicated.  Neurocom Transport planner Testing  Positional BP testing to screen for physiological dysfunction due to symptoms of fogginess, lightheadedness, headache.  Follow up with Vestibular PT for consult.    Therapist Signature:    Rocky Link, PT, MPT [phone]  Outpatient Speciality Rehab  Physical Medicine and Rehabilitation  [facility]  P: [phone]     03/08/2016

## 2016-03-10 ENCOUNTER — Ambulatory Visit: Payer: BLUE CROSS/BLUE SHIELD

## 2016-03-10 DIAGNOSIS — S060X1D Concussion with loss of consciousness of 30 minutes or less, subsequent encounter: Secondary | ICD-10-CM

## 2016-03-10 NOTE — Progress Notes (Signed)
[facility]  [address] [place] [address]  Phone: [phone]      Fax: [phone]    PHYSICAL THERAPY DAILY NOTE - CONCUSSION MANAGEMENT            REFERRED BY: [name], MD    PATIENT: Lindsay Elliott DOB: January 12, 1946   MR #: [id number]  AGE: 70 y.o.    FACILITY PROVIDER #: [phone] PRIMARY MD: Gregor Hams, MD      Date of Service PT Received On: 03/10/16   Treatment Time Start Time: 1005 to Stop Time: 1100   Time Calculation Time Calculation (min): 55 min   Visit # PT Visit  PT Visit Number: 5/25   Units Billed   Therapeutic Interventions  $ PT Neuromuscular Re-education [phone]): 3 Units  $ PT Therapeutic Activity (97530): 1 unit     [name] referred for physical therapy services by: [name], MD    Certification period, precautions, medications, allergies and baseline testing status copied from initial evaluation - reviewed and reconciled today.  Sharol Given, PT 03/10/2016  Patient reports no changes to medication.    CERTIFICATION DATES:02/23/2016- 05-24-16  Start of Care: 02/23/2016  Follow up with Medical Provider by10-26-17if not discharged before date.  Date of Injury: August 92017    HISTORY OF ASTHMA? no    RISK FACTORS THAT MAY PREDISPOSE PATIENT TO PROLONGED RECOVERY:  None.    IMPACT TEST ON FILE? No Baseline testing performed.    Treatment Diagnosis:   Concussion with LOC <30 minutes S06.0X1.D  Cervicalgia (neck pain) M54.2  Post Traumatic Headache G44.3    Short Term Goals:    1.  The patient will be educated in proper rest and nutrition strategies to assist in recovery process.  2.  The patient will be educated on the pathophysiology of concussion along with the rest and nutrition recommendations to promote compliance and recovery.    Long Term Goals:    1.  The patient will report no subjective symptoms and be able to participate in a full academic and/or work day without symptoms  indicating appropriate progress toward recovery.    2.  The patient will be free of objective symptoms indicating a return to prior level of function.   3.  The patient will be taken through the return to play (RTP) protocol successfully indicating recovery from injury and return to prior level of function.   *Working Toward the Above Goals: (copied from last visit)*    GOAL# Progress Toward Goals   1 Met    2 Met    1 Progressing   2 Progressing   3 Progressing     EXAMINATION:  Subjective:   Patient reports much improvement in headaches - no longer waking her up at night.  Patient reports she has switched from tylenol to motrin in past week, but is still taking 3 times a day every day.    PCSS / Pain Assessment:  Symptom 03/10/2016   Headache 3 - no longer waking her up at night   Nausea 0   Vomiting 0   Balance problems 2 - still with quick getting up, stands for a minute to get her balance   Dizziness 0   Lightheadedness 0   Fatigue 2   Trouble falling asleep 0   Sleeping more than usual 0   Sleeping less than usual 2 - waking up about 2-3 times a night, twice  gets up and goes to bathroom.  Before injury slept through night.    Can go right back to sleep.   Drowsiness 0   Sensitivity to light 0   Sensitivity to noise 0   Irritability 2   Sadness 0   Nervous / Anxious 0   Feeling more emotional 0   Numbness or tingling 0   Feeling slowed down 2   Difficulty concentrating 3 - learning a new job, thinks she figured something out and then 2 minutes later can't figure out how to do it.   Difficulty remembering 3 - can't remember a phone number she's dialed for years.   Visual problems 0   Other 0   Total Score: 19   PCSS is improving since last visit.    Patient's SUBJECTIVE symptom report most closely aligns with the following symptom trajectory pattern:  PATIENT PRIOIRTY 1 EMOTIONAL frustrated and overwhelmed with slow progress, and having to make several appointments for lawyers and medical, more  irritable.    Clinical Findings:  Corrective Eyewear:  Yes, for driving  Wearing today? Yes  TEST RESULTS COMMENTS   CERVICAL EXAM    WNL WNL Prev Visit Imp NT    Cervical AROM []  []  []  [x]     Cervical Isometrics []  []  []  [x]     Palpation []  []  []  [x]     Sharps Purser []  []  []  [x]     Alar Ligament []  []  []  [x]     Cervical Kinesthes. []  []  []  [x]      []  []  []  []      []  []  []  []       SIGHT AND FUNCTIONAL VISION    WNL WN PrevVisit Imp NT    Smooth Pursuits  Saccadic? []  []  [x]  []  Saccadic intrusions noted in horizontal planes midline and superior   EOM / ROM [x]  []  []  []  Eye strain noted, no pain   Saccades  H  Hypermetric  hypometric []  []  [x]  []  Hypometric - no symptoms   Saccades  V  Hypermetric  Hypometric []  []  [x]  []  Hypometric - no symptoms   Acuity B []  []  [x]  []  Seated at 10 feet with glasses, 10/12   Acuity R []  []  [x]  []  10/12   Acuity L    []  []  [x]  []  10/12   NPC  Insufficiency? []  []  [x]  []  Does not see double, however left eye exotropia at 3"   Accomm  R [x]  []  []  []  No glasses - 5"   Accomm  L [x]  []  []  []  No glasses - 5"   Cover / Uncover  Phorias?  Eso / Exo?      []  []  []  [x]      []  []  []  []        []  []  []  []       VISUAL / VESTIBULAR    WNL WNL Prev Visit Imp NT    VOR - H []  []  [x]  []  Retinal slip noted, no symptoms.  HEP issued.   VOR - V [x]  []  []  []  Difficulty with motor planning, however intact   VOR-C []  []  []  [x]     VOG [x]  []  []  []  No spontaneous or gaze evoked nystagmus noted.    []  []  []  []      []  []  []  []        BALANCE    WNL WNL Prev Visit Imp NT    MCTSIB - 1 [x]  []  []  []     MCTSIB - 2  []  []  [  x] []     MCTSIB - 3 []  []  [x]  []     MCTSIB - 4 []  []  [x]  []  Perfect protocol issued today   Tandem Gait []  []  []  [x]     Tandem w/ Cog []  []  []  [x]     Gait - H turns []  []  [x]  []  Path deviation   Gait - V turns []  []  [x]  []  Mild path deviation and c/o dizziness.   Neurocom SOT []  []  []  [x]      []  []  []  []       PHYSIOLOGICAL PERFORMANCE   Supine  Positional Testing Reveals:  Not tested today.    Standing x1'     Standing x2'     Modified Balke TT WNL  []       Impaired  []     NT  [x]         IMPACT TESTING    WNL WNL Prev Visit Imp NT Impact Test administered to assess for symptom provocation with cognitive exertion.   ImPact Post- Injury Test []  []  []  [x]       INTERVENTION:   Treatment Today:  Re-assessment of objective impairments noted last visit performed - see above for details.    Interventions Performed:  Patient educated in HEP for balance and VOR training - handouts issued for VOR (starting in seated position) and Perfect Protocol.    Educated on weaning off of motrin to prevent rebound headaches.  Advised to follow up with MD if she is not able to successfully wean off regular motrin use and resume intermittent use over next week.  Discussed weaning off strategy - from 2 every 6 hours to 1 every 6 hours, then from 3/day to 2/day to 1/day and then to as needed.    Patient Education:  Patient asks "if I'm not better by 3 months does that mean that's as good as I'm going to get?"  Patient educated on neural plasticity and vestibular / ocular dysfunction and rehabilitation options for both.  Educated patient on the fact that the answer to her question is no, and that every patient, every injury is distinct and individual.  And as long as we see opportunity to rehab we will continue to rehab, even after the 3 months time mark have come and gone.    EVALUATION AND DIAGNOSIS:   Steele Ledonne is a 70 y.o. female presenting to clinic today with resolving symptoms of concussion. While Yehudis is improving greatly, she continues to present primarily with cervical and vestibular dysfunction.    Recommend return to work stage: YELLOW., Recommend RTP STAGE: 1 - Daily leisurely walks.     Patient's OBJECTIVE findings today most closely align with the following clinical trajectory pattern:  1: Cervical:  Consider orthopedic PT consult.  2: Vestibular: Follow up with vestibular PT.   3.  Ocular:  Monitor for  recovery and intervene as indicated.    Functional Limitations include:  Patient is able to participate in full work day, however with symptom report and need for modifications.  Patient is unable to resume full time work schedule and continues to work part time due to symptoms.    Disabilities:  Patient will be unable to fulfill work duties at prior level without recovery from injury.  Patient is at risk for permanent symptom complex if not fully recovered in a timely manner.    PLAN:  PROGRESS TOWARD DISCHARGE CRITERIA   Symptom free at rest, or at previous subjective symptom baseline. No   Symptom  free with cognitive exertion.   No  Patient has not successfully completed full day of non-physical work duties without increase in symptoms.   Successfully completed at least 24 hours on the GREEN stage at school or work. No   Normalized objective findings. No  See above objective testing notes for details.   Completed the RTP-3 readiness checklist (see scanned checklist in chart) and is ready to advance to RTP-3. No, patient is not ready to advance to RTP-3.     Patient has not successfully completed all required steps above for discharge from physical therapy.    Patient does require continued skilled physical therapy intervention to progress toward above stated goals.        Recommendations:   Continue with established plan of care.    NEXT VISIT:  Consider Ortho PT 2/week for cervical complaints.  Consider Vestibular PT POC 1/week given vestibular dysfunction.  Continue manual therapy / modalities to treat cervical complaints.  Continue vestibular rehab.    Therapist Signature:    Adela Ports,   #[id number]  Physical Therapist      03/10/2016

## 2016-03-11 ENCOUNTER — Ambulatory Visit: Payer: BLUE CROSS/BLUE SHIELD

## 2016-03-11 DIAGNOSIS — G4486 Cervicogenic headache: Secondary | ICD-10-CM

## 2016-03-11 DIAGNOSIS — R29898 Other symptoms and signs involving the musculoskeletal system: Secondary | ICD-10-CM | POA: Insufficient documentation

## 2016-03-11 DIAGNOSIS — M542 Cervicalgia: Secondary | ICD-10-CM

## 2016-03-11 NOTE — Progress Notes (Signed)
[facility]  [address] [place] [address]  Phone: [phone]      Fax: [phone]    PHYSICAL THERAPY DAILY NOTE            REFERRED BY: [name], MD    PATIENT: Lindsay Elliott DOB: 07-16-1945   MR #: [id number]  AGE: 70 y.o.    FACILITY PROVIDER #: [phone] PRIMARY MD: Gregor Hams, MD      Date of Service PT Received On: 03/11/16   Treatment Time Start Time: 0807 to Stop Time: 0910   Time Calculation Time Calculation (min): 63 min   Visit # PT Visit  PT Visit Number: 6/25   Units Billed   Therapeutic Interventions  $ PT Electrical Stimulation- Unattended [phone]): 1 Procedure  $ PT Therapeutic Exercise (97110): 1 Unit  $ PT Manual Therapy (32440): 2 Unit     Medications:  Alawna Graybeal has a current medication list which includes the following prescription(s): acetaminophen, levofloxacin, and prednisone.  Patient reports no changes to medication. 03/11/2016      Certification dates, start of care, date of injury, treatment diagnosis, prognostic risk factors, and Impact testing copied from last visit:  CERTIFICATION DATES:02/23/2016- 05-24-16  Start of Care: 02/23/2016  Follow up with Medical Provider by10-26-17if not discharged before date.  Date of Injury: August 92017    HISTORY OF ASTHMA? no    RISK FACTORS THAT MAY PREDISPOSE PATIENT TO PROLONGED RECOVERY:  None.    IMPACT TEST ON FILE? No Baseline testing performed.    Treatment Diagnosis:   Concussion with LOC <30 minutes S06.0X1.D  Cervicalgia (neck pain) M54.2  Post Traumatic Headache G44.3    SUBJECTIVE:   Pt still wakes up back of head pain but not waking up w/ neck stiffness and HA at night.      PCSS / Pain Assessment:  No change since yesterday.  HA 3/10, neck pain 4/10 pre treatment; post treatment HA 2-3/10, neck pain 3/10.    Patient's SUBJECTIVE symptom report most closely aligns with the following symptom trajectory pattern:  PATIENT PRIOIRTY 1  PHYSICAL Headaches Cervical presentation constant consistent location:  Back of head, top of neck; wakes up w/ pain takes meds, pain comes on again around 4 p.m--takes meds.    Corrective Eyewear:  Yes, driving  Wearing today? Yes  TEST RESULTS COMMENTS   CERVICAL EXAM    WNL WNL Prev Visit Imp NT    Cervical AROM []  []  [x]  []  Flxn: w/ pulling; ext w/ strain at base of head; SB reduced by>70%, Rt rot'n grossly 50% and Lt rot'n grossly 25% reduced w/ pain at base of head.   Cervical Isometrics []  [x]  []  []     Palpation []  []  [x]  []  +TTP lateral occiput; mod tightness in bil occiput and upper cervical erector spinae, min tightness in mid to lower cervical erector spinae.   Sharps Purser []  []  []  [x]     Alar Ligament []  []  []  [x]     Cervical Kinesthes. []  []  []  [x]     PROM []  []  [x]  []  Full flxn; >75% rot'n;     []  []  []  []         Treatment Today:  Re-assessment of objective impairments noted last visit performed - see above for details.    Interventions Performed:  Review of daily nutrition log and educated on strategies to promote recovery.  Manual therapy: to increase ROM, decrease pain, increase tissue extensibility  to allow pt to have functional ROM.  passive stretch of pectorals in supine; MFR to upper cervical erector spinae to decrease guarding, decrease tissue tightness to increase upper cervical ROM; STM/DTM to bil cervical erector spiane; occipital and suboccipital release for decreasing guarding, HAs;  P/a glide grade I of C-spine to increase mobility; PROM--rot'n to end range w/ contralateral stretch of UT (by passively depressing shoulder/scap complex) and utilizing ipsilateral eye movement to contract upper cervical muscle to further increase ROM; PROM flxn/ext; SBRt/Lt w/ c/o dizziness (room spinning) 4/10.  Modalities for pain relief:  IFC to mid cervical, upper thoracic erector spinae w/ MH 10' post tx.    Therapeutic exercise: UBE: fwd/retro 2'/2'--pt reported dizziness 4/10 after she got up.  Rows:  YTB x10, horiz abd YTB x10  Reviewed HEP routine.  End range resistance and resistance throughout supine rot'n to maintain gained rot'n range.  Seated rot'n and wt shifting to same side to increase cervical rot'n--each side.    Patient Education:  Home Exercise Program issued Cervical Stretches - handout issued  Nutritional recommendations to support recovery - good hydration, plenty of fruits/vegetables, good quality protein snacks every 3-4 hours.  Recommendations for work stage: YELLOW:  Active Recovery Protocol.  Continue with previous modifications.  Importance of avoiding head threatening activities to prevent further injury.  Pt to consider using topical cream for pain relief (OTC) for her neck.    ASSESSMENT:  [name] is a 70 y.o. female presenting to clinic today with resolving symptoms of concussion.   Good challenge w/ therex w/ c/o dizziness post UBE.  Pt w/  Increased cervial rot'n, SB and flxn post manual therapy.  Pt's existing oculomotor impairments and vestibular impairments may be causing the neck muscles to tighten up--in response the dizziness and eye strain.  As pt is till waking up w/ neck and cervicogenic HA in a.m. pt will benefit from twice/week PT for her neck.  Pt's progress in PT may be affected by her current vestibular and oculomotor impairments.    Recommend return to work stage: YELLOW.     Functional Limitations include:  Patient is able to participate in full work day, however with symptom report and need for modifications.  Patient is unable to participate in any recreational physical activities at this time.    Disabilities:  Patient will be unable to fulfill work duties at prior level without recovery from injury.  Patient is unable to resume safe participation in recreational physcial activities until recovery from injury.     Progress toward Functional Goals:  Short Term Goals:    1.  The patient will be educated in proper rest and nutrition strategies to assist in  recovery process.  2.  The patient will be educated on the pathophysiology of concussion along with the rest and nutrition recommendations to promote compliance and recovery.    Long Term Goals:    1.  The patient will report no subjective symptoms and be able to participate in a full academic and/or work day without symptoms indicating appropriate progress toward recovery.    2.  The patient will be free of objective symptoms indicating a return to prior level of function.   3.  The patient will be taken through the return to play (RTP) protocol successfully indicating recovery from injury and return to prior level of function.     STG Progress Toward Goals   1 Met    2 Met      LTG's  Progress Toward Goals   1 Progressing   2 Progressing   3 Not Addressed Today     PLAN:    Patient does require continued skilled physical therapy intervention to progress toward above stated goals.        Recommendations:   Continue with established plan of care.  Orthopedic physical therapy treatments twice weekly.    NEXT VISIT:  Continue w/ current plan of care.  Schedule pt for orthopedic treatments for her neck twice weekly, get script from MD.    Therapist Signature:    Rocky Link, PT, MPT [phone]  Outpatient Speciality Rehab  Physical Medicine and Rehabilitation  [facility]  P: [phone]     03/11/2016

## 2016-03-15 ENCOUNTER — Ambulatory Visit: Payer: BLUE CROSS/BLUE SHIELD

## 2016-03-15 DIAGNOSIS — G4486 Cervicogenic headache: Secondary | ICD-10-CM

## 2016-03-15 DIAGNOSIS — M542 Cervicalgia: Secondary | ICD-10-CM

## 2016-03-15 DIAGNOSIS — R29898 Other symptoms and signs involving the musculoskeletal system: Secondary | ICD-10-CM

## 2016-03-15 NOTE — Progress Notes (Signed)
[facility]  [address] [place] [address]  Phone: [phone]      Fax: [phone]    PHYSICAL THERAPY DAILY NOTE            REFERRED BY: [name], MD    PATIENT: Lindsay Elliott DOB: [birth year]   MR #: [id number]  AGE: 70 y.o.    FACILITY PROVIDER #: [phone] PRIMARY MD: Gregor Hams, MD      Date of Service PT Received On: 03/15/16   Treatment Time Start Time: 0807 to Stop Time: 0905   Time Calculation Time Calculation (min): 58 min   Visit # PT Visit  PT Visit Number: 7/25   Units Billed   Therapeutic Interventions  $ PT Ultrasound (52841): 1 Units  $ PT Therapeutic Exercise (97110): 1 Unit  $ PT Manual Therapy (97140): 2 Unit     Medications:  Sudiksha Victor has a current medication list which includes the following prescription(s): acetaminophen, levofloxacin, and prednisone.  Patient reports no changes to medication. 03/15/2016      Certification dates, start of care, date of injury, treatment diagnosis, prognostic risk factors, and Impact testing copied from last visit:  CERTIFICATION DATES:02/23/2016- 05-24-16  Start of Care: 02/23/2016  Follow up with Medical Provider by10-26-17if not discharged before date.  Date of Injury: August 92017    HISTORY OF ASTHMA? no    RISK FACTORS THAT MAY PREDISPOSE PATIENT TO PROLONGED RECOVERY:  None.    IMPACT TEST ON FILE? No Baseline testing performed.    Treatment Diagnosis:   Concussion with LOC <30 minutes S06.0X1.D  Cervicalgia (neck pain) M54.2  Post Traumatic Headache G44.3    SUBJECTIVE:   Pt reports that her neck pain and base of head "ache" is getting worse since she started doing the vestibular exercises.  Pt is not doing any flexion stretch afterwards or applies any topical pain relief meds.    PCSS / Pain Assessment:  Symptom 03/10/2016   Headache 4 - base of head   Nausea 0   Vomiting 0   Balance problems 3 - still with quick getting up, stands for a minute to get  her balance   Dizziness 2   Lightheadedness 0   Fatigue 4   Trouble falling asleep 0   Sleeping more than usual 0   Sleeping less than usual 3- waking up about 2-3 times a night    Drowsiness 0   Sensitivity to light 0   Sensitivity to noise 0   Irritability 3   Sadness 0   Nervous / Anxious 0   Feeling more emotional 0   Numbness or tingling 0   Feeling slowed down 2   Difficulty concentrating 3    Difficulty remembering 3    Visual problems 0   Other 0   Total Score: 27         Patient's SUBJECTIVE symptom report most closely aligns with the following symptom trajectory pattern:  PATIENT PRIOIRTY 1 PHYSICAL Headaches Cervical presentation constant consistent location:  Back of head, top of neck; wakes up w/ pain takes meds, pain comes on again around 4 p.m--takes meds.  It has gotten worse since pt started doing the vestibular exercises.    Corrective Eyewear:  Yes, driving  Wearing today? Yes  TEST RESULTS COMMENTS   CERVICAL EXAM    WNL WNL Prev Visit Imp NT    Cervical AROM []  []  [x]  []  Flxn: w/  pulling; ext w/ strain at base of head; Rt rot'n grossly 50% and Lt rot'n grossly 25% reduced w/ pain at base of head.   Cervical Isometrics []  [x]  []  []     Palpation []  []  [x]  []  +TTP lateral occiput; mod tightness in bil occiput; min tightness w/ upper cervical erector spinae,  mid and lower cervical erector spinae.   Sharps Purser []  []  []  [x]     Alar Ligament []  []  []  [x]     Cervical Kinesthes. []  []  []  [x]     PROM []  []  [x]  []  Full flxn; >75% rot'n;     []  []  []  []         Treatment Today:  Re-assessment of objective impairments noted last visit performed - see above for details.    Interventions Performed:  Review of daily nutrition log and educated on strategies to promote recovery.  Manual therapy: to increase ROM, decrease pain, increase tissue extensibility to allow pt to have functional ROM.  passive stretch of pectorals in supine; MFR to upper cervical erector spinae to decrease guarding, decrease tissue  tightness to increase upper cervical ROM; STM/DTM to bil cervical erector spiane; occipital and suboccipital release for decreasing guarding, HAs;      Modalities for pain relief: [place] to upper cervical erector spinae 1 MHz, 2 w/cm2 at 50% 10 min total to decrease ms tightness, promote healing.    Therapeutic exercise: UBE: fwd/retro 3'/2'.  Rows: YTB x12, horiz abd YTB x12, OHP #1 x10 (Lt shoulder stiffness)  Upper cervical rot'n stretch and mobs both sides.  Reviewed HEP routine    Patient Education:  Home Exercise Program reviewed and progressed Cervical Stretches - handout issued  Nutritional recommendations to support recovery - good hydration, plenty of fruits/vegetables, good quality protein snacks every 3-4 hours.  Recommendations for work stage: YELLOW:  Active Recovery Protocol.  Continue with previous modifications.  Importance of avoiding head threatening activities to prevent further injury.  Pt to consider using topical cream for pain relief (OTC) for her neck.    ASSESSMENT:  Lindsay Elliott is a 70 y.o. female presenting to clinic today with resolving symptoms of concussion.   Pt w/  Increased cervial rot'n, and flxn post manual therapy.  Pt's progress in PT may be affected by her current vestibular and oculomotor impairments.  Increased HA today and may be from not stretching post doing vestibular exercises.      Recommend return to work stage: YELLOW.     Functional Limitations include:  Patient is able to participate in full work day, however with symptom report and need for modifications.  Patient is unable to participate in any recreational physical activities at this time.    Disabilities:  Patient will be unable to fulfill work duties at prior level without recovery from injury.  Patient is unable to resume safe participation in recreational physcial activities until recovery from injury.     Progress toward Functional Goals:  Short Term Goals:    1.  The patient will be educated in proper  rest and nutrition strategies to assist in recovery process.  2.  The patient will be educated on the pathophysiology of concussion along with the rest and nutrition recommendations to promote compliance and recovery.    Long Term Goals:    1.  The patient will report no subjective symptoms and be able to participate in a full academic and/or work day without symptoms indicating appropriate progress toward recovery.    2.  The patient  will be free of objective symptoms indicating a return to prior level of function.   3.  The patient will be taken through the return to play (RTP) protocol successfully indicating recovery from injury and return to prior level of function.     STG Progress Toward Goals   1 Met    2 Met      LTG's Progress Toward Goals   1 Progressing   2 Progressing   3 Not Addressed Today     PLAN:    Patient does require continued skilled physical therapy intervention to progress toward above stated goals.        Recommendations:   Continue with established plan of care.  Orthopedic physical therapy treatments twice weekly.    NEXT VISIT:  Continue w/ current POC and start 2/wk tx session when schedule permits.  Vestibular PT to review upper cervical flxn and rot'n w/ pt as pt does chin tucks poorly.    Therapist Signature:    Rocky Link, PT, MPT [phone]  Outpatient Speciality Rehab  Physical Medicine and Rehabilitation  [facility]  P: [phone]     03/15/2016

## 2016-03-17 ENCOUNTER — Ambulatory Visit: Payer: BLUE CROSS/BLUE SHIELD

## 2016-03-17 DIAGNOSIS — R2689 Other abnormalities of gait and mobility: Secondary | ICD-10-CM

## 2016-03-17 DIAGNOSIS — S060X0D Concussion without loss of consciousness, subsequent encounter: Secondary | ICD-10-CM

## 2016-03-17 NOTE — Progress Notes (Signed)
[facility]  [address] [place] [address]  Phone: [phone]      Fax: [phone]    PHYSICAL THERAPY DAILY NOTE - CONCUSSION MANAGEMENT            REFERRED BY: Lindsay Moynahan, MD    PATIENT: Lindsay Elliott DOB: [birth year]   MR #: [id number]  AGE: 70 y.o.    FACILITY PROVIDER #: [phone] PRIMARY MD: Lindsay Hams, MD      Date of Service PT Received On: 03/17/16   Treatment Time Start Time: 1110 to Stop Time: 1200   Time Calculation Time Calculation (min): 50 min   Visit # PT Visit  PT Visit Number: 8/25   Units Billed   Therapeutic Interventions  $ PT Neuromuscular Re-education [phone]): 3 Units     Lindsay Elliott referred for physical therapy services by: Lindsay Moynahan, MD    Certification period, precautions, medications, allergies and baseline testing status copied from initial evaluation - reviewed and reconciled today.  Lindsay Elliott, PT 03/17/2016  Patient reports no changes to medication.    CERTIFICATION DATES:02/23/2016- 05-24-16  Start of Care: 02/23/2016  Follow up with Medical Provider by10-26-17if not discharged before date.  Date of Injury: August 92017    HISTORY OF ASTHMA? no    RISK FACTORS THAT MAY PREDISPOSE PATIENT TO PROLONGED RECOVERY:  None.    IMPACT TEST ON FILE? No Baseline testing performed.    Treatment Diagnosis:   Concussion with LOC <30 minutes S06.0X1.D  Cervicalgia (neck pain) M54.2  Post Traumatic Headache G44.3    Short Term Goals:  1. The patient will be educated in proper rest and nutrition strategies to assist in recovery process.  2. The patient will be educated on the pathophysiology of concussion along with the rest and nutrition recommendations to promote compliance and recovery.    Long Term Goals:  1. The patient will report no subjective symptoms and be able to participate in a full academic and/or work day without symptoms indicating appropriate progress toward  recovery.   2. The patient will be free of objective symptoms indicating a return to prior level of function.   3. The patient will be taken through the return to play (RTP) protocol successfully indicating recovery from injury and return to prior level of function.     *Working Toward the Above Goals: (copied from last visit)*    GOAL# Progress Toward Goals   1 Met    2 Met    1 Progressing   2 Progressing   3 Progressing     EXAMINATION:  Subjective:   Patient reports sleep is much better - sleeping through night and falling asleep easily.  Not feeling as slowed down.  "i'ts my fault my headaches are bad today - I haven't been drinking my protein shake."  Patient reports when she is compliant with protein protocol her symptoms are improved.    PCSS / Pain Assessment:  Symptom 03/17/2016   Headache 4   Nausea 0   Vomiting 0   Balance problems 3 - with home exercise program, and first getting up.  Also, when bending forward to pick an item up off of floor, when she gets up she is unsteady.   Dizziness 0   Lightheadedness 0   Fatigue 3   Trouble falling asleep 0   Sleeping more than usual 0   Sleeping less than usual 0   Drowsiness 0  Sensitivity to light 0   Sensitivity to noise 0   Irritability 3   Sadness 0   Nervous / Anxious 0   Feeling more emotional 0   Numbness or tingling 0   Feeling slowed down 0   Difficulty concentrating 3   Difficulty remembering 3   Visual problems 0   Other    Total Score: 19   PCSS is consistent since last visit.    Patient's SUBJECTIVE symptom report most closely aligns with the following symptom trajectory pattern:  PATIENT PRIOIRTY 1 PHYSICAL neck pain /  headaches    Clinical Findings:  Corrective Eyewear:  Yes, for driving  Wearing today? Yes          TEST RESULTS COMMENTS   CERVICAL EXAM    WNL WNL Prev Visit Imp NT    Cervical AROM []  []  []  [x]     Cervical Isometrics []  []  []  [x]     Palpation []  []  []  [x]     Sharps Purser []  []  []  [x]     Alar Ligament []  []  []  [x]      Cervical Kinesthes. []  []  []  [x]      []  []  []  []      []  []  []  []               SIGHT AND FUNCTIONAL VISION    WNL WN PrevVisit Imp NT    Smooth Pursuits  Saccadic? []  []  [x]  []  10/12:  Saccadic intrusions noted in horizontal planes midline and superior   EOM / ROM [x]  []  []  []  10/12:  Eye strain noted, no pain   Saccades  H  Hypermetric  hypometric []  []  [x]  []  10/12:  Hypometric - no symptoms   Saccades  V  Hypermetric  Hypometric []  []  [x]  []  10/12:  Hypometric - no symptoms   Acuity B []  []  [x]  []  10/12:  Seated at 10 feet with glasses, 10/12   Acuity R []  []  [x]  []  10/12:  10/12   Acuity L    []  []  [x]  []  10/12:  10/12   NPC  Insufficiency? []  []  [x]  []  10/12:  Does not see double, however left eye exotropia at 3"   Accomm  R []  [x]  []  []     Accomm  L []  [x]  []  []     Cover / Uncover  Phorias?  Eso / Exo?      []  []  []  [x]      []  []  []  []        []  []  []  []               VISUAL / VESTIBULAR    WNL WNL Prev Visit Imp NT    VOR - H []  []  [x]  []  10/12:  Retinal slip noted, no symptoms.  HEP issued.   VOR - V []  [x]  []  []     VOR-C []  []  []  [x]     VOG []  [x]  []  []      []  []  []  []      []  []  []  []                BALANCE    WNL WNL Prev Visit Imp NT    MCTSIB - 1 []  [x]  []  []     MCTSIB - 2  []  []  [x]  []     MCTSIB - 3 []  []  [x]  []     MCTSIB - 4 []  []  [x]  []     Tandem Gait []  []  []  [x]     Tandem w/ Cog []  []  []  [  x]    Gait - H turns []  []  [x]  []  10/12:  Path deviation   Gait - V turns []  []  [x]  []  10/12:  Mild path deviation and c/o dizziness.   Neurocom SOT []  []  [x]  []  10/19:  impaired contributions from visual and vestibular systems, falls in conditions 4/5/6.    []  []  []  []            PHYSIOLOGICAL PERFORMANCE - Assessed 03/17/16   Supine 142/69  P62 L UE Positional Testing Reveals:  4/10 dizziness upon standing, resolves within 10 seconds.  Same dizziness as reported with PCSS.   Standing x1' 155/60  P56  L UE    Standing x2' 142/58  P58  R  UE    Modified Balke TT Not assessed         IMPACT TESTING    WNL WNL Prev Visit Imp NT Impact Test administered to assess for symptom provocation with cognitive exertion.   ImPact Post- Injury Test []  []  []  [x]       INTERVENTION:   Treatment Today:  Re-assessment of objective impairments noted last visit performed - see above for details.    Interventions Performed:  Instructed in VOR Times 1 for HEP - seated, progress to standing.    Patient Education:  Role of the vestibular system in balance, and how it relates to impairments following concussion.    EVALUATION AND DIAGNOSIS:   Lindsay Elliott is a 70 y.o. female presenting to clinic today with persistent symptoms of concussion. [facility] presents with vestibular and cervical dysfunction and will benefit from targeted interventions weekly to address both domains.  Continue ortho (cervical) and vestibular PT weekly for maximal outcomes.    Recommend return to work stage: YELLOW.     Patient's OBJECTIVE findings today most closely align with the following clinical trajectory pattern:  1: Cervical:  In ortho PT  2: Vestibular / Balance: Follow up with vestibular PT.  3: Ocular:  Monitor for recovery and intervene as indicated.    Functional Limitations include:  Patient is unable to participate in a FULL TIME WORK SCHEDULE at this time and is working part time due to symtpom report and need for modifications.    Disabilities:  Patient will be unable to fulfill work duties at prior level without recovery from injury.                 PLAN:  PROGRESS TOWARD DISCHARGE CRITERIA   Symptom free at rest, or at previous subjective symptom baseline. No   Symptom free with cognitive exertion.   No  Patient has not successfully completed full day of non-physical work duties without increase in symptoms.   Successfully completed at least 24 hours on the GREEN stage at school or work. No   Normalized objective findings. No  See above objective testing notes for details.   Completed the RTP-3 readiness checklist  (see scanned checklist in chart) and is ready to advance to RTP-3. No, patient is not ready to advance to RTP-3.     Patient has not successfully completed all required steps above for discharge from physical therapy.    Patient does require continued skilled physical therapy intervention to progress toward above stated goals.        Recommendations:   Continue with established plan of care.  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  Vestibular / Concussion PT 1/week to address vestibular dysfunction.    NEXT VISIT:  Progress HEP and VOR exercises.  Initiate ambulatory exercises as indicated.    Therapist Signature:    Lindsay Elliott, [place]  #[id number]  Physical Therapist      03/17/2016

## 2016-03-22 ENCOUNTER — Ambulatory Visit: Payer: BLUE CROSS/BLUE SHIELD

## 2016-03-22 ENCOUNTER — Ambulatory Visit: Payer: BLUE CROSS/BLUE SHIELD | Admitting: Physical Medicine & Rehabilitation

## 2016-03-22 DIAGNOSIS — G4486 Cervicogenic headache: Secondary | ICD-10-CM

## 2016-03-22 DIAGNOSIS — M542 Cervicalgia: Secondary | ICD-10-CM

## 2016-03-22 DIAGNOSIS — S060X1D Concussion with loss of consciousness of 30 minutes or less, subsequent encounter: Secondary | ICD-10-CM

## 2016-03-22 DIAGNOSIS — R29898 Other symptoms and signs involving the musculoskeletal system: Secondary | ICD-10-CM
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
## 2016-03-24 ENCOUNTER — Ambulatory Visit: Payer: BLUE CROSS/BLUE SHIELD

## 2016-03-25 ENCOUNTER — Ambulatory Visit: Payer: BLUE CROSS/BLUE SHIELD

## 2016-03-29 ENCOUNTER — Ambulatory Visit: Payer: BLUE CROSS/BLUE SHIELD

## 2016-03-29 ENCOUNTER — Ambulatory Visit: Payer: BLUE CROSS/BLUE SHIELD | Admitting: Physical Medicine & Rehabilitation

## 2016-03-29 DIAGNOSIS — M542 Cervicalgia: Secondary | ICD-10-CM

## 2016-03-29 DIAGNOSIS — G4486 Cervicogenic headache: Secondary | ICD-10-CM

## 2016-03-29 DIAGNOSIS — S060X1D Concussion with loss of consciousness of 30 minutes or less, subsequent encounter: Secondary | ICD-10-CM
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
## 2016-03-30 ENCOUNTER — Ambulatory Visit: Payer: BLUE CROSS/BLUE SHIELD | Attending: Physical Medicine & Rehabilitation

## 2016-03-30 DIAGNOSIS — S060X0D Concussion without loss of consciousness, subsequent encounter: Secondary | ICD-10-CM

## 2016-03-30 DIAGNOSIS — X58XXXD Exposure to other specified factors, subsequent encounter: Secondary | ICD-10-CM | POA: Insufficient documentation

## 2016-03-30 DIAGNOSIS — S069X1D Unspecified intracranial injury with loss of consciousness of 30 minutes or less, subsequent encounter: Secondary | ICD-10-CM | POA: Insufficient documentation
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
## 2016-04-01 ENCOUNTER — Ambulatory Visit: Payer: BLUE CROSS/BLUE SHIELD

## 2016-04-01 DIAGNOSIS — M542 Cervicalgia: Secondary | ICD-10-CM

## 2016-04-01 DIAGNOSIS — G4486 Cervicogenic headache: Secondary | ICD-10-CM

## 2016-04-01 DIAGNOSIS — R29898 Other symptoms and signs involving the musculoskeletal system: Secondary | ICD-10-CM
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
## 2016-04-05 ENCOUNTER — Ambulatory Visit: Payer: BLUE CROSS/BLUE SHIELD

## 2016-04-05 DIAGNOSIS — S060X1D Concussion with loss of consciousness of 30 minutes or less, subsequent encounter: Secondary | ICD-10-CM

## 2016-04-05 DIAGNOSIS — M542 Cervicalgia: Secondary | ICD-10-CM

## 2016-04-05 DIAGNOSIS — G4486 Cervicogenic headache: Secondary | ICD-10-CM

## 2016-04-05 DIAGNOSIS — R29898 Other symptoms and signs involving the musculoskeletal system: Secondary | ICD-10-CM
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
## 2016-04-08 ENCOUNTER — Ambulatory Visit: Payer: BLUE CROSS/BLUE SHIELD

## 2016-04-08 DIAGNOSIS — S060X1D Concussion with loss of consciousness of 30 minutes or less, subsequent encounter: Secondary | ICD-10-CM

## 2016-04-08 DIAGNOSIS — M542 Cervicalgia: Secondary | ICD-10-CM

## 2016-04-08 DIAGNOSIS — G4486 Cervicogenic headache: Secondary | ICD-10-CM
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
## 2016-04-12 ENCOUNTER — Ambulatory Visit: Payer: BLUE CROSS/BLUE SHIELD

## 2016-04-12 DIAGNOSIS — S060X1D Concussion with loss of consciousness of 30 minutes or less, subsequent encounter: Secondary | ICD-10-CM

## 2016-04-12 DIAGNOSIS — R29898 Other symptoms and signs involving the musculoskeletal system: Secondary | ICD-10-CM

## 2016-04-12 DIAGNOSIS — G4486 Cervicogenic headache: Secondary | ICD-10-CM

## 2016-04-12 DIAGNOSIS — M542 Cervicalgia: Secondary | ICD-10-CM

## 2016-04-12 NOTE — Progress Notes (Signed)
[facility]  [address]  [place], [place]  [address]  Phone:  [phone]  Fax:  [phone]    Cervical Spine:  PHYSICAL THERAPY DAILY TREATMENT NOTE    PATIENT: Lindsay Elliott DOB: 20-May-1946   MR #: [id number]  AGE: 70 y.o.    FACILITY PROVIDER #: [id number] PRIMARY MD: Gregor Hams, MD    HICN# Medicare Sub. Num: [id number] A DIAGNOSES: Neck pain [M54.2]      Date of Service PT Received On: 04/12/16   Treatment Time Start Time: 0700 to Stop Time: 0800   Time Calculation Time Calculation (min): 60 min   Visit # PT Visit  PT Visit Number: 15/25(#8 cervical)   Units Billed   Therapeutic Interventions  $ PT Ultrasound (41324): 1 Units  $ PT Therapeutic Exercise [phone]): 2 Units     [name] referred for physical therapy services by: [name], MD    Certification period, precautions, medications and allergies and goals copied from initial evaluation - reviewed and reconciled today.  [name], PT 04/12/2016  CERTIFICATION DATES:02/23/2016- 05-24-16  Start of Care: 02/23/2016  Follow up with Medical Provider by10-26-17if not discharged before date.  Date of Injury: August 92017    HISTORY OF ASTHMA? no    RISK FACTORS THAT MAY PREDISPOSE PATIENT TO PROLONGED RECOVERY:  None.    IMPACT TEST ON FILE? No Baseline testing performed.    Treatment Diagnosis:   Concussion with LOC <30 minutes S06.0X1.D  Cervicalgia (neck pain) M54.2  Post Traumatic Headache G44.3     EXAMINATION:  Subjective Report:   Has only take Tylenol once since last seen and last 10 days has been best.  Has taken Amitriptyline now for about a week.     Reports that every time she gets up from sitting or if picks something up off of the floor she is dizzy w/ loss of balance and stumbles but denies falling.    Pain:    HA = 0/10    Objective Findings today:   Right rotation is restricted 50%-75% and at end range she gets blurred  vision    INTERVENTION: progressed exercises per flow sheet  Treatment Performed:  Brief manual stretch in sitting but deferred after rotation b/c of blurred vision  Modalities for pain relief: [place] to upper cervical erector spinae 1 MHz, 2 w/cm2 at 50% 8 min total to decrease ms tightness, promote healing.    Therapeutic exercise:   Exercise Specifics Date  03/29/16 Date  04/01/16 Date  04/05/16 Date  04/08/16 Date  04/12/16 Date   UBE  L1  x3'f/2'b L1  x3'f/2'b L1  x3'f/2'b L2  x3'f/2'b L2  x3'f/2'b    Rows  Red  2 x 12 Red  2 x 12 Red  2 x 12 Green  2 x 15 Green  2 x 15    Horizontal Abduction  Red  2 x 12 Red  2 x 12 Red  2 x 12 Green  2 x 15 Green  2 x 15    OA flexions mirror 2 x 10 2 x 10 2 x 10 2 x 10 2 x 10    shrugs  Red  2 x 12 Red  2 x 12 Red  2 x 12 Green  2 x 15 Green  2 x 15    Pectoral stretch  30"  X 3 30"  X 3 30"  X 3  30"  X 3 30"  X 3    Wall Angels  2 x 10 2 x 10 2 x 10 2 x 10 2 x 10    Standing shoulder:  flexion   0#  X 10 0#  X 10 1#  X 10 1#  X 10    abduction   0#  X 10 0#  X 10 1#  X 10 1#  X 10    "sweeps"   0#  X 10 0#  X 10 1#  X 10 1#  X 10                                                                                                  Patient Education: cervical and OA postures  Patient was educated on OA posture.    Patient did verbalize and demonstrate an understanding of this education but needs reinforcement.   Home Exercise Program was reviewed today.    EVALUATION AND DIAGNOSIS:   Lindsay Elliott is a 70 y.o. female presents to physical therapy today w/ no HA or minor HA for several days now.  She continues to have c/o's of balance issues.  Right rotation is restricted 50%-75% and at end range she gets blurred vision (notified vestibular PT, Burnard Hawthorne)    Functional Limitations include:  Patient is able to participate in full work day, however with symptom report and need for modifications.  Patient is unable to participate in any recreational physical activities at this  time.    Disabilities:  Patient will be unable to fulfill work duties at prior level without recovery from injury.  Patient is unable to resume safe participation in recreational physcial activities until recovery from injury.     Progress toward Functional Goals:  Short Term Goals:    1.  The patient will be educated in proper rest and nutrition strategies to assist in recovery process.  2.  The patient will be educated on the pathophysiology of concussion along with the rest and nutrition recommendations to promote compliance and recovery.    Long Term Goals:    1.  The patient will report no subjective symptoms and be able to participate in a full academic and/or work day without symptoms indicating appropriate progress toward recovery.    2.  The patient will be free of objective symptoms indicating a return to prior level of function.   3.  The patient will be taken through the return to play (RTP) protocol successfully indicating recovery from injury and return to prior level of function.     STG Progress Toward Goals   1 Met    2 Met      LTG's Progress Toward Goals   1 Progressing   2 Progressing   3 Not Addressed Today       PLAN: vestibular f/u today, await assessment  Patient requires continued skilled intervention in order to increase patient safety and independence with daily activities., decrease pain. and meet above mentioned functional goals.  Focus on OA stretch and posture next visit.    Therapist  Signature:    Humphrey Guerreiro L. Tonia Brooms      04/12/2016

## 2016-04-12 NOTE — Progress Notes (Signed)
[facility]  [address] [place] [address]  Phone: [phone]      Fax: [phone]    PHYSICAL THERAPY DAILY NOTE - CONCUSSION MANAGEMENT            REFERRED BY: [name], MD    PATIENT: Lindsay Elliott DOB: September 25, 1945   MR #: [id number]  AGE: 70 y.o.    FACILITY PROVIDER #: [phone] PRIMARY MD: Gregor Hams, MD      Date of Service PT Received On: 04/12/16   Treatment Time Start Time: 0800 to Stop Time: 0840   Time Calculation Time Calculation (min): 40 min   Visit # PT Visit  PT Visit Number: 16/25 (#6 vestibular)   Units Billed   Therapeutic Interventions  $ PT Neuromuscular Re-education [phone]): 2 Units  $ PT Therapeutic Activity (97530): 1 unit     [name] referred for physical therapy services by: [name], MD    Certification period, precautions, medications, allergies and baseline testing status copied from initial evaluation - reviewed and reconciled today.  Kae Heller, PT 04/12/2016  Patient reports no changes to medication.    CERTIFICATION DATES:02/23/2016- 05-24-16  Start of Care: 02/23/2016  Follow up with Medical Provider by10-26-17if not discharged before date.  Date of Injury: August 92017    HISTORY OF ASTHMA? no    RISK FACTORS THAT MAY PREDISPOSE PATIENT TO PROLONGED RECOVERY:  None.    IMPACT TEST ON FILE? No Baseline testing performed.    Treatment Diagnosis:   Concussion with LOC <30 minutes S06.0X1.D  Cervicalgia (neck pain) M54.2  Post Traumatic Headache G44.3    Short Term Goals:  1. The patient will be educated in proper rest and nutrition strategies to assist in recovery process.  2. The patient will be educated on the pathophysiology of concussion along with the rest and nutrition recommendations to promote compliance and recovery.    Long Term Goals:  1. The patient will report no subjective symptoms and be able to participate in a full academic and/or work day without  symptoms indicating appropriate progress toward recovery.   2. The patient will be free of objective symptoms indicating a return to prior level of function.   3. The patient will be taken through the return to play (RTP) protocol successfully indicating recovery from injury and return to prior level of function.       *Working Toward the Above Goals: (copied from last visit)*    GOAL# Progress Toward Goals   1 Met    2 Met    3 Progressing   4 Progressing   5 Progressing     EXAMINATION:  Subjective:   Patient reports her main symptoms have been neck pain and headache. She reports after starting Amitriptyline, her headache has been much better. Pt does reports imbalance but denies vertigo. She reports if she gets up too quickly she gets dizzy.     Ortho PT reports cervical spine findings: Right rotation is restricted 50%-75% and at end range she gets blurred vision    PCSS / Pain Assessment: see scanned note  Symptom 04/12/2016   Headache    Nausea    Vomiting    Balance problems    Dizziness    Lightheadedness    Fatigue    Trouble falling asleep    Sleeping more than usual    Sleeping less than usual    Drowsiness  Sensitivity to light    Sensitivity to noise    Irritability    Sadness    Nervous / Anxious    Feeling more emotional    Numbness or tingling    Feeling slowed down    Difficulty concentrating    Difficulty remembering    Visual problems    Other    Total Score:    PCSS is improving since last visit.  Date of last symptom:  Still reporting symptoms.    Patient's SUBJECTIVE symptom report most closely aligns with the following symptom trajectory pattern:  PATIENT PRIOIRTY 1 PHYSICAL Headaches Cervical presentation Oculomotor presentation, Imbalance, Ocular Motor Strain    Clinical Findings:  Corrective Eyewear:  Yes, Glasses for driving  Wearing today? Yes            TEST RESULTS COMMENTS   CERVICAL EXAM    WNL WNL PrevVisit Imp NT    Cervical AROM []  []  []  [x]  11/1: Currently in cervical  PT   Cervical Isometrics []  []  []  [x]     Palpation []  []  []  [x]     Sharps Purser []  []  []  [x]     Alar Ligament []  []  []  [x]     Cervical Kinesthes. []  []  []  [x]      []  []  []  []      []  []  []  []               SIGHT AND FUNCTIONAL VISION    WNL WN PrevVisit Imp NT    Smooth Pursuits  Saccadic? []  []  [x]  []  10/12: Saccadic intrusions noted in horizontal planes midline and superior  11/14: only saccadic intrusions to left with 7/10 D   EOM / ROM [x]  []  []  []      Saccades H  Hypermetric  hypometric []  []  [x]  []  11/14: Hypometric - no symptoms   Saccades V  Hypermetric  Hypometric []  []  [x]  []  11/14: Hypometric - no symptoms   Acuity B []  []  [x]  []  10/12: Seated at 10 feet with glasses, 10/12   Acuity R []  []  [x]  []  10/12: 10/12   Acuity L  []  []  [x]  []  10/12: 10/12   NPC  Insufficiency? [x]  []  []  []     Accomm R []  [x]  []  []     Accomm L []  [x]  []  []     Cover / Uncover  Phorias?  Eso / Exo?  []  []  []  [x]      []  []  []  []      []  []  []  []               VISUAL / VESTIBULAR    WNL WNL Prev Visit Imp NT    VOR -H [x]  []  []  []  11/14 - standing, NBOS slowed for 30 seconds without symptoms in order to keep object stable   VOR - V [x]  []  []  []  11/14 - standing, NBOS WNL for 30 seconds without symptoms.   VOR-C []  []  []  [x]     VOG []  [x]  []  []  10/12:  No spontaneous or gaze evoked nystagmus noted    []  []  []  []      []  []  []  []               BALANCE    WNL WNL Prev Visit Imp NT    MCTSIB -1 []  [x]  []  []     MCTSIB - 2 []  [x]  []  []  10/19   MCTSIB -3 []  []  [x]  []  11/14   MCTSIB -4 []  []  [x]  []  11/14   Tandem  Gait []  []  []  [x]     Tandem w/ Cog []  []  []  [x]     Gait -H turns []  []  [x]  []  11/14:  Able to perform with head turns every 5 steps with good form - path deviation noted with every 4 steps or less   Gait -Vturns [x]  []  []  []  11/1   Neurocom SOT []  []  [x]  []  10/19: impaired contributions from visual and vestibular systems, falls in conditions 4/5/6.    []  []  []  []             PHYSIOLOGICAL PERFORMANCE - Assessed 03/17/16   Supine 142/69 P62 L UE Positional Testing Reveals:  4/10 dizziness upon standing, resolves within 10 seconds. Same dizziness as reported with PCSS.   Standing x1' 155/60 P56 L UE    Standing x2' 142/58 P58 R UE    Modified Balke TT Not assessed        IMPACT TESTING    WNL WNL Prev Visit Imp NT Impact Test administered to assess for symptom provocation with cognitive exertion.   ImPact Post- Injury Test []  []  []  [x]       ASSESSMENTS / INTERVENTION:   Re-assessment of objective impairments noted last visit performed - see above for details.    Review of daily nutrition log and educated on strategies to promote recovery.  Review of day and provided modification strategies to promote successful participation in non-physical daily activities.  Balance Training:  Standing on foam eyes open feet apart with supervision, standing on foam feet apart, eyes closed with supervision to min assist  Visual / Vestibular Training: standing x1 viewing horizontally, amb x1 viewing vertically (all within cervical spine ROM without pain)  Added visual mazes and saccades to HEP    Patient Education:  Home Exercise Program issued see above    EVALUATION AND DIAGNOSIS:   Lindsay Elliott is a 70 y.o. female presenting to clinic today with resolving symptoms of concussion. 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 Patient is now able to progress standing VOR exercise and is ready to start training balance on foam with supervision from husband.     Recommend return to work stage: YELLOW.     Patient's OBJECTIVE findings today most closely align with the following clinical trajectory pattern:  1: Cervical:  Patient participating in ortho PT  2: Ocular:  Issued HEP - see above for details.  3: Vestibular / Balance: Gaze stability impairment noted - issued HEP, see above for details.  Balance impairment noted - HEP issued.  See above for  details.    Functional Limitations include:  Patient is able to participate in full work day, however with symptom report and need for modifications.  Patient is unable to participate in any physical activities at this time.    Disabilities:  Patient will be unable to fulfill work duties at prior level without recovery from injury.  Patient is unable to resume safe participation in physical activities until recovery from injury.                 PLAN:  PROGRESS TOWARD DISCHARGE CRITERIA   Symptom free at rest, or at previous subjective symptom baseline. No   Symptom free with cognitive exertion.   No  Patient has not successfully completed full day of non-physical work duties without increase in symptoms.   Successfully completed at least 24 hours on the GREEN stage at school or work. No   Normalized objective findings. No  See above  objective testing notes for details.   Completed the RTP-3 readiness checklist (see scanned checklist in chart) and is ready to advance to RTP-3. No, patient is not ready to advance to RTP-3.     Patient has not successfully completed all required steps above for discharge from physical therapy.    Patient does require continued skilled physical therapy intervention to progress toward above stated goals.        Recommendations:   Continue with established plan of care.    NEXT VISIT:  Re-assess impaired objective findings noted above, issue HEP as indicated.  Neurocom Transport planner Testing  Follow up with Vestibular PT for consult.  Follow up with Orthopedic PT for consult.    Therapist Signature:    Burnard Hawthorne, PT, DPT   [facility]  [email or web address]      04/12/2016

## 2016-04-14 ENCOUNTER — Ambulatory Visit: Payer: BLUE CROSS/BLUE SHIELD

## 2016-04-15 ENCOUNTER — Ambulatory Visit: Payer: BLUE CROSS/BLUE SHIELD

## 2016-04-15 DIAGNOSIS — M542 Cervicalgia: Secondary | ICD-10-CM

## 2016-04-15 DIAGNOSIS — G4486 Cervicogenic headache: Secondary | ICD-10-CM

## 2016-04-15 DIAGNOSIS — S060X1D Concussion with loss of consciousness of 30 minutes or less, subsequent encounter: Secondary | ICD-10-CM

## 2016-04-15 DIAGNOSIS — R29898 Other symptoms and signs involving the musculoskeletal system: Secondary | ICD-10-CM

## 2016-04-15 NOTE — Progress Notes (Signed)
[facility]  [address]  [place], [place]  [address]  Phone:  [phone]  Fax:  [phone]    Cervical Spine:  PHYSICAL THERAPY DAILY TREATMENT NOTE    PATIENT: Lindsay Elliott DOB: 1946-04-07   MR #: [id number]  AGE: 70 y.o.    FACILITY PROVIDER #: [id number] PRIMARY MD: Gregor Hams, MD    HICN# Medicare Sub. Num: [id number] A DIAGNOSES: Neck pain [M54.2]      Date of Service PT Received On: 04/15/16   Treatment Time Start Time: 0715 to Stop Time: 0800   Time Calculation Time Calculation (min): 45 min   Visit # PT Visit  PT Visit Number: 17/25 (#9cervical)   Units Billed   Therapeutic Interventions  $ PT Ultrasound (70623): 1 Units  $ PT Therapeutic Exercise [phone]): 2 Units     [name] referred for physical therapy services by: [name], MD    Certification period, precautions, medications and allergies and goals copied from initial evaluation - reviewed and reconciled today.  [name], PT 04/15/2016  CERTIFICATION DATES:02/23/2016- 05-24-16  Start of Care: 02/23/2016  Follow up with Medical Provider by10-26-17if not discharged before date.  Date of Injury: August 92017    HISTORY OF ASTHMA? no    RISK FACTORS THAT MAY PREDISPOSE PATIENT TO PROLONGED RECOVERY:  None.    IMPACT TEST ON FILE? No Baseline testing performed.    Treatment Diagnosis:   Concussion with LOC <30 minutes S06.0X1.D  Cervicalgia (neck pain) M54.2  Post Traumatic Headache G44.3     EXAMINATION:  Subjective Report:   Arrives late, under stress, HA = 5/10    Objective Findings previous session:   Right rotation is restricted 50%-75% and at end range she gets blurred vision    INTERVENTION: progressed exercises per flow sheet  Treatment Performed:    Modalities for pain relief: [place] to upper cervical erector spinae 1 MHz, 2 w/cm2 at 50% 8 min total to decrease ms tightness, promote healing.    Therapeutic exercise:   Exercise  Specifics Date  03/29/16 Date  04/01/16 Date  04/05/16 Date  04/08/16 Date  04/12/16 Date  04/15/16   UBE  L1  x3'f/2'b L1  x3'f/2'b L1  x3'f/2'b L2  x3'f/2'b L2  x3'f/2'b L2  x3'f/2'b   Rows  Red  2 x 12 Red  2 x 12 Red  2 x 12 Green  2 x 15 Green  2 x 15 Blue  2 x 15   Horizontal Abduction  Red  2 x 12 Red  2 x 12 Red  2 x 12 Green  2 x 15 Green  2 x 15 Blue  2 x 15   OA flexions mirror 2 x 10 2 x 10 2 x 10 2 x 10 2 x 10 2 x 10   shrugs  Red  2 x 12 Red  2 x 12 Red  2 x 12 Green  2 x 15 Green  2 x 15 Blue  2 x 15   Pectoral stretch  30"  X 3 30"  X 3 30"  X 3 30"  X 3 30"  X 3 30"  X 3   Wall Angels  2 x 10 2 x 10 2 x 10 2 x 10 2 x 10 2 x 10   Standing shoulder:  flexion   0#  X 10 0#  X 10 1#  X  10 1#  X 10 1#  X 15   abduction   0#  X 10 0#  X 10 1#  X 10 1#  X 10 1#  X 15   "sweeps"   0#  X 10 0#  X 10 1#  X 10 1#  X 10 1#  X 15                                                                                                 Patient Education: cervical and OA postures  Patient was educated on OA posture.    Patient did verbalize and demonstrate an understanding of this education but needs reinforcement.   Home Exercise Program was reviewed today.    EVALUATION AND DIAGNOSIS:   [name] is a 70 y.o. female presents to physical therapy today w/ 5/10 HA which is more stress related and by end of PT session, HA = 0/10.    From previous session, Right rotation is restricted 50%-75% and at end range she gets blurred vision (notified vestibular PT, Burnard Hawthorne)    Functional Limitations include:  Patient is able to participate in full work day, however with symptom report and need for modifications.  Patient is unable to participate in any recreational physical activities at this time.    Disabilities:  Patient will be unable to fulfill work duties at prior level without recovery from injury.  Patient is unable to resume safe participation in recreational physcial activities until recovery from injury.      Progress toward Functional Goals:  Short Term Goals:    1.  The patient will be educated in proper rest and nutrition strategies to assist in recovery process.  2.  The patient will be educated on the pathophysiology of concussion along with the rest and nutrition recommendations to promote compliance and recovery.    Long Term Goals:    1.  The patient will report no subjective symptoms and be able to participate in a full academic and/or work day without symptoms indicating appropriate progress toward recovery.    2.  The patient will be free of objective symptoms indicating a return to prior level of function.   3.  The patient will be taken through the return to play (RTP) protocol successfully indicating recovery from injury and return to prior level of function.     STG Progress Toward Goals   1 Met    2 Met      LTG's Progress Toward Goals   1 Progressing   2 Progressing   3 Not Addressed Today       PLAN:   Patient requires continued skilled intervention in order to increase patient safety and independence with daily activities., decrease pain. and meet above mentioned functional goals.  Focus on OA stretch and posture next visit.    Therapist Signature:    Aram Beecham. Tonia Brooms      04/15/2016

## 2016-04-19 ENCOUNTER — Ambulatory Visit: Payer: BLUE CROSS/BLUE SHIELD

## 2016-04-19 DIAGNOSIS — S060X1D Concussion with loss of consciousness of 30 minutes or less, subsequent encounter: Secondary | ICD-10-CM

## 2016-04-19 DIAGNOSIS — M542 Cervicalgia: Secondary | ICD-10-CM

## 2016-04-19 DIAGNOSIS — G4486 Cervicogenic headache: Secondary | ICD-10-CM

## 2016-04-19 NOTE — Progress Notes (Signed)
[facility]  [address] [place] [address]  Phone: [phone]      Fax: [phone]    PHYSICAL THERAPY DAILY NOTE - CONCUSSION MANAGEMENT            REFERRED BY: [name], MD    PATIENT: Lindsay Elliott DOB: [birth year]   MR #: [id number]  AGE: 70 y.o.    FACILITY PROVIDER #: [phone] PRIMARY MD: Gregor Hams, MD      Date of Service PT Received On: 04/19/16   Treatment Time Start Time: 0800 to Stop Time: 0845   Time Calculation Time Calculation (min): 45 min   Visit # PT Visit  PT Visit Number: 19/25 (#7 vestibular)   Units Billed   Therapeutic Interventions  $ PT Neuromuscular Re-education [phone]): 3 Units     Lindsay Elliott referred for physical therapy services by: [name], MD    Certification period, precautions, medications, allergies and baseline testing status copied from initial evaluation - reviewed and reconciled today.  Kae Heller, PT 04/19/2016  Patient reports no changes to medication.    CERTIFICATION DATES:02/23/2016- 05-24-16  Start of Care: 02/23/2016  Follow up with Medical Provider by10-26-17if not discharged before date.  Date of Injury: August 92017    HISTORY OF ASTHMA? no    RISK FACTORS THAT MAY PREDISPOSE PATIENT TO PROLONGED RECOVERY:  None.    IMPACT TEST ON FILE? No Baseline testing performed.    Treatment Diagnosis:   Concussion with LOC <30 minutes S06.0X1.D  Cervicalgia (neck pain) M54.2  Post Traumatic Headache G44.3    Short Term Goals:  1. The patient will be educated in proper rest and nutrition strategies to assist in recovery process.  2. The patient will be educated on the pathophysiology of concussion along with the rest and nutrition recommendations to promote compliance and recovery.    Long Term Goals:  1. The patient will report no subjective symptoms and be able to participate in a full academic and/or work day without symptoms indicating appropriate progress  toward recovery.   2. The patient will be free of objective symptoms indicating a return to prior level of function.   3. The patient will be taken through the return to play (RTP) protocol successfully indicating recovery from injury and return to prior level of function.       *Working Toward the Above Goals: (copied from last visit)*    GOAL# Progress Toward Goals   1 Met    2 Met    3 Progressing   4 Progressing   5 Progressing     EXAMINATION:  Subjective:   Patient reports she feels 80% recovered from concussion. She feels off balance when she stands up and bends over. She says she feels normal when she is up and moving around and denies vertigo. It is more when she is changing positions. She reports she graduated from ortho PT.    PCSS / Pain Assessment: see scanned note  Symptom 04/19/2016   Headache    Nausea    Vomiting    Balance problems    Dizziness    Lightheadedness    Fatigue    Trouble falling asleep    Sleeping more than usual    Sleeping less than usual    Drowsiness    Sensitivity to light    Sensitivity to noise    Irritability    Sadness    Nervous /  Anxious    Feeling more emotional    Numbness or tingling    Feeling slowed down    Difficulty concentrating    Difficulty remembering    Visual problems    Other    Total Score:    PCSS is improving since last visit.  Date of last symptom:  Still reporting symptoms.    Patient's SUBJECTIVE symptom report most closely aligns with the following symptom trajectory pattern:  PATIENT PRIOIRTY 1 PHYSICAL Headaches Cervical presentation Oculomotor presentation, Imbalance, Ocular Motor Strain    Clinical Findings:  Corrective Eyewear:  Yes, Glasses for driving  Wearing today? Yes            TEST RESULTS COMMENTS   CERVICAL EXAM    WNL WNL PrevVisit Imp NT    Cervical AROM []  []  []  [x]  11/21: D/C from cervical PT this date   Cervical Isometrics []  []  []  [x]     Palpation []  []  []  [x]     Sharps Purser []  []  []  [x]     Alar Ligament []  []  []  [x]      Cervical Kinesthes. []  []  []  [x]      []  []  []  []      []  []  []  []               SIGHT AND FUNCTIONAL VISION    WNL WN PrevVisit Imp NT    Smooth Pursuits  Saccadic? []  []  [x]  []  10/12: Saccadic intrusions noted in horizontal planes midline and superior  11/14: only saccadic intrusions to left with 7/10 D  11/21: saccadic intrusions to left, no symptoms   EOM / ROM [x]  []  []  []      Saccades H  Hypermetric  hypometric []  []  [x]  []  11/21: Hypometric - no symptoms   Saccades V  Hypermetric  Hypometric []  []  [x]  []  11/21: Hypometric - no symptoms   Acuity B []  []  [x]  []  10/12: Seated at 10 feet with glasses, 10/12   Acuity R []  []  [x]  []  10/12: 10/12   Acuity L  []  []  [x]  []  10/12: 10/12   NPC  Insufficiency? [x]  []  []  []     Accomm R []  [x]  []  []     Accomm L []  [x]  []  []     Cover / Uncover  Phorias?  Eso / Exo?  []  []  []  [x]      []  []  []  []      []  []  []  []               VISUAL / VESTIBULAR    WNL WNL Prev Visit Imp NT    VOR -H [x]  []  []  []  11/21 - standing, NBOS slowed for 30 seconds without symptoms in order to keep object stable; object moves with ambulation   VOR - V [x]  []  []  []  11/21 - standing, NBOS WNL for 30 seconds without symptoms, object moves with ambulation   VOR-C []  []  []  [x]     VOG []  [x]  []  []  10/12:  No spontaneous or gaze evoked nystagmus noted    []  []  []  []      []  []  []  []               BALANCE    WNL WNL Prev Visit Imp NT    MCTSIB -1 []  [x]  []  []     MCTSIB - 2 []  [x]  []  []  10/19   MCTSIB -3 []  [x]  []  []  11/21   MCTSIB -4 []  []  [x]  []  11/21   Tandem Gait []  []  [x]  []   Tandem w/ Cog []  []  []  [x]     Gait -H turns []  []  [x]  []  11/14:  Able to perform with head turns every 5 steps with good form - path deviation noted with every 4 steps or less   Gait -Vturns [x]  []  []  []  11/1   Neurocom SOT []  []  [x]  []  10/19: impaired contributions from visual and vestibular systems, falls in conditions 4/5/6.    []  []  []  []             PHYSIOLOGICAL PERFORMANCE - Assessed 03/17/16   Supine 142/69 P62 L UE Positional Testing Reveals:  4/10 dizziness upon standing, resolves within 10 seconds. Same dizziness as reported with PCSS.   Standing x1' 155/60 P56 L UE    Standing x2' 142/58 P58 R UE    Modified Balke TT Not assessed        IMPACT TESTING    WNL WNL Prev Visit Imp NT Impact Test administered to assess for symptom provocation with cognitive exertion.   ImPact Post- Injury Test []  []  []  [x]       ASSESSMENTS / INTERVENTION:   Re-assessment of objective impairments noted last visit performed - see above for details.    Review of daily nutrition log and educated on strategies to promote recovery.  Review of day and provided modification strategies to promote successful participation in non-physical daily activities.  Balance Training:  Standing on foam eyes open feet apart with supervision, standing on foam feet apart, eyes closed with supervision to min assist  Visual / Vestibular Training: standing x1 viewing, amb x1 viewing vertically and horizontally  To continue visual mazes and saccades     Patient Education:  Home Exercise Program issued see above    EVALUATION AND DIAGNOSIS:   Lindsay Elliott is a 70 y.o. female presenting to clinic today with resolving symptoms of concussion. Pt with improved balance standing on foam, improved VOR, and improved ambulation. Pt with continued difficulty with eye exercises but admits to not doing them much at home.     Recommend return to work stage: YELLOW.     Patient's OBJECTIVE findings today most closely align with the following clinical trajectory pattern:  1: Ocular:  Issued HEP - see above for details.  2: Vestibular / Balance: Gaze stability impairment noted - issued HEP, see above for details.  Balance impairment noted - HEP issued.  See above for details.    Functional Limitations include:  Patient is able to participate in full work day, however with symptom report and need for  modifications.  Patient is unable to participate in any physical activities at this time.    Disabilities:  Patient will be unable to fulfill work duties at prior level without recovery from injury.  Patient is unable to resume safe participation in physical activities until recovery from injury.                 PLAN:  PROGRESS TOWARD DISCHARGE CRITERIA   Symptom free at rest, or at previous subjective symptom baseline. No   Symptom free with cognitive exertion.   No  Patient has not successfully completed full day of non-physical work duties without increase in symptoms.   Successfully completed at least 24 hours on the GREEN stage at school or work. No   Normalized objective findings. No  See above objective testing notes for details.   Completed the RTP-3 readiness checklist (see scanned checklist in chart) and is ready to advance to RTP-3. No, patient  is not ready to advance to RTP-3.     Patient has not successfully completed all required steps above for discharge from physical therapy.    Patient does require continued skilled physical therapy intervention to progress toward above stated goals.        Recommendations:   Continue with established plan of care.    NEXT VISIT:  Re-assess impaired objective findings noted above, issue HEP as indicated.  Neurocom Transport planner Testing  Follow up with Vestibular PT for consult.  Follow up with Orthopedic PT for consult.    Therapist Signature:    Burnard Hawthorne, PT, DPT   [facility]  [email or web address]      04/19/2016

## 2016-04-19 NOTE — Progress Notes (Signed)
[facility]  [address], [address]  [place], [place]  [address]  Phone:  [phone]  Fax:  [phone]    Cervical Spine:  PHYSICAL THERAPY DAILY TREATMENT NOTE    PATIENT: Lindsay Elliott DOB: September 04, 1945   MR #: [id number]  AGE: 70 y.o.    FACILITY PROVIDER #: [id number] PRIMARY MD: Gregor Hams, MD    HICN# Medicare Sub. Num: [id number] A DIAGNOSES: Neck pain [M54.2]      Date of Service PT Received On: 04/19/16   Treatment Time Start Time: 0700 to Stop Time: 0750   Time Calculation Time Calculation (min): 50 min   Visit # PT Visit  PT Visit Number: 18/25(#10 cervical)   Units Billed   Therapeutic Interventions  $ PT Ultrasound ([id number]): 1 Units  $ PT Therapeutic Exercise [phone]): 2 Units     Lindsay Elliott referred for physical therapy services by: [name], MD    Certification period, precautions, medications and allergies and goals copied from initial evaluation - reviewed and reconciled today.  [name], PT 04/19/2016  CERTIFICATION DATES:02/23/2016- 05-24-16  Start of Care: 02/23/2016  Follow up with Medical Provider by10-26-17if not discharged before date.  Date of Injury: August 92017    HISTORY OF ASTHMA? no    RISK FACTORS THAT MAY PREDISPOSE PATIENT TO PROLONGED RECOVERY:  None.    IMPACT TEST ON FILE? No Baseline testing performed.    Treatment Diagnosis:   Concussion with LOC <30 minutes S06.0X1.D  Cervicalgia (neck pain) M54.2  Post Traumatic Headache G44.3     EXAMINATION:  Subjective Report:   Doing very well with neck and HA's and no HA's since last time.    Objective Findings previous session:   Right rotation is restricted 50%-75% and at end range she gets blurred vision    INTERVENTION: exercises per flow sheet  Treatment Performed:    Modalities for pain relief: [place] to upper cervical erector spinae 1 MHz, 2 w/cm2 at 50% 8 min total to decrease ms tightness, promote healing.    Therapeutic  exercise:   Exercise Specifics Date  03/29/16 Date  04/01/16 Date  04/05/16 Date  04/08/16 Date  04/12/16 Date  04/15/16 Date  04/19/16   UBE  L1  x3'f/2'b L1  x3'f/2'b L1  x3'f/2'b L2  x3'f/2'b L2  x3'f/2'b L2  x3'f/2'b L2  x3'f/2'b   Rows  Red  2 x 12 Red  2 x 12 Red  2 x 12 Green  2 x 15 Green  2 x 15 Blue  2 x 15 Blue  2 x 15   Horizontal Abduction  Red  2 x 12 Red  2 x 12 Red  2 x 12 Green  2 x 15 Green  2 x 15 Blue  2 x 15 Blue  2 x 15   OA flexions mirror 2 x 10 2 x 10 2 x 10 2 x 10 2 x 10 2 x 10 2 x 10   shrugs  Red  2 x 12 Red  2 x 12 Red  2 x 12 Green  2 x 15 Green  2 x 15 Blue  2 x 15 Blue  2 x 15   Pectoral stretch  30"  X 3 30"  X 3 30"  X 3 30"  X 3 30"  X 3 30"  X 3 30"  X 3   [place]  2 x 10 2  x 10 2 x 10 2 x 10 2 x 10 2 x 10 2 x 10   Standing shoulder:  flexion   0#  X 10 0#  X 10 1#  X 10 1#  X 10 1#  X 15 1#  X 15   abduction   0#  X 10 0#  X 10 1#  X 10 1#  X 10 1#  X 15 1#  X 15   "sweeps"   0#  X 10 0#  X 10 1#  X 10 1#  X 10 1#  X 15 1#  X 15                                                                                                          Patient Education: staying w/ HEP  Patient was educated on OA posture.    Patient did verbalize and demonstrate an understanding of this education but needs reinforcement.   Home Exercise Program was reviewed today.    EVALUATION AND DIAGNOSIS:   Lindsay Elliott is a 70 y.o. female presents to physical therapy today no longer experiencing HA and is independent w/ HEP.  Cervical spine goals met.  From previous session, Right rotation is restricted 50%-75% and at end range she gets blurred vision (notified vestibular PT, Burnard Hawthorne)    PLAN:   D/C cervical PT to HEP.  Pt to continue w/ vestibular PT.    Therapist Signature:    Aram Beecham. Tonia Brooms      04/19/2016

## 2016-04-22 ENCOUNTER — Ambulatory Visit: Payer: BLUE CROSS/BLUE SHIELD

## 2016-04-26 ENCOUNTER — Ambulatory Visit: Payer: BLUE CROSS/BLUE SHIELD

## 2016-04-29 ENCOUNTER — Ambulatory Visit: Payer: BLUE CROSS/BLUE SHIELD

## 2016-05-03 ENCOUNTER — Ambulatory Visit: Payer: BLUE CROSS/BLUE SHIELD | Attending: Physical Medicine & Rehabilitation

## 2016-05-03 ENCOUNTER — Ambulatory Visit: Payer: BLUE CROSS/BLUE SHIELD

## 2016-05-03 DIAGNOSIS — S060X1D Concussion with loss of consciousness of 30 minutes or less, subsequent encounter: Secondary | ICD-10-CM

## 2016-05-03 DIAGNOSIS — X58XXXD Exposure to other specified factors, subsequent encounter: Secondary | ICD-10-CM | POA: Insufficient documentation

## 2016-05-03 DIAGNOSIS — S069X1D Unspecified intracranial injury with loss of consciousness of 30 minutes or less, subsequent encounter: Secondary | ICD-10-CM | POA: Insufficient documentation

## 2016-05-03 NOTE — Progress Notes (Signed)
[facility]  [address] [place] [address]  Phone: [phone]      Fax: [phone]    PHYSICAL THERAPY DAILY NOTE - CONCUSSION MANAGEMENT            REFERRED BY: [name], MD    PATIENT: Lindsay Elliott DOB: 1946-02-02   MR #: [id number]  AGE: 70 y.o.    FACILITY PROVIDER #: [phone] PRIMARY MD: Gregor Hams, MD      Date of Service PT Received On: 05/03/16   Treatment Time Start Time: 0805 to Stop Time: 0845   Time Calculation Time Calculation (min): 40 min   Visit #     Units Billed   Therapeutic Interventions  $ PT Neuromuscular Re-education [phone]): 3 Units     [name] referred for physical therapy services by: [name], MD    Certification period, precautions, medications, allergies and baseline testing status copied from initial evaluation - reviewed and reconciled today.  Kae Heller, PT 05/03/2016  Patient reports no changes to medication.    CERTIFICATION DATES:02/23/2016- 05-24-16  Start of Care: 02/23/2016  Follow up with Medical Provider by10-26-17if not discharged before date.  Date of Injury: August 92017    HISTORY OF ASTHMA? no    RISK FACTORS THAT MAY PREDISPOSE PATIENT TO PROLONGED RECOVERY:  None.    IMPACT TEST ON FILE? No Baseline testing performed.    Treatment Diagnosis:   Concussion with LOC <30 minutes S06.0X1.D  Cervicalgia (neck pain) M54.2  Post Traumatic Headache G44.3    Short Term Goals:  1. The patient will be educated in proper rest and nutrition strategies to assist in recovery process.  2. The patient will be educated on the pathophysiology of concussion along with the rest and nutrition recommendations to promote compliance and recovery.    Long Term Goals:  1. The patient will report no subjective symptoms and be able to participate in a full academic and/or work day without symptoms indicating appropriate progress toward recovery.   2. The patient will be free  of objective symptoms indicating a return to prior level of function.   3. The patient will be taken through the return to play (RTP) protocol successfully indicating recovery from injury and return to prior level of function.       *Working Toward the Above Goals: (copied from last visit)*    GOAL# Progress Toward Goals   1 Met    2 Met    3 Progressing   4 Progressing   5 Progressing     EXAMINATION:  Subjective:   Patient reports she feels 80% recovered from concussion. Pt reports she still has occasional headaches and when she over does it she gets a headache. She reports her balance is better and she is doing the exercises.     PCSS / Pain Assessment: not assessed  Symptom 05/03/2016   Headache    Nausea    Vomiting    Balance problems    Dizziness    Lightheadedness    Fatigue    Trouble falling asleep    Sleeping more than usual    Sleeping less than usual    Drowsiness    Sensitivity to light    Sensitivity to noise    Irritability    Sadness    Nervous / Anxious    Feeling more emotional    Numbness or tingling    Feeling slowed down  Difficulty concentrating    Difficulty remembering    Visual problems    Other    Total Score:    PCSS is improving since last visit.  Date of last symptom:  Still reporting symptoms.    Patient's SUBJECTIVE symptom report most closely aligns with the following symptom trajectory pattern:  PATIENT PRIOIRTY 1 PHYSICAL Headaches Cervical presentation Oculomotor presentation, Imbalance, Ocular Motor Strain    Clinical Findings:  Corrective Eyewear:  Yes, Glasses for driving  Wearing today? Yes            TEST RESULTS COMMENTS   CERVICAL EXAM    WNL WNL PrevVisit Imp NT    Cervical AROM []  []  []  [x]  11/21: D/C from cervical PT this date   Cervical Isometrics []  []  []  [x]     Palpation []  []  []  [x]     Sharps Purser []  []  []  [x]     Alar Ligament []  []  []  [x]     Cervical Kinesthes. []  []  []  [x]      []  []  []  []      []  []  []  []               SIGHT AND  FUNCTIONAL VISION    WNL WN PrevVisit Imp NT    Smooth Pursuits  Saccadic? []  []  [x]  []  10/12: Saccadic intrusions noted in horizontal planes midline and superior  11/14: only saccadic intrusions to left with 7/10 D  11/21: saccadic intrusions to left, no symptoms  12/5: saccadic intrusions laterally   EOM / ROM [x]  []  []  []      Saccades H  Hypermetric  hypometric []  []  [x]  []  12/5: Hypometric - no symptoms   Saccades V  Hypermetric  Hypometric []  []  [x]  []  12/5: slowed - no symptoms   Acuity B [x]  []  []  []  10/10: Seated at 10 feet with glasses   Acuity R [x]  []  []  []  10/10   Acuity L  []  []  [x]  []  10/12   NPC  Insufficiency? [x]  []  []  []     Accomm R []  [x]  []  []     Accomm L []  [x]  []  []     Cover / Uncover  Phorias?  Eso / Exo?  []  []  []  [x]      []  []  []  []      []  []  []  []               VISUAL / VESTIBULAR    WNL WNL Prev Visit Imp NT    VOR -H [x]  []  []  []  12/5: able to amb slowly but moves when move head quickly   VOR - V [x]  []  []  []  12/5: able to amb    VOR-C []  []  []  [x]     VOG []  [x]  []  []  10/12:  No spontaneous or gaze evoked nystagmus noted    []  []  []  []      []  []  []  []               BALANCE    WNL WNL Prev Visit Imp NT    MCTSIB -1 []  [x]  []  []     MCTSIB - 2 []  [x]  []  []  10/19   MCTSIB -3 []  [x]  []  []  11/21   MCTSIB -4 []  []  [x]  []  11/21   Tandem Gait []  []  [x]  []     Tandem w/ Cog []  []  []  [x]     Gait -H turns []  []  [x]  []  11/14:  Able to perform with head turns every 5 steps with good form - path deviation  noted with every 4 steps or less   Gait -Vturns [x]  []  []  []  11/1   Neurocom SOT []  []  [x]  []  12/5: impaired condition 4, falls 5 and 6 (some anxiety during test)    []  []  []  []            PHYSIOLOGICAL PERFORMANCE - Assessed 03/17/16   Supine 142/69 P62 L UE Positional Testing Reveals:  4/10 dizziness upon standing, resolves within 10 seconds. Same dizziness as reported with PCSS.   Standing x1' 155/60 P56 L UE    Standing x2' 142/58 P58 R  UE    Modified Balke TT Not assessed        IMPACT TESTING    WNL WNL Prev Visit Imp NT Impact Test administered to assess for symptom provocation with cognitive exertion.   ImPact Post- Injury Test []  []  []  [x]       ASSESSMENTS / INTERVENTION:   Re-assessment of objective impairments noted last visit performed - see above for details.    Review of daily nutrition log and educated on strategies to promote recovery.  Review of day and provided modification strategies to promote successful participation in non-physical daily activities.  Balance Training:  Standing on foam eyes open feet apart with supervision (progressed to horizontal and vertical head turns), standing on foam feet apart, eyes closed with supervision to min assist  Visual / Vestibular Training:amb x1 viewing vertically and horizontally  To continue visual mazes and saccades     Patient Education:  Home Exercise Program issued see above    EVALUATION AND DIAGNOSIS:   Nialah Saravia is a 70 y.o. female presenting to clinic today with resolving symptoms of concussion. Pt with impaired neurocom SOT with falls conditions 5 and 6. Reviewed foam exercises and patient was performing without challenge at home so discussed importance of bringing feet closer together to increase challenge to increase carryover and improvement in balance. Pt with continued oculomotor impairment, but improving. If not improved by next week, may benefit from developmental optometry consult.     Recommend return to work stage: YELLOW.     Patient's OBJECTIVE findings today most closely align with the following clinical trajectory pattern:  1: Ocular:  Issued HEP - see above for details.  2: Vestibular / Balance: Gaze stability impairment noted - issued HEP, see above for details.  Balance impairment noted - HEP issued.  See above for details.    Functional Limitations include:  Patient is able to participate in full work day, however with symptom report and need for  modifications.  Patient is unable to participate in any physical activities at this time.    Disabilities:  Patient will be unable to fulfill work duties at prior level without recovery from injury.  Patient is unable to resume safe participation in physical activities until recovery from injury.                 PLAN:  PROGRESS TOWARD DISCHARGE CRITERIA   Symptom free at rest, or at previous subjective symptom baseline. No   Symptom free with cognitive exertion.   No  Patient has not successfully completed full day of non-physical work duties without increase in symptoms.   Successfully completed at least 24 hours on the GREEN stage at school or work. No   Normalized objective findings. No  See above objective testing notes for details.   Completed the RTP-3 readiness checklist (see scanned checklist in chart) and is ready to advance to RTP-3. No, patient  is not ready to advance to RTP-3.     Patient has not successfully completed all required steps above for discharge from physical therapy.    Patient does require continued skilled physical therapy intervention to progress toward above stated goals.        Recommendations:   Continue with established plan of care.    NEXT VISIT:  Re-assess impaired objective findings noted above, progress VOR and balance  Dynamic gait  Follow up with Vestibular PT for consult.      Therapist Signature:    Burnard Hawthorne, PT, DPT   [facility]  [email or web address]      05/03/2016

## 2016-05-05 ENCOUNTER — Ambulatory Visit: Payer: BLUE CROSS/BLUE SHIELD

## 2016-05-06 ENCOUNTER — Ambulatory Visit: Payer: BLUE CROSS/BLUE SHIELD

## 2016-05-10 ENCOUNTER — Ambulatory Visit: Payer: BLUE CROSS/BLUE SHIELD

## 2016-05-10 ENCOUNTER — Ambulatory Visit: Payer: BLUE CROSS/BLUE SHIELD | Admitting: Physical Medicine & Rehabilitation

## 2016-05-10 DIAGNOSIS — S060X1D Concussion with loss of consciousness of 30 minutes or less, subsequent encounter: Secondary | ICD-10-CM
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
## 2016-05-17 ENCOUNTER — Ambulatory Visit: Payer: BLUE CROSS/BLUE SHIELD

## 2016-05-17 DIAGNOSIS — S060X1D Concussion with loss of consciousness of 30 minutes or less, subsequent encounter: Secondary | ICD-10-CM
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
## 2016-05-24 ENCOUNTER — Ambulatory Visit: Payer: BLUE CROSS/BLUE SHIELD

## 2016-05-26 ENCOUNTER — Ambulatory Visit: Payer: BLUE CROSS/BLUE SHIELD

## 2016-05-31 ENCOUNTER — Ambulatory Visit: Payer: BLUE CROSS/BLUE SHIELD | Attending: Physical Medicine & Rehabilitation

## 2016-05-31 DIAGNOSIS — S060X1D Concussion with loss of consciousness of 30 minutes or less, subsequent encounter: Secondary | ICD-10-CM

## 2016-05-31 DIAGNOSIS — S069X1D Unspecified intracranial injury with loss of consciousness of 30 minutes or less, subsequent encounter: Secondary | ICD-10-CM | POA: Insufficient documentation

## 2016-05-31 DIAGNOSIS — X58XXXD Exposure to other specified factors, subsequent encounter: Secondary | ICD-10-CM | POA: Insufficient documentation

## 2016-05-31 NOTE — Progress Notes (Signed)
[facility]  [address] [place] [address]  Phone: [phone]      Fax: [phone]    PHYSICAL THERAPY PLAN OF CARE UPDATE - CONCUSSION MANAGEMENT            REFERRED BY: [name], MD         Physician Signature:_________________________________   Date:__________________          PATIENT: Lindsay Elliott DOB: [birth year]   MR #: [id number]  AGE: 71 y.o.    FACILITY PROVIDER #: [phone] PRIMARY MD: Gregor Hams, MD      Date of Service PT Received On: 05/31/16   Treatment Time Start Time: 0815 to Stop Time: 0845   Time Calculation Time Calculation (min): 30 min   Visit #     Units Billed   Therapeutic Interventions  $ PT Neuromuscular Re-education [phone]): 2 Units     Lindsay Elliott referred for physical therapy services by: [name], MD    Certification period, precautions, medications, allergies and baseline testing status copied from initial evaluation - reviewed and reconciled today.  Kae Heller, PT 05/31/2016  Patient reports no changes to medication.    CERTIFICATION DATES:05/31/2016 to 07/29/2016    Start of Care: 02/23/2016  Follow up with Medical Provider by2/2/2018if not discharged before date.  Date of Injury: August 92017    HISTORY OF ASTHMA? no    RISK FACTORS THAT MAY PREDISPOSE PATIENT TO PROLONGED RECOVERY:  None.    IMPACT TEST ON FILE? No Baseline testing performed.    Treatment Diagnosis:   Concussion with LOC <30 minutes S06.0X1.D  Cervicalgia (neck pain) M54.2  Post Traumatic Headache G44.3    Short Term Goals:  1. The patient will be educated in proper rest and nutrition strategies to assist in recovery process.  2. The patient will be educated on the pathophysiology of concussion along with the rest and nutrition recommendations to promote compliance and recovery.    Long Term Goals:  1. The patient will report no subjective symptoms and be able to participate in a full academic and/or work  day without symptoms indicating appropriate progress toward recovery.   2. The patient will be free of objective symptoms indicating a return to prior level of function.   3. The patient will be taken through the return to play (RTP) protocol successfully indicating recovery from injury and return to prior level of function.       *Working Toward the Above Goals: (copied from last visit)*    GOAL# Progress Toward Goals   1 Met    2 Met    3 Met    4 Progressing   5 Discontinued     EXAMINATION:  Subjective:   Patient reports she feels 100% recovered from concussion. She denies symptoms for the last week. She reports she doesn't feel off balance and got up on a step ladder with no difficulty this week.       PCSS / Pain Assessment: not assessed  Symptom 05/31/2016   Headache    Nausea    Vomiting    Balance problems    Dizziness    Lightheadedness    Fatigue    Trouble falling asleep    Sleeping more than usual    Sleeping less than usual    Drowsiness    Sensitivity to light    Sensitivity to noise    Irritability    Sadness  Nervous / Anxious    Feeling more emotional    Numbness or tingling    Feeling slowed down    Difficulty concentrating    Difficulty remembering    Visual problems    Other    Total Score:    PCSS is improving since last visit.  Date of last symptom:  One week ago    Patient's SUBJECTIVE symptom report most closely aligns with the following symptom trajectory pattern:  PATIENT PRIOIRTY 1 PHYSICAL Headaches Cervical presentation Oculomotor presentation, Imbalance, Ocular Motor Strain    Clinical Findings:  Corrective Eyewear:  Yes, Glasses for driving  Wearing today? Yes            TEST RESULTS COMMENTS   CERVICAL EXAM    WNL WNL PrevVisit Imp NT    Cervical AROM []  []  []  [x]  11/21: D/C from cervical PT this date   Cervical Isometrics []  []  []  [x]     Palpation []  []  []  [x]     Sharps Purser []  []  []  [x]     Alar Ligament []  []  []  [x]     Cervical Kinesthes. []  []  []  [x]      []  []   []  []      []  []  []  []               SIGHT AND FUNCTIONAL VISION    WNL WN PrevVisit Imp NT    Smooth Pursuits  Saccadic? [x]  []  []  []     EOM / ROM [x]  []  []  []      Saccades H  Hypermetric  hypometric [x]  []  []  []     Saccades V  Hypermetric  Hypometric []  []  [x]  []   a little slowed   Acuity B [x]  []  []  []  10/10: Seated at 10 feet with glasses   Acuity R [x]  []  []  []  10/10   Acuity L  []  []  [x]  []  10/12   NPC  Insufficiency? [x]  []  []  []     Accomm R []  [x]  []  []     Accomm L []  [x]  []  []     Cover / Uncover  Phorias?  Eso / Exo?  []  []  []  [x]      []  []  []  []      []  []  []  []               VISUAL / VESTIBULAR    WNL WNL Prev Visit Imp NT    VOR -H [x]  []  []  []     VOR - V [x]  []  []  []     VOR-C []  []  []  [x]     VOG []  [x]  []  []  10/12:  No spontaneous or gaze evoked nystagmus noted    []  []  []  []      []  []  []  []               BALANCE    WNL WNL Prev Visit Imp NT    MCTSIB -1 []  [x]  []  []     MCTSIB - 2 []  [x]  []  []  10/19   MCTSIB -3 []  [x]  []  []  12/19: WNL   MCTSIB -4 []  []  []  [x]  NT: neurocom   Tandem Gait [x]  []  []  []      Tandem w/ Cog [x]  []  []  []     Gait -H turns []  []  [x]  []  1/2:  Mild LOB      Gait -Vturns [x]  []  []  []  1/2: mild LOB   Neurocom SOT [x]  []  [x]  []  1/2    []  []  []  []   PHYSIOLOGICAL PERFORMANCE - Assessed 03/17/16   Supine 142/69 P62 L UE 05/31/16: No dizziness upon changing positions   Standing x1' 155/60 P56 L UE    Standing x2' 142/58 P58 R UE    Modified Balke TT Not assessed        IMPACT TESTING    WNL WNL Prev Visit Imp NT Impact Test administered to assess for symptom provocation with cognitive exertion.   ImPact Post- Injury Test []  []  []  [x]       ASSESSMENTS / INTERVENTION:   Re-assessment of objective impairments noted last visit performed - see above for details.    Review of daily nutrition log and educated on strategies to promote recovery.  Review of day and provided modification strategies to promote successful  participation in non-physical daily activities.  Balance Training:  Standing on foam eyes open feet apart with supervision (progressed to horizontal and vertical head turns), standing on foam feet closer together, eyes closed with supervision to min assist  Visual / Vestibular Training:amb x1 viewing vertically and horizontally with checkerboard and   To continue  saccades     Patient Education:  Home Exercise Program issued see above    EVALUATION AND DIAGNOSIS:   Marrianne Sica is a 71 y.o. female presenting to clinic today with resolving symptoms of concussion. Pt with improved oculomotor exam, balance, and VOR. Pt has passed all tests, including Neurocom SOT and reports feeling 100% recovered from concussion. Noted some imbalance during dynamic gait and when asked, she reports she did not have this problem prior to concussion.     Recommend return to work stage: YELLOW.     Patient's OBJECTIVE findings today most closely align with the following clinical trajectory pattern:  1: Ocular:  Issued HEP - see above for details.  2: Vestibular / Balance: Gaze stability impairment noted - issued HEP, see above for details.  Balance impairment noted - HEP issued.  See above for details.    Functional Limitations include:  Patient is able to participate in full work day, however with symptom report and need for modifications.  Patient is unable to participate in any physical activities at this time.    Disabilities:  Patient will be unable to fulfill work duties at prior level without recovery from injury.  Patient is unable to resume safe participation in physical activities until recovery from injury.                 PLAN:  PROGRESS TOWARD DISCHARGE CRITERIA   Symptom free at rest, or at previous subjective symptom baseline. No   Symptom free with cognitive exertion.   No  Patient has not successfully completed full day of non-physical work duties without increase in symptoms.   Successfully completed at least 24  hours on the GREEN stage at school or work. No   Normalized objective findings. No  See above objective testing notes for details.   Completed the RTP-3 readiness checklist (see scanned checklist in chart) and is ready to advance to RTP-3. No, patient is not ready to advance to RTP-3.     Patient has not successfully completed all required steps above for discharge from physical therapy.    Patient does require continued skilled physical therapy intervention to progress toward above stated goals.  Recommend 1-2 sessions for dynamic gait training to prevent future complications.      Recommendations:   Continue with established plan of care.    NEXT VISIT:  Dynamic gait  Follow  up with Vestibular PT for consult.      Therapist Signature:    Burnard Hawthorne, PT, DPT   [facility]  [email or web address]      05/31/2016

## 2016-06-07 ENCOUNTER — Ambulatory Visit: Payer: BLUE CROSS/BLUE SHIELD

## 2016-06-14 ENCOUNTER — Ambulatory Visit: Payer: BLUE CROSS/BLUE SHIELD

## 2016-06-21 ENCOUNTER — Ambulatory Visit: Payer: BLUE CROSS/BLUE SHIELD

## 2016-06-21 DIAGNOSIS — S060X1D Concussion with loss of consciousness of 30 minutes or less, subsequent encounter: Secondary | ICD-10-CM

## 2016-06-21 NOTE — Progress Notes (Signed)
[facility]  [address] [place] [address]  Phone: [phone]      Fax: [phone]    PHYSICAL THERAPY DISCHARGE SUMMARY              PATIENT: Lindsay Elliott DOB: 10-01-1945   MR #: [id number]  AGE: 71 y.o.    FACILITY PROVIDER #: [id number] PRIMARY MD: Lindsay Hams, MD      Date of Service PT Received On: 06/21/16   Treatment Time Start Time: 0810 to Stop Time: 0840   Time Calculation Time Calculation (min): 30 min   Visit #     Units Billed   Therapeutic Interventions  $ PT Therapeutic Activity [phone]): 2 units     Lindsay Elliott referred for physical therapy services by: Lindsay Moynahan, MD    Certification period, precautions, medications, allergies and baseline testing status copied from initial evaluation - reviewed and reconciled today.  Lindsay Elliott, PT 06/21/2016  Patient reports no changes to medication.    CERTIFICATION DATES:05/31/2016 to 07/29/2016    Start of Care: 02/23/2016  Follow up with Medical Provider by2/2/2018if not discharged before date.  Date of Injury: August 92017    HISTORY OF ASTHMA? no    RISK FACTORS THAT MAY PREDISPOSE PATIENT TO PROLONGED RECOVERY:  None.    IMPACT TEST ON FILE? No Baseline testing performed.    Treatment Diagnosis:   Concussion with LOC <30 minutes S06.0X1.D  Cervicalgia (neck pain) M54.2  Post Traumatic Headache G44.3    Short Term Goals:  1. The patient will be educated in proper rest and nutrition strategies to assist in recovery process. MET  2. The patient will be educated on the pathophysiology of concussion along with the rest and nutrition recommendations to promote compliance and recovery. MET    Long Term Goals:  1. The patient will report no subjective symptoms and be able to participate in a full academic and/or work day without symptoms indicating appropriate progress toward recovery.  MET  2. The patient will be free of objective symptoms indicating a return  to prior level of function.  MET  3. The patient will be taken through the return to play (RTP) protocol successfully indicating recovery from injury and return to prior level of function.  DISCONTINUED    EXAMINATION:  Subjective:   Patient reports she feels 100% recovered from concussion. She denies symptoms or any difficulty.      PCSS / Pain Assessment: not assessed  Symptom 06/21/2016   Headache    Nausea    Vomiting    Balance problems    Dizziness    Lightheadedness    Fatigue    Trouble falling asleep    Sleeping more than usual    Sleeping less than usual    Drowsiness    Sensitivity to light    Sensitivity to noise    Irritability    Sadness    Nervous / Anxious    Feeling more emotional    Numbness or tingling    Feeling slowed down    Difficulty concentrating    Difficulty remembering    Visual problems    Other    Total Score:    PCSS is improving since last visit.  Date of last symptom:  One week ago    Patient's SUBJECTIVE symptom report most closely aligns with the following symptom trajectory pattern:  PATIENT PRIOIRTY 1 PHYSICAL Headaches Cervical presentation Oculomotor presentation,  Imbalance, Ocular Motor Strain    Clinical Findings:  Corrective Eyewear:  Yes, Glasses for driving  Wearing today? Yes            TEST RESULTS COMMENTS   CERVICAL EXAM    WNL WNL PrevVisit Imp NT    Cervical AROM []  []  []  [x]  11/21: D/C from cervical PT this date   Cervical Isometrics []  []  []  [x]     Palpation []  []  []  [x]     Sharps Purser []  []  []  [x]     Alar Ligament []  []  []  [x]     Cervical Kinesthes. []  []  []  [x]      []  []  []  []      []  []  []  []               SIGHT AND FUNCTIONAL VISION    WNL WN PrevVisit Imp NT    Smooth Pursuits  Saccadic? [x]  []  []  []     EOM / ROM [x]  []  []  []      Saccades H  Hypermetric  hypometric [x]  []  []  []     Saccades V  Hypermetric  Hypometric []  []  [x]  []   a little slowed   Acuity B [x]  []  []  []  10/10: Seated at 10 feet with glasses   Acuity R [x]  []  []  []   10/10   Acuity L  []  []  [x]  []  10/12   NPC  Insufficiency? [x]  []  []  []     Accomm R []  [x]  []  []     Accomm L []  [x]  []  []     Cover / Uncover  Phorias?  Eso / Exo?  []  []  []  [x]      []  []  []  []      []  []  []  []               VISUAL / VESTIBULAR    WNL WNL Prev Visit Imp NT    VOR -H [x]  []  []  []     VOR - V [x]  []  []  []     VOR-C []  []  []  [x]     VOG []  [x]  []  []  10/12:  No spontaneous or gaze evoked nystagmus noted    []  []  []  []      []  []  []  []               BALANCE    WNL WNL Prev Visit Imp NT    MCTSIB -1 []  [x]  []  []     MCTSIB - 2 []  [x]  []  []  10/19   MCTSIB -3 []  [x]  []  []  12/19: WNL   MCTSIB -4 []  []  []  [x]  NT: neurocom   Tandem Gait [x]  []  []  []      Tandem w/ Cog [x]  []  []  []     Gait -H turns [x]  []  [x]  []        Gait -Vturns [x]  []  []  []     Neurocom SOT [x]  []  [x]  []  1/2    []  []  []  []            PHYSIOLOGICAL PERFORMANCE - Assessed 03/17/16   Supine 142/69 P62 L UE 05/31/16: No dizziness upon changing positions   Standing x1' 155/60 P56 L UE    Standing x2' 142/58 P58 R UE    Modified Balke TT Not assessed        IMPACT TESTING    WNL WNL Prev Visit Imp NT Impact Test administered to assess for symptom provocation with cognitive exertion.   ImPact Post- Injury Test []  []  []  [x]       ASSESSMENTS / INTERVENTION:  Re-assessment of objective impairments noted last visit performed - see above for details.    Review of balance exercises for prevention.    Patient Education:  Home Exercise Program issued see above    EVALUATION AND DIAGNOSIS:   Lindsay Elliott is a 71 y.o. female presenting to clinic today with resolving symptoms of concussion. Pt with improved oculomotor exam, balance, and VOR. Pt has passed all tests, including Neurocom SOT and reports feeling 100% recovered from concussion.     Recommend return to work stage: GREEN    Patient's OBJECTIVE findings today most closely align with the following clinical trajectory pattern:  None    Functional  Limitations include:  None    Disabilities:  none    Function-Related G Codes  Function Related G Code: Other PT/OT Primary            PLAN:  PROGRESS TOWARD DISCHARGE CRITERIA   Symptom free at rest, or at previous subjective symptom baseline. Yes   Symptom free with cognitive exertion.   Yes   Successfully completed at least 24 hours on the GREEN stage at school or work. Yes   Normalized objective findings. YES   Completed the RTP-3 readiness checklist (see scanned checklist in chart) and is ready to advance to RTP-3. NA     Patient has successfully completed all required steps above for discharge from physical therapy.    Patient does not require continued skilled physical therapy intervention to progress toward above stated goals.  Recommend 1-2 sessions for dynamic gait training to prevent future complications.      Recommendations:   Discharge PT. All goals met.    Therapist Signature:    Burnard Hawthorne, PT, DPT   [facility]  [email or web address]      06/21/2016

## 2016-06-24 ENCOUNTER — Ambulatory Visit: Payer: BLUE CROSS/BLUE SHIELD

## 2016-06-24 DIAGNOSIS — R41840 Attention and concentration deficit: Secondary | ICD-10-CM

## 2016-06-24 DIAGNOSIS — R413 Other amnesia: Secondary | ICD-10-CM
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
## 2016-06-28 ENCOUNTER — Ambulatory Visit: Payer: BLUE CROSS/BLUE SHIELD

## 2017-03-28 ENCOUNTER — Ambulatory Visit: Payer: BLUE CROSS/BLUE SHIELD

## 2017-03-28 ENCOUNTER — Ambulatory Visit
Payer: BLUE CROSS/BLUE SHIELD | Attending: Physical Medicine & Rehabilitation | Admitting: Physical Medicine & Rehabilitation

## 2017-03-28 DIAGNOSIS — S060X1D Concussion with loss of consciousness of 30 minutes or less, subsequent encounter: Secondary | ICD-10-CM

## 2017-03-28 DIAGNOSIS — R42 Dizziness and giddiness: Secondary | ICD-10-CM | POA: Insufficient documentation

## 2017-03-28 DIAGNOSIS — G44329 Chronic post-traumatic headache, not intractable: Secondary | ICD-10-CM

## 2017-03-28 DIAGNOSIS — M542 Cervicalgia: Secondary | ICD-10-CM

## 2017-03-28 DIAGNOSIS — G44309 Post-traumatic headache, unspecified, not intractable: Secondary | ICD-10-CM | POA: Insufficient documentation

## 2017-03-28 DIAGNOSIS — S060X0D Concussion without loss of consciousness, subsequent encounter: Secondary | ICD-10-CM

## 2017-03-28 MED ORDER — DULOXETINE HCL 20 MG PO CPEP
20.0000 mg | ORAL_CAPSULE | Freq: Every day | ORAL | 0 refills | Status: DC
Start: 2017-03-28 — End: 2017-05-09
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
## 2017-03-30 ENCOUNTER — Ambulatory Visit: Payer: BLUE CROSS/BLUE SHIELD | Attending: Physical Medicine & Rehabilitation

## 2017-03-30 DIAGNOSIS — R42 Dizziness and giddiness: Secondary | ICD-10-CM | POA: Insufficient documentation

## 2017-03-30 DIAGNOSIS — G44309 Post-traumatic headache, unspecified, not intractable: Secondary | ICD-10-CM | POA: Insufficient documentation

## 2017-03-30 DIAGNOSIS — G44329 Chronic post-traumatic headache, not intractable: Secondary | ICD-10-CM

## 2017-03-30 DIAGNOSIS — S060X0D Concussion without loss of consciousness, subsequent encounter: Secondary | ICD-10-CM | POA: Insufficient documentation

## 2017-03-30 DIAGNOSIS — M542 Cervicalgia: Secondary | ICD-10-CM | POA: Insufficient documentation

## 2017-03-30 NOTE — Progress Notes (Signed)
Lindsay Elliott        BALANCE EXERCISES:  Standing on ground feet together eyes open 30 seconds, progress to eyes closed (STAND NEAR STABLE SURFACE OR WALL)    Progress to standing on pillow (on the ground) eyes open 30 seconds and then eyes closed 30 seconds. Repeat 2-3 times    2-3 times per day.

## 2017-03-30 NOTE — Progress Notes (Addendum)
[facility]  [address] [place] [address]  Phone: [phone]      Fax: [phone]    PHYSICAL THERAPY DAILY NOTE - CONCUSSION MANAGEMENT            REFERRED BY: [name], MD    PATIENT: Lindsay Elliott DOB: 1945-08-30   MR #: [id number]  AGE: 71 y.o.    FACILITY PROVIDER #: [phone] PRIMARY MD: Gregor Hams, MD      Date of Service PT Received On: 03/30/17   Treatment Time Start Time: 1300 to Stop Time: 1400   Time Calculation Time Calculation (min): 60 min   Visit # PT Visit  PT Visit Number: 2   Units Billed   Therapeutic Interventions  $ PT Ultrasound (32440): 1 Units  $ PT Manual Therapy (97140): 1 Unit  $ PT Therapeutic Activity [phone]): 2 units     [name] referred for physical therapy services by: [name], MD    Certification period, precautions, medications, allergies and baseline testing status copied from initial evaluation - reviewed and reconciled today.  Wilber Bihari, PT 04/10/2017  Patient reports yes changes to medication.    CERTIFICATION DATES:   03/28/2017 - 06/28/17  Start of Care:  03/28/2017  Follow up with Medical Provider by 04/28/17 if not discharged before date.  Date of Injury:   January 06 2016   ED/UCC Visit?  Yes, went to ED, no imaging performed  Imaging Performed?  No imaging noted in EMR, no imaging reported by patient.    Medications:  has a current medication list which includes the following prescription(s): duloxetine.    RISK FACTORS THAT MAY PREDISPOSE PATIENT TO PROLONGED RECOVERY, as well as considerations for recovery:  Concussion injury 1 year ago    Precautions:   HISTORY OF ASTHMA?  no    IMPACT TEST ON FILE?  No Baseline testing performed.    Treatment Diagnosis:    Concussion without LOC S06.0X0.D  Cervicalgia (neck pain)  M54.2  Post Traumatic Headache G44.3  Dizziness R42    Past Medical/Surgical History:  PastMedicalHistory        Past Medical History:   Diagnosis Date   .  Gallstone pancreatitis 03/31/2013      S/P Lap Chole   . Nerve pain     feet, behind eye        PastSurgicalHistory         Past Surgical History:   Procedure Laterality Date   . HYSTERECTOMY     . LAPAROSCOPIC APPENDECTOMY  02/09/2010    Ruptured appendix   . LAPAROSCOPIC, CHOLECYSTECTOMY  04/01/2013    For Gallstone pancreatitis   . TONSILECTOMY, ADENOIDECTOMY, BILATERAL MYRINGOTOMY AND TUBES             SHORT TERM GOALS:  To be met in 4 visits.  1.  The patient will be educated in proper rest and nutrition strategies to assist in recovery process.  2.  The patient will be educated on the pathophysiology of concussion and the goals of the recovery recommendations in an effort to support compliance and understanding.    LONG TERM GOALS:  To be met in 12 visits.  3 The patient will be free of objective symptoms indicating a return to prior level of function.  4 The pateint will report a successful return to their normal daily activities without symptom report - a return to prior level of function.      *  Working Toward the Above Goals: (copied from last visit)*    GOAL# Progress Toward Goals   1 Met   2 Met   3 Progressing   4 Not Addressed Today         EXAMINATION:  Subjective:   Patient reports her HA is worse to the back of her head, started Cymbalta and stopped amitriptyline as recommended by Dr Anette Riedel the other day. Started getting nauseous in middle of night, took the mediatation at 6pm. I don't think I'm as dizzy as last time.     PCSS / Pain Assessment:  Score is 0 for no symptoms, and grade 1-6, with 6 being the worst.   Symptom 04/10/2017   Headache 5 (occipital)   Nausea 4 (started after taking Cymbalta)   Vomiting    Balance problems 1   Dizziness    Lightheadedness    Fatigue    Trouble falling asleep    Sleeping more than usual 3   Sleeping less than usual    Drowsiness    Sensitivity to light    Sensitivity to noise    Irritability    Sadness    Nervous / Anxious    Feeling more emotional     Numbness or tingling    Feeling slowed down    Difficulty concentrating 4   Difficulty remembering 4   Visual problems    Other    Total Score: (MAX 132) 21 (was 21)   PCSS is consistent since last visit.  Date of last symptom:  Still reporting symptoms.    Patient's SUBJECTIVE symptom report most closely aligns with the following symptom trajectory pattern:  PATIENT PRIOIRTY 1 PHYSICAL Headaches Cervical presentation  PATIENT PRIORITY 2 PHYSICAL Nausea    Clinical Findings:  Corrective Eyewear:  Yes, Glasses for distance  Wearing today? Has with her  TEST RESULTS COMMENTS   CERVICAL EXAM    WNL WNL Prev Visit Imp NT    Cervical AROM []  []  [x]  []  Cervical rotation Right 45 deg, left 30 deg, flexion pulling to posterior neck   Cervical Isometrics []  [x]  []  []     Palpation []  []  [x]  []  Tender to suboccipitals, occipitalis, and C1 bilaterally   Sharps Purser []  [x]  []  []     Alar Ligament []  [x]  []  []     Cervical Kinesthes. []  []  []  []     VAT []  [x]  []  []     CRFT   X  Limited with rotation Bilateral                             SIGHT AND FUNCTIONAL VISION    WNL WN PrevVisit Imp NT    Smooth Pursuits  Saccadic? []  [x]  []  []     EOM / ROM []  [x]  []  []     Saccades  H  Hypermetric  hypometric [x]  []  []  []     Saccades  V  Hypermetric  Hypometric [x]  []  []  []     Acuity B []  [x]  []  []  10/10 seated at 10 ft   Acuity R []  []  [x]  []  10/12   Acuity L    []  []  [x]  []  10/12   NPC  Insufficiency? []  []  []  [x]  Test next session   Accomm  R [x]  []  []  []  7"   Accomm  L   Norms:   Age  Inches   10-12 3.5   13-16 3.9   18-30 4   33 6  36 7   42 10   48 13      [x]  []  []  []  9.5"   Cover / Uncover  Phorias?  Eso / Exo?      []  []  [x]  []  Unable to maintain stead focus with left eye     []  []  []  []        []  []  []  []       VISUAL / VESTIBULAR    WNL WNL Prev Visit Imp NT    VOR - H []  []  [x]  []  Letter moving, slow   VOR - V []  []  [x]  []  Slow, letter moving   VOR-C []  []  []  [x]     VOG []  []  []  [x]      []  []  []  []      []  []  []  []        BALANCE     WNL WNL Prev Visit Imp NT    MCTSIB - 1 [x]  []  []  []     MCTSIB - 2  []  []  [x]  []  Increased postural sway couldn't maintain in the center   MCTSIB - 3 []  []  [x]  []     MCTSIB - 4 []  []  [x]  []     Tandem Gait []  []  [x]  []  Unsteady; missteps   Tandem w/ Cog []  []  []  [x]  NT due to above test.    Gait - H turns []  []  [x]  []  D 3/10; deviated path; decreased gait speed   Gait - V turns []  []  [x]  []  Deviated path; unsteady; missteps   Neurocom SOT []  []  []  [x]      []  []  []  []       PHYSIOLOGICAL PERFORMANCE   Supine  Positional Testing Reveals:  Not tested today.   Standing x1'     Standing x2'     [place] Concussion TT WNL  []       Impaired  []     NT  [x]         IMPACT TESTING    WNL WNL Prev Visit Imp NT Impact Test administered to assess for symptom provocation with cognitive exertion.   ImPact Post- Injury Test []  []  []  [x]       ASSESSMENTS / INTERVENTION:   Re-assessment of objective impairments noted last visit performed - see above for details.    Review of daily nutrition log and educated on strategies to promote recovery.  Modalities for pain relief:  Continuous [place] 1.5 w/cm@ x 8 min to suboccipitals seated with head forward resting on pillows on plinth.  Manual therapy to cervical spine:  Gentle suboccipital release followed by Gr III C1 mobilizations for rotation 30" x 4 each side.  Cervical Strengthening: supine craniocervical flexion activation with biopressure feedback cuff 4 mmHg x 10 sec 10x, unable to perform 6 mmHG pressure without increase of HA pain.    Patient Education:  Home Exercise Program issued Balance - EO/EC on level and unlevel ground  Cervical Strengthening - handout issued for [facility]  Review of recovery protocol to maximize compliance and speed recovery.  Importance of daily walks for recovery.    EVALUATION AND DIAGNOSIS:   Lindsay Elliott is a 71 y.o. female presenting to clinic today with persistent symptoms of concussion. She continues to present with cervicogenic impairments, and is  also demonstrating vestibular impairments. Intervened with cervical treatment today due to chronicity of injury and primary concern is neck pain/HA. She did report decrease of HA to 1/10 following interventions today.     Pt does not have to  return to work.     Patient's OBJECTIVE findings today most closely align with the following clinical trajectory pattern:  1: Cervical:  Educated on proper posture and its effect on pain / function., Assess for cervical kinesthesia next visit due to subjective complaints of HA.  2: Vestibular / Balance: Wait one week and reassess for recovery.  Balance impairment noted, educated on fall prevention strategies.  Balance impairment noted - HEP issued.  See above for details.    Functional Limitations include:  Patient is unable to resume normal activities such as household chores due to symptom exacerbation wtih activities.    Disabilities:  Patient is at risk for further / permanent impairment if not fully recovered from injury.      PLAN:  PROGRESS TOWARD DISCHARGE CRITERIA   Symptom free at rest, or at previous subjective symptom baseline. No   Symptom free with cognitive exertion.   No  Patient has not successfully completed full day of non-physical work duties without increase in symptoms.   Successfully completed at least 24 hours on the GREEN stage at school or work. No   Normalized objective findings. No  See above objective testing notes for details.     Patient has not successfully completed all required steps above for discharge from physical therapy.    Patient does require continued skilled physical therapy intervention to progress toward above stated goals.        Recommendations:   Continue with established plan of care.  ORTHOPEDIC PT EVAL is scheduled for interventions 2x/wk     NEXT VISIT:  Re-assess impaired objective findings noted above, issue HEP as indicated.  SOT next visit.    Therapist Signature:    Wilber Bihari, PT, DPT       04/10/2017

## 2017-04-06 ENCOUNTER — Ambulatory Visit: Payer: BLUE CROSS/BLUE SHIELD

## 2017-04-06 DIAGNOSIS — M542 Cervicalgia: Secondary | ICD-10-CM

## 2017-04-06 DIAGNOSIS — M5382 Other specified dorsopathies, cervical region: Secondary | ICD-10-CM

## 2017-04-06 DIAGNOSIS — R29898 Other symptoms and signs involving the musculoskeletal system: Secondary | ICD-10-CM

## 2017-04-06 DIAGNOSIS — G44229 Chronic tension-type headache, not intractable: Secondary | ICD-10-CM
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
## 2017-04-10 DIAGNOSIS — M5382 Other specified dorsopathies, cervical region: Secondary | ICD-10-CM | POA: Insufficient documentation

## 2017-04-10 DIAGNOSIS — G44229 Chronic tension-type headache, not intractable: Secondary | ICD-10-CM | POA: Insufficient documentation

## 2017-04-11 ENCOUNTER — Ambulatory Visit: Payer: BLUE CROSS/BLUE SHIELD

## 2017-04-11 DIAGNOSIS — M542 Cervicalgia: Secondary | ICD-10-CM

## 2017-04-11 DIAGNOSIS — G44229 Chronic tension-type headache, not intractable: Secondary | ICD-10-CM

## 2017-04-11 DIAGNOSIS — M5382 Other specified dorsopathies, cervical region: Secondary | ICD-10-CM

## 2017-04-11 DIAGNOSIS — R29898 Other symptoms and signs involving the musculoskeletal system: Secondary | ICD-10-CM

## 2017-04-11 DIAGNOSIS — G4486 Cervicogenic headache: Secondary | ICD-10-CM

## 2017-04-11 NOTE — Progress Notes (Signed)
[facility]  [address] [place] [address]  Phone: [phone]      Fax: [phone]    PHYSICAL THERAPY DAILY NOTE - CONCUSSION MANAGEMENT            REFERRED BY: [name], MD    PATIENT: Lindsay Elliott DOB: [birth year]   MR #: [id number]  AGE: 71 y.o.    FACILITY PROVIDER #: [id number] PRIMARY MD: Gregor Hams, MD      Date of Service PT Received On: 04/11/17   Treatment Time Start Time: 1400 to Stop Time: 1500   Time Calculation Time Calculation (min): 60 min   Visit # PT Visit  PT Visit Number: 4   Units Billed   Therapeutic Interventions  $ PT Ultrasound (52841): 1 Units  $ PT Manual Therapy (97140): 1 Unit  $ PT Therapeutic Activity [phone]): 2 units     [name] referred for physical therapy services by: [name], MD    Certification period, precautions, medications, allergies and baseline testing status copied from initial evaluation - reviewed and reconciled today.  [name], PT 04/11/2017  Patient reports yes changes to medication.    CERTIFICATION DATES:   03/28/2017 - 06/28/17  Start of Care:  03/28/2017  Follow up with Medical Provider by 04/28/17 if not discharged before date.  Date of Injury:   January 06 2016   ED/UCC Visit?  Yes, went to ED, no imaging performed  Imaging Performed?  No imaging noted in EMR, no imaging reported by patient.    Medications:  has a current medication list which includes the following prescription(s): duloxetine.    RISK FACTORS THAT MAY PREDISPOSE PATIENT TO PROLONGED RECOVERY, as well as considerations for recovery:  Concussion injury 1 year ago    Precautions:   HISTORY OF ASTHMA?  no    IMPACT TEST ON FILE?  No Baseline testing performed.    Treatment Diagnosis:    Concussion without LOC S06.0X0.D  Cervicalgia (neck pain)  M54.2  Post Traumatic Headache G44.3  Dizziness R42    Past Medical/Surgical History:  PastMedicalHistory        Past Medical History:   Diagnosis Date    . Gallstone pancreatitis 03/31/2013      S/P Lap Chole   . Nerve pain     feet, behind eye        PastSurgicalHistory         Past Surgical History:   Procedure Laterality Date   . HYSTERECTOMY     . LAPAROSCOPIC APPENDECTOMY  02/09/2010    Ruptured appendix   . LAPAROSCOPIC, CHOLECYSTECTOMY  04/01/2013    For Gallstone pancreatitis   . TONSILECTOMY, ADENOIDECTOMY, BILATERAL MYRINGOTOMY AND TUBES             SHORT TERM GOALS:  To be met in 4 visits.  1.  The patient will be educated in proper rest and nutrition strategies to assist in recovery process.  2.  The patient will be educated on the pathophysiology of concussion and the goals of the recovery recommendations in an effort to support compliance and understanding.    LONG TERM GOALS:  To be met in 12 visits.  3 The patient will be free of objective symptoms indicating a return to prior level of function.  4 The pateint will report a successful return to their normal daily activities without symptom report - a return to prior level of function.      *  Working Toward the Above Goals: (copied from last visit)*    GOAL# Progress Toward Goals   1 Met   2 Met   3 Progressing   4 Not Addressed Today         EXAMINATION:  Subjective:   HA's have improved as has sleep.  Says that she feels the PT is helping.  No nausea reported.     PCSS / Pain Assessment:    Patient's SUBJECTIVE symptom report most closely aligns with the following symptom trajectory pattern:  PATIENT PRIOIRTY 1 PHYSICAL Headaches Cervical presentation  PATIENT PRIORITY 2 PHYSICAL Nausea    Clinical Findings from last PT session:  Corrective Eyewear:  Yes, Glasses for distance  Wearing today? Has with her  TEST RESULTS COMMENTS   CERVICAL EXAM    WNL WNL Prev Visit Imp NT     Clinical Findings from last PT session:   Cervical AROM []  []  [x]  []  Cervical rotation Right 45 deg, left 30 deg, flexion pulling to posterior neck   Cervical Isometrics []  [x]  []  []     Palpation []  []  [x]  []   Tender to suboccipitals, occipitalis, and C1 bilaterally   Sharps Purser []  [x]  []  []     Alar Ligament []  [x]  []  []     Cervical Kinesthes. []  []  []  []     VAT []  [x]  []  []     CRFT   X  Limited with rotation Bilateral                             SIGHT AND FUNCTIONAL VISION    WNL WN PrevVisit Imp NT     Clinical Findings from last PT session:   Smooth Pursuits  Saccadic? []  [x]  []  []     EOM / ROM []  [x]  []  []     Saccades  H  Hypermetric  hypometric [x]  []  []  []     Saccades  V  Hypermetric  Hypometric [x]  []  []  []     Acuity B []  [x]  []  []  10/10 seated at 10 ft   Acuity R []  []  [x]  []  10/12   Acuity L    []  []  [x]  []  10/12   NPC  Insufficiency? []  []  []  [x]  Test next session   Accomm  R [x]  []  []  []  7"   Accomm  L   Norms:   Age  Inches   10-12 3.5   13-16 3.9   18-30 4   33 6   36 7   42 10   48 13      [x]  []  []  []  9.5"   Cover / Uncover  Phorias?  Eso / Exo?      []  []  [x]  []  Unable to maintain stead focus with left eye     []  []  []  []        []  []  []  []       VISUAL / VESTIBULAR    WNL WNL Prev Visit Imp NT     Clinical Findings from last PT session:   VOR - H []  []  [x]  []  Letter moving, slow   VOR - V []  []  [x]  []  Slow, letter moving   VOR-C []  []  []  [x]     VOG []  []  []  [x]      []  []  []  []      []  []  []  []        BALANCE    WNL WNL Prev Visit Imp NT     Clinical  Findings from last PT session:   MCTSIB - 1 [x]  []  []  []     MCTSIB - 2  []  []  [x]  []  Increased postural sway couldn't maintain in the center   MCTSIB - 3 []  []  [x]  []     MCTSIB - 4 []  []  [x]  []     Tandem Gait []  []  [x]  []  Unsteady; missteps   Tandem w/ Cog []  []  []  [x]  NT due to above test.    Gait - H turns []  []  [x]  []  D 3/10; deviated path; decreased gait speed   Gait - V turns []  []  [x]  []  Deviated path; unsteady; missteps   Neurocom SOT []  []  []  [x]      []  []  []  []         ASSESSMENTS / INTERVENTION:   Modalities for pain relief:  Continuous [place] 1.5 w/cm@ x 8 min to suboccipitals seated with head forward resting on pillows on plinth.  Manual therapy to  cervical spine:  Gentle suboccipital release followed by CV mobilizations.  Cervical Strengthening:   supine craniocervical flexion activation 10" x 10  Cervical flexion against gravity 10" x 10  Cervical SB isometrics 10" x 10  Cervical ROT isometrics 10" x 10  all without increase of HA pain.    EVALUATION AND DIAGNOSIS:   Duha Abair is a 71 y.o. female presenting to clinic today with persistent symptoms of concussion.   She has no HA at end of PT session.  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  2: Vestibular / Balance: Wait one week and reassess for recovery.  Balance impairment noted, educated on fall prevention strategies.  Balance impairment noted - HEP issued.  See above for details.    Functional Limitations include:  Patient is unable to resume normal activities such as household chores due to symptom exacerbation wtih activities.    Disabilities:  Patient is at risk for further / permanent impairment if not fully recovered from injury.      PLAN:  PROGRESS TOWARD DISCHARGE CRITERIA   Symptom free at rest, or at previous subjective symptom baseline. No   Symptom free with cognitive exertion.   No  Patient has not successfully completed full day of non-physical work duties without increase in symptoms.   Successfully completed at least 24 hours on the GREEN stage at school or work. No   Normalized objective findings. No  See above objective testing notes for details.     Patient has not successfully completed all required steps above for discharge from physical therapy.    Patient does require continued skilled physical therapy intervention to progress toward above stated goals.        Recommendations:    Continue with established plan of care.  [facility] PT is scheduled for interventions 2x/wk     NEXT VISIT:  Re-assess impaired objective findings noted above, issue HEP as indicated.  SOT next visit.    Therapist Signature:    Aram Beecham. Meriam Sprague, PT, DSc      04/11/2017

## 2017-04-12 ENCOUNTER — Ambulatory Visit: Payer: BLUE CROSS/BLUE SHIELD

## 2017-04-13 ENCOUNTER — Ambulatory Visit: Payer: BLUE CROSS/BLUE SHIELD

## 2017-04-13 DIAGNOSIS — R29898 Other symptoms and signs involving the musculoskeletal system: Secondary | ICD-10-CM

## 2017-04-13 DIAGNOSIS — M542 Cervicalgia: Secondary | ICD-10-CM

## 2017-04-13 DIAGNOSIS — G4486 Cervicogenic headache: Secondary | ICD-10-CM

## 2017-04-13 DIAGNOSIS — M5382 Other specified dorsopathies, cervical region: Secondary | ICD-10-CM

## 2017-04-13 DIAGNOSIS — G44229 Chronic tension-type headache, not intractable: Secondary | ICD-10-CM
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
## 2017-04-18 ENCOUNTER — Ambulatory Visit: Payer: BLUE CROSS/BLUE SHIELD

## 2017-04-18 DIAGNOSIS — M542 Cervicalgia: Secondary | ICD-10-CM

## 2017-04-18 DIAGNOSIS — G4486 Cervicogenic headache: Secondary | ICD-10-CM

## 2017-04-18 DIAGNOSIS — M5382 Other specified dorsopathies, cervical region: Secondary | ICD-10-CM

## 2017-04-18 DIAGNOSIS — R29898 Other symptoms and signs involving the musculoskeletal system: Secondary | ICD-10-CM

## 2017-04-18 DIAGNOSIS — G44229 Chronic tension-type headache, not intractable: Secondary | ICD-10-CM
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
## 2017-04-25 ENCOUNTER — Ambulatory Visit: Payer: BLUE CROSS/BLUE SHIELD

## 2017-04-27 ENCOUNTER — Ambulatory Visit: Payer: BLUE CROSS/BLUE SHIELD

## 2017-05-02 ENCOUNTER — Ambulatory Visit: Payer: BLUE CROSS/BLUE SHIELD | Attending: Physical Medicine & Rehabilitation

## 2017-05-02 DIAGNOSIS — S060X1D Concussion with loss of consciousness of 30 minutes or less, subsequent encounter: Secondary | ICD-10-CM

## 2017-05-02 DIAGNOSIS — S060X0D Concussion without loss of consciousness, subsequent encounter: Secondary | ICD-10-CM | POA: Insufficient documentation

## 2017-05-02 DIAGNOSIS — M542 Cervicalgia: Secondary | ICD-10-CM | POA: Insufficient documentation

## 2017-05-02 DIAGNOSIS — G44309 Post-traumatic headache, unspecified, not intractable: Secondary | ICD-10-CM | POA: Insufficient documentation

## 2017-05-02 DIAGNOSIS — R42 Dizziness and giddiness: Secondary | ICD-10-CM | POA: Insufficient documentation
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
## 2017-05-04 ENCOUNTER — Ambulatory Visit: Payer: BLUE CROSS/BLUE SHIELD

## 2017-05-04 DIAGNOSIS — G44229 Chronic tension-type headache, not intractable: Secondary | ICD-10-CM

## 2017-05-04 DIAGNOSIS — G4486 Cervicogenic headache: Secondary | ICD-10-CM

## 2017-05-04 DIAGNOSIS — M5382 Other specified dorsopathies, cervical region: Secondary | ICD-10-CM

## 2017-05-04 DIAGNOSIS — M542 Cervicalgia: Secondary | ICD-10-CM

## 2017-05-04 DIAGNOSIS — R29898 Other symptoms and signs involving the musculoskeletal system: Secondary | ICD-10-CM
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
## 2017-05-09 ENCOUNTER — Ambulatory Visit: Payer: BLUE CROSS/BLUE SHIELD

## 2017-05-09 ENCOUNTER — Ambulatory Visit: Payer: BLUE CROSS/BLUE SHIELD | Admitting: Physical Medicine & Rehabilitation

## 2017-05-09 DIAGNOSIS — M5382 Other specified dorsopathies, cervical region: Secondary | ICD-10-CM

## 2017-05-09 DIAGNOSIS — S060X1D Concussion with loss of consciousness of 30 minutes or less, subsequent encounter: Secondary | ICD-10-CM

## 2017-05-09 DIAGNOSIS — G4486 Cervicogenic headache: Secondary | ICD-10-CM

## 2017-05-09 DIAGNOSIS — R29898 Other symptoms and signs involving the musculoskeletal system: Secondary | ICD-10-CM

## 2017-05-09 DIAGNOSIS — M542 Cervicalgia: Secondary | ICD-10-CM

## 2017-05-09 DIAGNOSIS — G44229 Chronic tension-type headache, not intractable: Secondary | ICD-10-CM

## 2017-05-09 MED ORDER — DULOXETINE HCL 20 MG PO CPEP
20.0000 mg | ORAL_CAPSULE | Freq: Every day | ORAL | 0 refills | Status: DC
Start: 2017-05-09 — End: 2020-01-16
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
## 2017-05-11 ENCOUNTER — Ambulatory Visit: Payer: BLUE CROSS/BLUE SHIELD

## 2017-05-11 DIAGNOSIS — R29898 Other symptoms and signs involving the musculoskeletal system: Secondary | ICD-10-CM

## 2017-05-11 DIAGNOSIS — G44229 Chronic tension-type headache, not intractable: Secondary | ICD-10-CM

## 2017-05-11 DIAGNOSIS — G4486 Cervicogenic headache: Secondary | ICD-10-CM

## 2017-05-11 DIAGNOSIS — M542 Cervicalgia: Secondary | ICD-10-CM

## 2017-05-11 DIAGNOSIS — M5382 Other specified dorsopathies, cervical region: Secondary | ICD-10-CM
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
## 2017-05-16 ENCOUNTER — Ambulatory Visit: Payer: BLUE CROSS/BLUE SHIELD

## 2017-05-16 DIAGNOSIS — R29898 Other symptoms and signs involving the musculoskeletal system: Secondary | ICD-10-CM

## 2017-05-16 DIAGNOSIS — G44229 Chronic tension-type headache, not intractable: Secondary | ICD-10-CM

## 2017-05-16 DIAGNOSIS — M5382 Other specified dorsopathies, cervical region: Secondary | ICD-10-CM

## 2017-05-16 DIAGNOSIS — S060X1D Concussion with loss of consciousness of 30 minutes or less, subsequent encounter: Secondary | ICD-10-CM

## 2017-05-16 DIAGNOSIS — M542 Cervicalgia: Secondary | ICD-10-CM

## 2017-05-16 DIAGNOSIS — G4486 Cervicogenic headache: Secondary | ICD-10-CM
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
## 2017-05-16 NOTE — Progress Notes (Signed)
[facility]  [address] [place] [address]  Phone: [phone]      Fax: [phone]    PHYSICAL THERAPY DAILY NOTE - CONCUSSION MANAGEMENT            REFERRED BY: [name], MD    PATIENT: Lindsay Elliott DOB: 11/11/1945   MR #: [id number]  AGE: 71 y.o.    FACILITY PROVIDER #: [phone] PRIMARY MD: Gregor Hams, MD      Date of Service PT Received On: 05/16/17   Treatment Time Start Time: 0800 to Stop Time: 0845   Time Calculation Time Calculation (min): 45 min   Visit # PT Visit  PT Visit Number: 11   Units Billed   Therapeutic Interventions  $ PT Neuromuscular Re-education [phone]): 3 Units     Lindsay Elliott referred for physical therapy services by: [name], MD    Certification period, precautions, medications, allergies and baseline testing status copied from initial evaluation - reviewed and reconciled today.  Kae Heller, PT 05/16/2017  Patient reports no changes to medication.    CERTIFICATION DATES:   03/28/2017 - 06/28/17  Start of Care:  03/28/2017  Follow up with Medical Provider by 04/28/17 if not discharged before date.  Date of Injury:   January 06 2016   ED/UCC Visit?  Yes, went to ED, no imaging performed  Imaging Performed?  No imaging noted in EMR, no imaging reported by patient.    Medications:  has a current medication list which includes the following prescription(s): duloxetine.    RISK FACTORS THAT MAY PREDISPOSE PATIENT TO PROLONGED RECOVERY, as well as considerations for recovery:  Concussion injury 1 year ago    Precautions:   HISTORY OF ASTHMA?  no    IMPACT TEST ON FILE?  No Baseline testing performed.    Treatment Diagnosis:    Concussion without LOC S06.0X0.D  Cervicalgia (neck pain)  M54.2  Post Traumatic Headache G44.3  Dizziness R42    Past Medical/Surgical History:  PastMedicalHistory        Past Medical History:   Diagnosis Date   . Gallstone pancreatitis 03/31/2013      S/P Lap Chole   . Nerve  pain     feet, behind eye        PastSurgicalHistory         Past Surgical History:   Procedure Laterality Date   . HYSTERECTOMY     . LAPAROSCOPIC APPENDECTOMY  02/09/2010    Ruptured appendix   . LAPAROSCOPIC, CHOLECYSTECTOMY  04/01/2013    For Gallstone pancreatitis   . TONSILECTOMY, ADENOIDECTOMY, BILATERAL MYRINGOTOMY AND TUBES             SHORT TERM GOALS:  To be met in 4 visits.  1.  The patient will be educated in proper rest and nutrition strategies to assist in recovery process.  2.  The patient will be educated on the pathophysiology of concussion and the goals of the recovery recommendations in an effort to support compliance and understanding.    LONG TERM GOALS:  To be met in 12 visits.  3 The patient will be free of objective symptoms indicating a return to prior level of function.  4 The pateint will report a successful return to their normal daily activities without symptom report - a return to prior level of function.        *Working Toward the Above Goals: (copied from last  visit)*    GOAL# Progress Toward Goals   1 Met   2 Met   3 Progressing   4 Progressing   5 Progressing     EXAMINATION:  Subjective:   Patient reports about two months ago her headaches came back with nausea so she came back for some more neck therapy. She reports some imbalance now but denies vertigo or dizziness.    PCSS / Pain Assessment:  Score is 0 for no symptoms, and grade 1-6, with 6 being the worst.   Symptom 05/16/2017   Headache 4   Nausea    Vomiting    Balance problems 2-3   Dizziness    Lightheadedness    Fatigue    Trouble falling asleep    Sleeping more than usual    Sleeping less than usual    Drowsiness    Sensitivity to light    Sensitivity to noise    Irritability    Sadness    Nervous / Anxious    Feeling more emotional    Numbness or tingling    Feeling slowed down    Difficulty concentrating 3   Difficulty remembering    Visual problems    Other    Total Score: (MAX 132) 9-10   PCSS is  improving since last visit.  Date of last symptom:  Still reporting symptoms.    Patient's SUBJECTIVE symptom report most closely aligns with the following symptom trajectory pattern:  PATIENT PRIOIRTY 1 PHYSICAL Headaches Generalized pressure / pain, Imbalance  PATIENT PRIORITY 2 COGNITIVE Educated on the importance of cognitive breaks during the day.  Educated on the importance of no napping - support a regular sleep/wake cycle.    Clinical Findings:  Corrective Eyewear:  Yes, Bi-focal / Progressive lens  Wearing today? Yes  TEST RESULTS COMMENTS   CERVICAL EXAM    WNL WNL Prev Visit Imp NT    Cervical AROM []  []  []  [x]  Defer to ortho therapist   Cervical Isometrics []  []  []  [x]     Palpation []  []  []  [x]     Sharps Purser []  []  []  [x]     Alar Ligament []  []  []  [x]     Cervical Kinesthes. []  []  []  [x]      []  []  []  []      []  []  []  []       SIGHT AND FUNCTIONAL VISION    WNL WN PrevVisit Imp NT    Smooth Pursuits  Saccadic? []  [x]  []  []     EOM / ROM []  [x]  []  []     Saccades  H  Hypermetric  hypometric [x]  []  []  []     Saccades  V  Hypermetric  Hypometric []  []  [x]  []  slowed   Acuity B []  []  [x]  []  10/12   Acuity R []  []  []  [x]     Acuity L    []  []  []  [x]     NPC  Insufficiency? []  []  []  [x]     Accomm  R []  []  []  [x]     Accomm  L   Norms:   Age  Inches   10-12 3.5   13-16 3.9   18-30 4   33 6   36 7   42 10   48 13      []  []  []  [x]     Cover / Uncover  Phorias?  Eso / Exo?      []  []  []  [x]      []  []  []  []        []  []  []  []   VISUAL / VESTIBULAR    WNL WNL Prev Visit Imp NT    VOR - H [x]  []  []  []     VOR - V [x]  []  []  []     VOR-C [x]  []  []  []     VOG [x]  []  []  []     DVA [x]  []  []  []      []  []  []  []        BALANCE    WNL WNL Prev Visit Imp NT    MCTSIB - 1 []  []  []  [x]     MCTSIB - 2  []  []  []  [x]     MCTSIB - 3 []  []  []  [x]     MCTSIB - 4 [x]  []  []  []     Tandem Gait []  []  [x]  []  imbalance   Tandem w/ Cog []  []  [x]  []  imbalance   Gait - H turns []  []  [x]  []  imbalance   Gait - V turns []  []  [x]  []  imbalance   Neurocom SOT []   []  [x]  []   composite WNL, impaired condition 5    []  []  []  []       PHYSIOLOGICAL PERFORMANCE   Supine  Positional Testing Reveals:  Not tested today.   Standing x1'     Standing x2'     [place] Concussion TT WNL  []       Impaired  []     NT  [x]         IMPACT TESTING    WNL WNL Prev Visit Imp NT Impact Test administered to assess for symptom provocation with cognitive exertion.   ImPact Post- Injury Test []  []  []  [x]       ASSESSMENTS / INTERVENTION:   Re-assessment of objective impairments noted last visit performed - see above for details.    Balance Training:  See HEP below. Reviewed in detail.    Patient Education:  Home Exercise Program issued VOR- Handout handout issued  Balance - standing on foam with eyes closed  Dynamic gait - handout issued    EVALUATION AND DIAGNOSIS:   Lindsay Elliott is a 71 y.o. female presenting to clinic today with persistent symptoms of concussion. Pt with some mild balance difficulty. She had progressed to WNL on all tests previously so pt to start vestibular HEP again and have ortho therapist re-assess neurocom SOT and dynamic gait on last session. VOR WNL but had pt still perform once a day to maximize safety with functional mobility.         Patient's OBJECTIVE findings today most closely align with the following clinical trajectory pattern:  1: Vestibular / Balance: Balance impairment noted - HEP issued.  See above for details.    Functional Limitations include:  Patient is able to participate in full work day, however with symptom report and need for modifications.  Patient is unable to participate in any physical activities at this time.    Disabilities:  Patient will be unable to fulfill work duties at prior level without recovery from injury.  Patient is unable to resume safe participation in physical activities until recovery from injury.                 PLAN:  PROGRESS TOWARD DISCHARGE CRITERIA   Symptom free at rest, or at previous subjective symptom baseline. No    Symptom free with cognitive exertion.   No  Patient has not successfully completed full day of non-physical work duties without increase in symptoms.   Successfully completed at least 24 hours on the GREEN stage at  school or work. No   Normalized objective findings. No  See above objective testing notes for details.   Completed the RTP-4 readiness checklist (see scanned checklist in chart) and is ready to advance to RTP-4. No, patient is not ready to advance to RTP-4.     Patient has not successfully completed all required steps above for discharge from physical therapy.    Patient does require continued skilled physical therapy intervention to progress toward above stated goals.        Recommendations:   Continue with established plan of care.  Follow up with ortho/concussion specialist.      Therapist Signature:    Burnard Hawthorne, PT, DPT   [facility]  [email or web address]      05/16/2017

## 2017-05-18 ENCOUNTER — Ambulatory Visit: Payer: BLUE CROSS/BLUE SHIELD

## 2017-05-18 DIAGNOSIS — G44229 Chronic tension-type headache, not intractable: Secondary | ICD-10-CM

## 2017-05-18 DIAGNOSIS — M542 Cervicalgia: Secondary | ICD-10-CM

## 2017-05-18 DIAGNOSIS — R29898 Other symptoms and signs involving the musculoskeletal system: Secondary | ICD-10-CM
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
## 2017-05-25 ENCOUNTER — Ambulatory Visit: Payer: BLUE CROSS/BLUE SHIELD

## 2017-05-25 DIAGNOSIS — S060X1D Concussion with loss of consciousness of 30 minutes or less, subsequent encounter: Secondary | ICD-10-CM
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
## 2017-07-06 ENCOUNTER — Other Ambulatory Visit: Payer: Self-pay

## 2017-07-25 ENCOUNTER — Ambulatory Visit
Payer: BLUE CROSS/BLUE SHIELD | Attending: Physical Medicine & Rehabilitation | Admitting: Physical Medicine & Rehabilitation

## 2017-07-25 DIAGNOSIS — M1611 Unilateral primary osteoarthritis, right hip: Secondary | ICD-10-CM

## 2017-07-25 DIAGNOSIS — S060X1D Concussion with loss of consciousness of 30 minutes or less, subsequent encounter: Secondary | ICD-10-CM
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
## 2020-01-16 ENCOUNTER — Emergency Department: Payer: BLUE CROSS/BLUE SHIELD

## 2020-01-16 ENCOUNTER — Emergency Department
Admission: EM | Admit: 2020-01-16 | Discharge: 2020-01-16 | Disposition: A | Discharge status: Home / Self Care | Payer: BLUE CROSS/BLUE SHIELD | Attending: Emergency Medicine | Admitting: Emergency Medicine

## 2020-01-16 DIAGNOSIS — S20219A Contusion of unspecified front wall of thorax, initial encounter: Secondary | ICD-10-CM | POA: Insufficient documentation

## 2020-01-16 DIAGNOSIS — S161XXA Strain of muscle, fascia and tendon at neck level, initial encounter: Secondary | ICD-10-CM | POA: Insufficient documentation

## 2020-01-16 DIAGNOSIS — S0990XA Unspecified injury of head, initial encounter: Secondary | ICD-10-CM | POA: Insufficient documentation

## 2020-01-16 LAB — COMPREHENSIVE METABOLIC PANEL
ALT: 17 U/L (ref 0–55)
AST (SGOT): 18 U/L (ref 5–34)
Albumin/Globulin Ratio: 1.1 (ref 0.9–2.2)
Albumin: 3.7 g/dL (ref 3.5–5.0)
Alkaline Phosphatase: 70 U/L (ref 37–106)
Anion Gap: 11 (ref 5.0–15.0)
BUN: 27.3 mg/dL — ABNORMAL HIGH (ref 7.0–19.0)
Bilirubin, Total: 0.2 mg/dL (ref 0.2–1.2)
CO2: 20 mEq/L — ABNORMAL LOW (ref 22–29)
Calcium: 9.9 mg/dL (ref 7.9–10.2)
Chloride: 109 mEq/L (ref 100–111)
Creatinine: 1.4 mg/dL — ABNORMAL HIGH (ref 0.6–1.0)
Globulin: 3.5 g/dL (ref 2.0–3.6)
Glucose: 136 mg/dL — ABNORMAL HIGH (ref 70–100)
Potassium: 4.6 mEq/L (ref 3.5–5.1)
Protein, Total: 7.2 g/dL (ref 6.0–8.3)
Sodium: 140 mEq/L (ref 136–145)

## 2020-01-16 LAB — GFR: EGFR: 36.8

## 2020-01-16 LAB — PT AND APTT
PT INR: 1.1 (ref 0.9–1.1)
PT: 12.6 s (ref 10.1–12.9)
PTT: 26 s — ABNORMAL LOW (ref 27–39)

## 2020-01-16 LAB — CBC WITH MANUAL DIFFERENTIAL
Absolute NRBC: 0 10*3/uL (ref 0.00–0.00)
Atypical Lymphocytes %: 2 %
Atypical Lymphocytes Absolute: 0.16 10*3/uL — ABNORMAL HIGH (ref 0.00–0.00)
Band Neutrophils Absolute: 0.08 10*3/uL (ref 0.00–1.00)
Band Neutrophils: 1 %
Basophils Absolute Manual: 0 10*3/uL (ref 0.00–0.08)
Basophils Manual: 0 %
Cell Morphology: NORMAL
Eosinophils Absolute Manual: 0.08 10*3/uL (ref 0.00–0.44)
Eosinophils Manual: 1 %
Hematocrit: 37.3 % (ref 34.7–43.7)
Hgb: 12.4 g/dL (ref 11.4–14.8)
Lymphocytes Absolute Manual: 1.57 10*3/uL (ref 0.42–3.22)
Lymphocytes Manual: 20 %
MCH: 29.5 pg (ref 25.1–33.5)
MCHC: 33.2 g/dL (ref 31.5–35.8)
MCV: 88.6 fL (ref 78.0–96.0)
MPV: 10.4 fL (ref 8.9–12.5)
Monocytes Absolute: 0.86 10*3/uL — ABNORMAL HIGH (ref 0.21–0.85)
Monocytes Manual: 11 %
Neutrophils Absolute Manual: 5.1 10*3/uL (ref 1.10–6.33)
Nucleated RBC: 0 /100 WBC (ref 0.0–0.0)
Platelet Estimate: NORMAL
Platelets: 260 10*3/uL (ref 142–346)
RBC: 4.21 10*6/uL (ref 3.90–5.10)
RDW: 13 % (ref 11–15)
Segmented Neutrophils: 65 %
WBC: 7.84 10*3/uL (ref 3.10–9.50)

## 2020-01-16 MED ORDER — CYCLOBENZAPRINE HCL 10 MG PO TABS
10.0000 mg | ORAL_TABLET | Freq: Three times a day (TID) | ORAL | 0 refills | Status: AC | PRN
Start: 2020-01-16 — End: ?

## 2020-01-16 MED ORDER — SODIUM CHLORIDE 0.9 % IV BOLUS
1000.0000 mL | Freq: Once | INTRAVENOUS | Status: AC
Start: 2020-01-16 — End: 2020-01-16
  Administered 2020-01-16: 18:00:00 1000 mL via INTRAVENOUS

## 2020-01-16 MED ORDER — ONDANSETRON HCL 4 MG/2ML IJ SOLN
4.0000 mg | Freq: Once | INTRAMUSCULAR | Status: AC
Start: 2020-01-16 — End: 2020-01-16
  Administered 2020-01-16: 19:00:00 4 mg via INTRAVENOUS
  Filled 2020-01-16: qty 2

## 2020-01-16 MED ORDER — ONDANSETRON HCL 4 MG/2ML IJ SOLN
4.0000 mg | Freq: Once | INTRAMUSCULAR | Status: DC
Start: 2020-01-16 — End: 2020-01-16

## 2020-01-16 MED ORDER — HYDROCODONE-ACETAMINOPHEN 5-325 MG PO TABS
1.0000 | ORAL_TABLET | Freq: Four times a day (QID) | ORAL | 0 refills | Status: AC | PRN
Start: 2020-01-16 — End: 2020-01-23

## 2020-01-16 MED ORDER — CYCLOBENZAPRINE HCL 10 MG PO TABS
10.0000 mg | ORAL_TABLET | Freq: Once | ORAL | Status: AC
Start: 2020-01-16 — End: 2020-01-16
  Administered 2020-01-16: 21:00:00 10 mg via ORAL
  Filled 2020-01-16: qty 1

## 2020-01-16 MED ORDER — ONDANSETRON HCL 4 MG/2ML IJ SOLN
4.0000 mg | Freq: Once | INTRAMUSCULAR | Status: AC
Start: 2020-01-16 — End: 2020-01-16
  Administered 2020-01-16: 16:00:00 4 mg via INTRAVENOUS
  Filled 2020-01-16: qty 2

## 2020-01-16 MED ORDER — FENTANYL CITRATE (PF) 50 MCG/ML IJ SOLN (WRAP)
50.0000 ug | Freq: Once | INTRAMUSCULAR | Status: AC
Start: 2020-01-16 — End: 2020-01-16
  Administered 2020-01-16: 16:00:00 50 ug via INTRAVENOUS
  Filled 2020-01-16: qty 2

## 2020-01-16 MED ORDER — ONDANSETRON 4 MG PO TBDP
4.0000 mg | ORAL_TABLET | Freq: Three times a day (TID) | ORAL | 0 refills | Status: AC | PRN
Start: 2020-01-16 — End: ?

## 2020-01-16 MED ORDER — LIDOCAINE 5 % EX PTCH
1.0000 | MEDICATED_PATCH | CUTANEOUS | Status: DC
Start: 2020-01-16 — End: 2020-01-16
  Administered 2020-01-16: 21:00:00 1 via TRANSDERMAL
  Filled 2020-01-16: qty 1
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
## 2020-01-18 NOTE — Progress Notes (Signed)
I forwarded chart to f/u committee to make sure pt's attention is focused on incidental findings like thyroid abnormalities and lung abnormalities.

## 2020-01-20 ENCOUNTER — Encounter (INDEPENDENT_AMBULATORY_CARE_PROVIDER_SITE_OTHER): Payer: Self-pay

## 2021-02-23 ENCOUNTER — Other Ambulatory Visit: Payer: Self-pay

## 2021-02-23 ENCOUNTER — Encounter: Payer: Self-pay | Admitting: Internal Medicine

## 2021-02-23 ENCOUNTER — Ambulatory Visit: Payer: Federal, State, Local not specified - PPO | Admitting: Internal Medicine

## 2021-02-23 VITALS — BP 122/80 | HR 59 | Temp 98.1°F | Ht 63.5 in | Wt 145.0 lb

## 2021-02-23 DIAGNOSIS — G629 Polyneuropathy, unspecified: Secondary | ICD-10-CM | POA: Diagnosis not present

## 2021-02-23 DIAGNOSIS — F4310 Post-traumatic stress disorder, unspecified: Secondary | ICD-10-CM | POA: Insufficient documentation

## 2021-02-23 DIAGNOSIS — Z1239 Encounter for other screening for malignant neoplasm of breast: Secondary | ICD-10-CM

## 2021-02-23 DIAGNOSIS — Z23 Encounter for immunization: Secondary | ICD-10-CM

## 2021-02-23 DIAGNOSIS — E559 Vitamin D deficiency, unspecified: Secondary | ICD-10-CM

## 2021-02-23 DIAGNOSIS — E538 Deficiency of other specified B group vitamins: Secondary | ICD-10-CM

## 2021-02-23 DIAGNOSIS — H409 Unspecified glaucoma: Secondary | ICD-10-CM

## 2021-02-23 DIAGNOSIS — Z124 Encounter for screening for malignant neoplasm of cervix: Secondary | ICD-10-CM | POA: Insufficient documentation

## 2021-02-23 DIAGNOSIS — R634 Abnormal weight loss: Secondary | ICD-10-CM

## 2021-02-23 DIAGNOSIS — Z13820 Encounter for screening for osteoporosis: Secondary | ICD-10-CM

## 2021-02-23 DIAGNOSIS — Z1211 Encounter for screening for malignant neoplasm of colon: Secondary | ICD-10-CM | POA: Insufficient documentation

## 2021-02-23 LAB — MICROSCOPIC EXAMINATION: RBC, Urine: NONE SEEN /hpf (ref 0–2)

## 2021-02-23 LAB — CBC WITH DIFFERENTIAL/PLATELET
Hematocrit: 42.9 % (ref 34.0–46.6)
Hemoglobin: 14.5 g/dL (ref 11.1–15.9)
Lymphocytes Absolute: 2.6 10*3/uL (ref 0.7–3.1)
Lymphs: 26 %
MCH: 29 pg (ref 26.6–33.0)
MCHC: 33.8 g/dL (ref 31.5–35.7)
MCV: 86 fL (ref 79–97)
MID (Absolute): 0.6 10*3/uL (ref 0.1–1.6)
MID: 6 %
Neutrophils Absolute: 6.8 10*3/uL (ref 1.4–7.0)
Neutrophils: 68 %
Platelets: 265 10*3/uL (ref 150–450)
RBC: 5 x10E6/uL (ref 3.77–5.28)
RDW: 15.2 % (ref 11.7–15.4)
WBC: 10 10*3/uL (ref 3.4–10.8)

## 2021-02-23 LAB — URINALYSIS, ROUTINE W REFLEX MICROSCOPIC
Bilirubin, UA: NEGATIVE
Glucose, UA: NEGATIVE
Ketones, UA: NEGATIVE
Nitrite, UA: NEGATIVE
Protein,UA: NEGATIVE
RBC, UA: NEGATIVE
Specific Gravity, UA: 1.015 (ref 1.005–1.030)
Urobilinogen, Ur: 0.2 mg/dL (ref 0.2–1.0)
pH, UA: 5 (ref 5.0–7.5)

## 2021-02-23 LAB — BAYER DCA HB A1C WAIVED: HB A1C (BAYER DCA - WAIVED): 5.3 % (ref 4.8–5.6)

## 2021-02-23 NOTE — Patient Instructions (Signed)
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
## 2021-02-24 ENCOUNTER — Telehealth: Payer: Self-pay

## 2021-02-24 NOTE — Chronic Care Management (AMB) (Signed)
  Care Management   Outreach Note  02/24/2021 Name: Virginia West MRN: [id number] DOB: [birth year]  Referred by: Loura Pardon, MD Reason for referral : Care Coordination (Outreach to schedule referral with LCSW )   An unsuccessful telephone outreach was attempted today. The patient was referred to the case management team for assistance with care management and care coordination.   Follow Up Plan:  A HIPAA compliant phone message was left for the patient providing contact information and requesting a return call.  The care management team will reach out to the patient again over the next 3 days.  If patient returns call to provider office, please advise to call Embedded Care Management Care Guide Penne Lash  at [phone]  Penne Lash, RMA Care Guide, Embedded Care Coordination [facility]  [place], [place] [address] Direct Dial: [phone] [email or web address]

## 2021-02-24 NOTE — Telephone Encounter (Signed)
CALLED PATIENT NO ANSWER LEFT VOICEMAIL FOR A CALL BACK ? ?

## 2021-02-24 NOTE — Progress Notes (Signed)
Pt need vit b12 injecitons  once a month starting this week please let her know , needs an appt.

## 2021-02-25 ENCOUNTER — Other Ambulatory Visit (INDEPENDENT_AMBULATORY_CARE_PROVIDER_SITE_OTHER): Payer: Self-pay

## 2021-02-25 DIAGNOSIS — Z1211 Encounter for screening for malignant neoplasm of colon: Secondary | ICD-10-CM

## 2021-02-25 NOTE — Progress Notes (Signed)
Gastroenterology Pre-Procedure Review  Request Date: 03/24/2021 Requesting Physician: Dr.  Allegra Lai  PATIENT REVIEW QUESTIONS: The patient responded to the following health history questions as indicated:    1. Are you having any GI issues? no 2. Do you have a personal history of Polyps? no 3. Do you have a family history of Colon Cancer or Polyps? no 4. Diabetes Mellitus? no 5. Joint replacements in the past 12 months?no 6. Major health problems in the past 3 months?no 7. Any artificial heart valves, MVP, or defibrillator?no    MEDICATIONS & ALLERGIES:    Patient reports the following regarding taking any anticoagulation/antiplatelet therapy:   Plavix, Coumadin, Eliquis, Xarelto, Lovenox, Pradaxa, Brilinta, or Effient? no Aspirin? no  Patient confirms/reports the following medications:  No current outpatient medications on file.   No current facility-administered medications for this visit.    Patient confirms/reports the following allergies:  No Known Allergies  No orders of the defined types were placed in this encounter.   AUTHORIZATION INFORMATION Primary Insurance: 1D#: Group #:  Secondary Insurance: 1D#: Group #:  SCHEDULE INFORMATION: Date:03/24/2021  Time: Location: [facility]

## 2021-02-25 NOTE — Progress Notes (Signed)
Gave patient a sample of clenpiq her insurance didn't cover any of the preps she is coming to pick up from office

## 2021-03-01 NOTE — Chronic Care Management (AMB) (Signed)
  Care Management   Note  03/01/2021 Name: Niti Leisure MRN: [id number] DOB: 01/19/1946  Brynley Cuddeback is a 75 y.o. year old female who is a primary care patient of Vigg, Avanti, MD. I reached out to [name] by phone today in response to a referral sent by Ms. Edwena Blow primary care provider.   Ms. Raigoza was given information about care management services today including:  Care management services include personalized support from designated clinical staff supervised by her physician, including individualized plan of care and coordination with other care providers 24/7 contact phone numbers for assistance for urgent and routine care needs. The patient may stop care management services at any time by phone call to the office staff.  Patient agreed to services and verbal consent obtained.   Follow up plan: Telephone appointment with care management team member scheduled for:03/05/2021  Penne Lash, RMA Care Guide, Embedded Care Coordination [facility]  [place], [place] [address] Direct Dial: [phone] [email or web address]

## 2021-03-02 LAB — TSH: TSH: 0.996 u[IU]/mL (ref 0.450–4.500)

## 2021-03-02 LAB — CBC WITH DIFFERENTIAL/PLATELET
Basophils Absolute: 0.1 10*3/uL (ref 0.0–0.2)
Basos: 1 %
EOS (ABSOLUTE): 0.4 10*3/uL (ref 0.0–0.4)
Eos: 4 %
Hematocrit: 42.6 % (ref 34.0–46.6)
Hemoglobin: 14.1 g/dL (ref 11.1–15.9)
Immature Grans (Abs): 0 10*3/uL (ref 0.0–0.1)
Immature Granulocytes: 0 %
Lymphocytes Absolute: 2.5 10*3/uL (ref 0.7–3.1)
Lymphs: 27 %
MCH: 28.7 pg (ref 26.6–33.0)
MCHC: 33.1 g/dL (ref 31.5–35.7)
MCV: 87 fL (ref 79–97)
Monocytes Absolute: 0.7 10*3/uL (ref 0.1–0.9)
Monocytes: 7 %
Neutrophils Absolute: 5.7 10*3/uL (ref 1.4–7.0)
Neutrophils: 61 %
Platelets: 287 10*3/uL (ref 150–450)
RBC: 4.92 x10E6/uL (ref 3.77–5.28)
RDW: 14.1 % (ref 11.7–15.4)
WBC: 9.5 10*3/uL (ref 3.4–10.8)

## 2021-03-02 LAB — LIPID PANEL
Chol/HDL Ratio: 3.7 ratio (ref 0.0–4.4)
Cholesterol, Total: 222 mg/dL — ABNORMAL HIGH (ref 100–199)
HDL: 60 mg/dL (ref >39)
LDL Chol Calc (NIH): 126 mg/dL — ABNORMAL HIGH (ref 0–99)
Triglycerides: 204 mg/dL — ABNORMAL HIGH (ref 0–149)
VLDL Cholesterol Cal: 36 mg/dL (ref 5–40)

## 2021-03-02 LAB — COMPREHENSIVE METABOLIC PANEL
ALT: 13 IU/L (ref 0–32)
AST: 21 IU/L (ref 0–40)
Albumin/Globulin Ratio: 1.4 (ref 1.2–2.2)
Albumin: 4.8 g/dL — ABNORMAL HIGH (ref 3.7–4.7)
Alkaline Phosphatase: 83 IU/L (ref 44–121)
BUN/Creatinine Ratio: 15 (ref 12–28)
BUN: 18 mg/dL (ref 8–27)
Bilirubin Total: 0.2 mg/dL (ref 0.0–1.2)
CO2: 18 mmol/L — ABNORMAL LOW (ref 20–29)
Calcium: 10.1 mg/dL (ref 8.7–10.3)
Chloride: 100 mmol/L (ref 96–106)
Creatinine, Ser: 1.17 mg/dL — ABNORMAL HIGH (ref 0.57–1.00)
Globulin, Total: 3.5 g/dL (ref 1.5–4.5)
Glucose: 82 mg/dL (ref 70–99)
Potassium: 4.5 mmol/L (ref 3.5–5.2)
Sodium: 138 mmol/L (ref 134–144)
Total Protein: 8.3 g/dL (ref 6.0–8.5)
eGFR: 49 mL/min/{1.73_m2} — ABNORMAL LOW (ref >59)

## 2021-03-02 LAB — VITAMIN D 1,25 DIHYDROXY
Vitamin D 1, 25 (OH)2 Total: 35 pg/mL
Vitamin D2 1, 25 (OH)2: <10 pg/mL
Vitamin D3 1, 25 (OH)2: 32 pg/mL

## 2021-03-02 LAB — VITAMIN B12: Vitamin B-12: 228 pg/mL — ABNORMAL LOW (ref 232–1245)

## 2021-03-02 NOTE — Progress Notes (Signed)
Thank you :)

## 2021-03-02 NOTE — Progress Notes (Signed)
Pt is scheduled for b12 shot 10/4

## 2021-03-04 ENCOUNTER — Other Ambulatory Visit: Payer: Self-pay

## 2021-03-04 ENCOUNTER — Ambulatory Visit (INDEPENDENT_AMBULATORY_CARE_PROVIDER_SITE_OTHER): Payer: Federal, State, Local not specified - PPO

## 2021-03-04 ENCOUNTER — Telehealth: Payer: Self-pay | Admitting: *Deleted

## 2021-03-04 DIAGNOSIS — E538 Deficiency of other specified B group vitamins: Secondary | ICD-10-CM

## 2021-03-04 MED ORDER — CYANOCOBALAMIN 1000 MCG/ML IJ SOLN
1000.0000 ug | Freq: Once | INTRAMUSCULAR | Status: AC
  Administered 2021-03-04: 1000 ug via INTRAMUSCULAR

## 2021-03-04 NOTE — Chronic Care Management (AMB) (Signed)
  Care Management   Note  03/04/2021 Name: Colton Engdahl MRN: [id number] DOB: Sep 23, 1945  Virginia West is a 75 y.o. year old female who is a primary care patient of Vigg, Avanti, MD and is actively engaged with the care management team. I reached out to [name] by phone today to assist with re-scheduling an initial visit with the Licensed Clinical Social Worker  Follow up plan: Unsuccessful telephone outreach attempt made. A HIPAA compliant phone message was left for the patient providing contact information and requesting a return call.  The care management team will reach out to the patient again over the next 7 days.  If patient returns call to provider office, please advise to call Embedded Care Management Care Guide Misty Stanley at [phone]  [facility] Guide, Embedded Care Coordination [facility]  Care Management  Direct Dial: [phone]

## 2021-03-04 NOTE — Progress Notes (Signed)
Patient presents today for a B-12 injection, received in left deltoid, patient tolerated well.

## 2021-03-05 ENCOUNTER — Telehealth: Payer: Federal, State, Local not specified - PPO

## 2021-03-10 NOTE — Chronic Care Management (AMB) (Signed)
  Care Management   Note  03/10/2021 Name: Virginia West MRN: [id number] DOB: 20-Jul-1945  [name] is a 75 y.o. year old female who is a primary care patient of Vigg, Avanti, MD and is actively engaged with the care management team. I reached out to [name] by phone today to assist with re-scheduling an initial visit with the Licensed Clinical Social Worker  Follow up plan: Telephone appointment with care management team member scheduled for:03/19/21  Gwenevere Ghazi  Care Guide, Embedded Care Coordination [facility] [place] [facility]  Care Management  Direct Dial: [phone]

## 2021-03-16 ENCOUNTER — Ambulatory Visit: Payer: Federal, State, Local not specified - PPO | Admitting: Internal Medicine

## 2021-03-16 ENCOUNTER — Encounter: Payer: Self-pay | Admitting: Internal Medicine

## 2021-03-16 ENCOUNTER — Other Ambulatory Visit: Payer: Self-pay

## 2021-03-16 VITALS — BP 130/70 | HR 63 | Temp 98.2°F | Ht 63.39 in | Wt 142.6 lb

## 2021-03-16 DIAGNOSIS — R7989 Other specified abnormal findings of blood chemistry: Secondary | ICD-10-CM | POA: Diagnosis not present

## 2021-03-16 DIAGNOSIS — N3941 Urge incontinence: Secondary | ICD-10-CM | POA: Diagnosis not present

## 2021-03-16 MED ORDER — OXYBUTYNIN CHLORIDE 5 MG PO TABS: 5.0000 mg | ORAL_TABLET | Freq: Two times a day (BID) | ORAL | 2 refills | Status: DC

## 2021-03-16 NOTE — Patient Instructions (Signed)

## 2021-03-16 NOTE — Progress Notes (Signed)
BP 130/70   Pulse 63   Temp 98.2 F (36.8 C) (Oral)   Ht 5' 3.39" (1.61 m)   Wt 142 lb 9.6 oz (64.7 kg)   SpO2 97%   BMI 24.95 kg/m    Subjective:    Patient ID: Virginia West, female    DOB: 1945/08/25, 75 y.o.   MRN: [id number]  Chief Complaint  Patient presents with   Labwork    HPI: Virginia West is a 75 y.o. female  Pt is here for a fu , she says she has scheduled her cscope, congratulated her on this, hasnt ever had one of these yet.  Has had low b12 levels at 228   Urinary Frequency  Associated symptoms include frequency.  Neurologic Problem The patient's pertinent negatives include no altered mental status, clumsiness, focal sensory loss, focal weakness, loss of balance, memory loss, near-syncope or slurred speech. Primary symptoms comment: low b12 levels ho neuropathy sec to such.   Chief Complaint  Patient presents with   Labwork    Relevant past medical, surgical, family and social history reviewed and updated as indicated. Interim medical history since our last visit reviewed. Allergies and medications reviewed and updated.  Review of Systems  Cardiovascular:  Negative for near-syncope.  Genitourinary:  Positive for frequency.  Neurological:  Negative for focal weakness and loss of balance.  Psychiatric/Behavioral:  Negative for memory loss.    Per HPI unless specifically indicated above     Objective:    BP 130/70   Pulse 63   Temp 98.2 F (36.8 C) (Oral)   Ht 5' 3.39" (1.61 m)   Wt 142 lb 9.6 oz (64.7 kg)   SpO2 97%   BMI 24.95 kg/m   Wt Readings from Last 3 Encounters:  03/16/21 142 lb 9.6 oz (64.7 kg)  02/23/21 145 lb (65.8 kg)    Physical Exam Vitals and nursing note reviewed.  Constitutional:      General: She is not in acute distress.    Appearance: Normal appearance. She is not ill-appearing or diaphoretic.  Eyes:     Conjunctiva/sclera: Conjunctivae normal.  Pulmonary:     Breath sounds: No rhonchi.  Abdominal:     General:  Abdomen is flat. Bowel sounds are normal. There is no distension.     Palpations: Abdomen is soft. There is no mass.     Tenderness: There is no abdominal tenderness. There is no guarding.  Skin:    General: Skin is warm and dry.     Coloration: Skin is not jaundiced.     Findings: No erythema.  Neurological:     Mental Status: She is alert.   Results for orders placed or performed in visit on 02/23/21  Microscopic Examination   BLD  Result Value Ref Range   WBC, UA 0-5 0 - 5 /hpf   RBC None seen 0 - 2 /hpf   Epithelial Cells (non renal) 0-10 0 - 10 /hpf   Mucus, UA Present (A) Not Estab.   Bacteria, UA Few (A) None seen/Few  Vitamin B12  Result Value Ref Range   Vitamin B-12 228 (L) 232 - 1,245 pg/mL  CBC with Differential/Platelet  Result Value Ref Range   WBC 9.5 3.4 - 10.8 x10E3/uL   RBC 4.92 3.77 - 5.28 x10E6/uL   Hemoglobin 14.1 11.1 - 15.9 g/dL   Hematocrit 42.6 34.0 - 46.6 %   MCV 87 79 - 97 fL   MCH 28.7 26.6 - 33.0 pg  MCHC 33.1 31.5 - 35.7 g/dL   RDW 14.1 11.7 - 15.4 %   Platelets 287 150 - 450 x10E3/uL   Neutrophils 61 Not Estab. %   Lymphs 27 Not Estab. %   Monocytes 7 Not Estab. %   Eos 4 Not Estab. %   Basos 1 Not Estab. %   Neutrophils Absolute 5.7 1.4 - 7.0 x10E3/uL   Lymphocytes Absolute 2.5 0.7 - 3.1 x10E3/uL   Monocytes Absolute 0.7 0.1 - 0.9 x10E3/uL   EOS (ABSOLUTE) 0.4 0.0 - 0.4 x10E3/uL   Basophils Absolute 0.1 0.0 - 0.2 x10E3/uL   Immature Granulocytes 0 Not Estab. %   Immature Grans (Abs) 0.0 0.0 - 0.1 x10E3/uL  Comprehensive metabolic panel  Result Value Ref Range   Glucose 82 70 - 99 mg/dL   BUN 18 8 - 27 mg/dL   Creatinine, Ser 1.17 (H) 0.57 - 1.00 mg/dL   eGFR 49 (L) >59 mL/min/1.73   BUN/Creatinine Ratio 15 12 - 28   Sodium 138 134 - 144 mmol/L   Potassium 4.5 3.5 - 5.2 mmol/L   Chloride 100 96 - 106 mmol/L   CO2 18 (L) 20 - 29 mmol/L   Calcium 10.1 8.7 - 10.3 mg/dL   Total Protein 8.3 6.0 - 8.5 g/dL   Albumin 4.8 (H) 3.7 - 4.7  g/dL   Globulin, Total 3.5 1.5 - 4.5 g/dL   Albumin/Globulin Ratio 1.4 1.2 - 2.2   Bilirubin Total 0.2 0.0 - 1.2 mg/dL   Alkaline Phosphatase 83 44 - 121 IU/L   AST 21 0 - 40 IU/L   ALT 13 0 - 32 IU/L  Lipid panel  Result Value Ref Range   Cholesterol, Total 222 (H) 100 - 199 mg/dL   Triglycerides 204 (H) 0 - 149 mg/dL   HDL 60 >39 mg/dL   VLDL Cholesterol Cal 36 5 - 40 mg/dL   LDL Chol Calc (NIH) 126 (H) 0 - 99 mg/dL   Chol/HDL Ratio 3.7 0.0 - 4.4 ratio  TSH  Result Value Ref Range   TSH 0.996 0.450 - 4.500 uIU/mL  Vitamin D 1,25 dihydroxy  Result Value Ref Range   Vitamin D 1, 25 (OH)2 Total 35 pg/mL   Vitamin D2 1, 25 (OH)2 <10 pg/mL   Vitamin D3 1, 25 (OH)2 32 pg/mL  Bayer DCA Hb A1c Waived (STAT)  Result Value Ref Range   HB A1C (BAYER DCA - WAIVED) 5.3 4.8 - 5.6 %  CBC With Differential/Platelet  Result Value Ref Range   WBC 10.0 3.4 - 10.8 x10E3/uL   RBC 5.00 3.77 - 5.28 x10E6/uL   Hemoglobin 14.5 11.1 - 15.9 g/dL   Hematocrit 42.9 34.0 - 46.6 %   MCV 86 79 - 97 fL   MCH 29.0 26.6 - 33.0 pg   MCHC 33.8 31.5 - 35.7 g/dL   RDW 15.2 11.7 - 15.4 %   Platelets 265 150 - 450 x10E3/uL   Neutrophils 68 Not Estab. %   Lymphs 26 Not Estab. %   MID 6 Not Estab. %   Neutrophils Absolute 6.8 1.4 - 7.0 x10E3/uL   Lymphocytes Absolute 2.6 0.7 - 3.1 x10E3/uL   MID (Absolute) 0.6 0.1 - 1.6 X10E3/uL  Urinalysis, Routine w reflex microscopic  Result Value Ref Range   Specific Gravity, UA 1.015 1.005 - 1.030   pH, UA 5.0 5.0 - 7.5   Color, UA Yellow Yellow   Appearance Ur Clear Clear   Leukocytes,UA 1+ (A) Negative  Protein,UA Negative Negative/Trace   Glucose, UA Negative Negative   Ketones, UA Negative Negative   RBC, UA Negative Negative   Bilirubin, UA Negative Negative   Urobilinogen, Ur 0.2 0.2 - 1.0 mg/dL   Nitrite, UA Negative Negative   Microscopic Examination See below:         Current Outpatient Medications:    oxybutynin (DITROPAN) 5 MG tablet, Take 1  tablet (5 mg total) by mouth 2 (two) times daily., Disp: 60 tablet, Rfl: 2   timolol (TIMOPTIC) 0.5 % ophthalmic solution, SMARTSIG:In Eye(s), Disp: , Rfl:     Assessment & Plan:  Glaucoma is on timolol for such per ophthalmology stable, chronic  B12 deficinecy ? Sec to diet , saw a neurologist in the past and was given injection for such , [place] ? Pernicious anemia  Continue b12 injections - needs this every 1 month x 6 months till rechecked.   HLD is not onmeds, does water aerobics 3 times a week.    Ref. Range 02/23/2021 11:30  Total CHOL/HDL Ratio Latest Ref Range: 0.0 - 4.4 ratio 3.7  Cholesterol, Total Latest Ref Range: 100 - 199 mg/dL 222 (H)  HDL Cholesterol Latest Ref Range: >39 mg/dL 60  Triglycerides Latest Ref Range: 0 - 149 mg/dL 204 (H)  VLDL Cholesterol Cal Latest Ref Range: 5 - 40 mg/dL 36  LDL Chol Calc (NIH) Latest Ref Range: 0 - 99 mg/dL 126 (H)    4. Urinary incontinenc.  Will start pt on oxybutynin  Is to fu with obgyn for pelvic exam.  Problem List Items Addressed This Visit       Other   Urge incontinence of urine - Primary   Relevant Medications   oxybutynin (DITROPAN) 5 MG tablet   Other Relevant Orders   Ambulatory referral to Urology   Elevated serum creatinine   Relevant Orders   Comprehensive metabolic panel     Orders Placed This Encounter  Procedures   Comprehensive metabolic panel   Ambulatory referral to Urology     Meds ordered this encounter  Medications   oxybutynin (DITROPAN) 5 MG tablet    Sig: Take 1 tablet (5 mg total) by mouth 2 (two) times daily.    Dispense:  60 tablet    Refill:  2     Follow up plan: Return in about 6 weeks (around 04/27/2021).

## 2021-03-19 ENCOUNTER — Ambulatory Visit: Payer: Federal, State, Local not specified - PPO | Admitting: Licensed Clinical Social Worker

## 2021-03-23 ENCOUNTER — Ambulatory Visit: Payer: Federal, State, Local not specified - PPO | Admitting: Urology

## 2021-03-24 ENCOUNTER — Ambulatory Visit
Admission: RE | Admit: 2021-03-24 | Discharge: 2021-03-24 | Disposition: A | Discharge status: Home / Self Care | Payer: Federal, State, Local not specified - PPO | Attending: Gastroenterology | Admitting: Gastroenterology

## 2021-03-24 ENCOUNTER — Encounter
Admission: RE | Disposition: A | Discharge status: Home / Self Care | Payer: Self-pay | Source: Home / Self Care | Attending: Gastroenterology

## 2021-03-24 ENCOUNTER — Encounter: Payer: Self-pay | Admitting: Gastroenterology

## 2021-03-24 ENCOUNTER — Ambulatory Visit: Payer: Federal, State, Local not specified - PPO | Admitting: Anesthesiology

## 2021-03-24 DIAGNOSIS — Z1211 Encounter for screening for malignant neoplasm of colon: Secondary | ICD-10-CM

## 2021-03-24 DIAGNOSIS — K644 Residual hemorrhoidal skin tags: Secondary | ICD-10-CM | POA: Insufficient documentation

## 2021-03-24 HISTORY — PX: COLONOSCOPY WITH PROPOFOL: SHX5780

## 2021-03-24 SURGERY — COLONOSCOPY WITH PROPOFOL
Anesthesia: General

## 2021-03-24 MED ORDER — PROPOFOL 500 MG/50ML IV EMUL
INTRAVENOUS | Status: DC | PRN
  Administered 2021-03-24: 150 ug/kg/min via INTRAVENOUS

## 2021-03-24 MED ORDER — SODIUM CHLORIDE 0.9 % IV SOLN
INTRAVENOUS | Status: DC
  Administered 2021-03-24: 1000 mL via INTRAVENOUS

## 2021-03-24 MED ORDER — PROPOFOL 10 MG/ML IV BOLUS
INTRAVENOUS | Status: DC | PRN
  Administered 2021-03-24: 60 mg via INTRAVENOUS

## 2021-03-24 MED ORDER — LIDOCAINE HCL (CARDIAC) PF 100 MG/5ML IV SOSY
PREFILLED_SYRINGE | INTRAVENOUS | Status: DC | PRN
  Administered 2021-03-24: 50 mg via INTRAVENOUS

## 2021-03-24 NOTE — Anesthesia Preprocedure Evaluation (Addendum)
Anesthesia Evaluation  Patient identified by MRN, date of birth, ID band Patient awake    Reviewed: Allergy & Precautions, NPO status , Patient's Chart, lab work & pertinent test results  Airway Mallampati: II  TM Distance: >3 FB Neck ROM: full    Dental no notable dental hx.    Pulmonary asthma ,    Pulmonary exam normal        Cardiovascular negative cardio ROS Normal cardiovascular exam     Neuro/Psych PSYCHIATRIC DISORDERS Anxiety  Neuromuscular disease (Neuropathy)    GI/Hepatic negative GI ROS, Neg liver ROS,   Endo/Other  negative endocrine ROS  Renal/GU Renal disease (elevated creatinine)  negative genitourinary   Musculoskeletal   Abdominal Normal abdominal exam  (+)   Peds  Hematology negative hematology ROS (+)   Anesthesia Other Findings Past Medical History: No date: Asthma No date: Pancreatitis PTSD (post-traumatic stress disorder) Vitamin D deficiency Neuropathy B12 deficiency Glaucoma     Past Surgical History: No date: ABDOMINAL HYSTERECTOMY No date: APPENDECTOMY No date: CHOLECYSTECTOMY     Reproductive/Obstetrics negative OB ROS                            Anesthesia Physical Anesthesia Plan  ASA: 2  Anesthesia Plan: General   Post-op Pain Management:    Induction:   PONV Risk Score and Plan: Propofol infusion and TIVA  Airway Management Planned: Natural Airway and Nasal Cannula  Additional Equipment:   Intra-op Plan:   Post-operative Plan:   Informed Consent: I have reviewed the patients History and Physical, chart, labs and discussed the procedure including the risks, benefits and alternatives for the proposed anesthesia with the patient or authorized representative who has indicated his/her understanding and acceptance.     Dental advisory given  Plan Discussed with: Anesthesiologist, CRNA and Surgeon  Anesthesia Plan Comments:         Anesthesia Quick Evaluation

## 2021-03-24 NOTE — H&P (Signed)
  Arlyss Repress, MD [address]  [address]  [place], [place] [address]  Main: [phone]  Fax: [phone] Pager: [phone]  Primary Care Physician:  Loura Pardon, MD Primary Gastroenterologist:  Dr. Arlyss Repress  Pre-Procedure History & Physical: HPI:  Virginia West is a 75 y.o. female is here for an colonoscopy.   Past Medical History:  Diagnosis Date   Asthma    Pancreatitis     Past Surgical History:  Procedure Laterality Date   ABDOMINAL HYSTERECTOMY     APPENDECTOMY     CHOLECYSTECTOMY      Prior to Admission medications   Medication Sig Start Date End Date Taking? Authorizing Provider  oxybutynin (DITROPAN) 5 MG tablet Take 1 tablet (5 mg total) by mouth 2 (two) times daily. 03/16/21  Yes Vigg, Avanti, MD  timolol (TIMOPTIC) 0.5 % ophthalmic solution SMARTSIG:In Eye(s) 03/15/21  Yes [provider]    Allergies as of 02/25/2021   (No Known Allergies)    Family History  Problem Relation Age of Onset   Heart disease Mother    Heart disease Father    Cancer Brother     Social History   Socioeconomic History   Marital status: Married    Spouse name: Not on file   Number of children: Not on file   Years of education: Not on file   Highest education level: Not on file  Occupational History   Not on file  Tobacco Use   Smoking status: Never   Smokeless tobacco: Never  Vaping Use   Vaping Use: Never used  Substance and Sexual Activity   Alcohol use: Not Currently   Drug use: Never   Sexual activity: Not Currently  Other Topics Concern   Not on file  Social History Narrative   Not on file   Social Determinants of Health   Financial Resource Strain: Not on file  Food Insecurity: Not on file  Transportation Needs: Not on file  Physical Activity: Not on file  Stress: Not on file  Social Connections: Not on file  Intimate Partner Violence: Not on file    Review of Systems: See HPI, otherwise negative ROS  Physical  Exam: BP (!) 150/54   Pulse (!) 115   Temp (!) 97.2 F (36.2 C) (Temporal)   Resp 16   Ht 5\' 2"  (1.575 m)   Wt 63.6 kg   SpO2 92%   BMI 25.66 kg/m  General:   Alert,  pleasant and cooperative in NAD Head:  Normocephalic and atraumatic. Neck:  Supple; no masses or thyromegaly. Lungs:  Clear throughout to auscultation.    Heart:  Regular rate and rhythm. Abdomen:  Soft, nontender and nondistended. Normal bowel sounds, without guarding, and without rebound.   Neurologic:  Alert and  oriented x4;  grossly normal neurologically.  Impression/Plan: Virginia West is here for an colonoscopy to be performed for colon cancer screening  Risks, benefits, limitations, and alternatives regarding  colonoscopy have been reviewed with the patient.  Questions have been answered.  All parties agreeable.   [name], MD  03/24/2021, 8:51 AM

## 2021-03-24 NOTE — Anesthesia Postprocedure Evaluation (Signed)
Anesthesia Post Note  Patient: Virginia West  Procedure(s) Performed: COLONOSCOPY WITH PROPOFOL  Patient location during evaluation: Endoscopy Anesthesia Type: General Level of consciousness: awake and alert Pain management: pain level controlled Vital Signs Assessment: post-procedure vital signs reviewed and stable Respiratory status: spontaneous breathing, nonlabored ventilation and respiratory function stable Cardiovascular status: blood pressure returned to baseline and stable Postop Assessment: no apparent nausea or vomiting Anesthetic complications: no   No notable events documented.   Last Vitals:  Vitals:   03/24/21 0930 03/24/21 0931  BP:  (!) 133/56  Pulse: 60 (!) 51  Resp:  16  Temp:    SpO2: 100% 100%    Last Pain:  Vitals:   03/24/21 0920  TempSrc: Temporal  PainSc:                  [name]

## 2021-03-24 NOTE — Op Note (Signed)
[facility]enter[facility] Patient Name: Virginia West Procedure Date: 03/24/2021 8:57 AM MRN: [id number] Account #: [id number] Date of Birth: 1946/05/16 Admit Type: Outpatient Age: 75 Room: [facility] ENDO ROOM 4 Gender: Female Note Status: Finalized Instrument Name: Nelda Marseille [id number] Procedure:             Colonoscopy Indications:           Screening for colorectal malignant neoplasm, This is                         the patient's first colonoscopy Providers:             Toney Reil MD, MD Referring MD:          Loura Pardon (Referring MD) Medicines:             General Anesthesia Complications:         No immediate complications. Estimated blood loss: None. Procedure:             Pre-Anesthesia Assessment:                        - Prior to the procedure, a History and Physical was                         performed, and patient medications and allergies were                         reviewed. The patient is competent. The risks and                         benefits of the procedure and the sedation options and                         risks were discussed with the patient. All questions                         were answered and informed consent was obtained.                         Patient identification and proposed procedure were                         verified by the physician, the nurse, the                         anesthesiologist, the anesthetist and the technician                         in the pre-procedure area in the procedure room in the                         endoscopy suite. Mental Status Examination: alert and                         oriented. Airway Examination: normal oropharyngeal                         airway and neck mobility. Respiratory Examination:  clear to auscultation. CV Examination: normal.                         Prophylactic Antibiotics: The patient does not require                         prophylactic  antibiotics. Prior Anticoagulants: The                         patient has taken no previous anticoagulant or                         antiplatelet agents. ASA Grade Assessment: II - A                         patient with mild systemic disease. After reviewing                         the risks and benefits, the patient was deemed in                         satisfactory condition to undergo the procedure. The                         anesthesia plan was to use general anesthesia.                         Immediately prior to administration of medications,                         the patient was re-assessed for adequacy to receive                         sedatives. The heart rate, respiratory rate, oxygen                         saturations, blood pressure, adequacy of pulmonary                         ventilation, and response to care were monitored                         throughout the procedure. The physical status of the                         patient was re-assessed after the procedure.                        After obtaining informed consent, the colonoscope was                         passed under direct vision. Throughout the procedure,                         the patient's blood pressure, pulse, and oxygen                         saturations were monitored continuously. The  Colonoscope was introduced through the anus and                         advanced to the the cecum, identified by appendiceal                         orifice and ileocecal valve. The colonoscopy was                         performed without difficulty. The patient tolerated                         the procedure well. The quality of the bowel                         preparation was evaluated using the BBPS [facility] Bowel                         Preparation Scale) with scores of: Right Colon = 3,                         Transverse Colon = 3 and Left Colon = 3 (entire mucosa                          seen well with no residual staining, small fragments                         of stool or opaque liquid). The total BBPS score                         equals 9. Findings:      The perianal and digital rectal examinations were normal. Pertinent       negatives include normal sphincter tone and no palpable rectal lesions.      The entire examined colon appeared normal.      Non-bleeding external hemorrhoids were found during retroflexion. The       hemorrhoids were small. Impression:            - The entire examined colon is normal.                        - Non-bleeding external hemorrhoids.                        - No specimens collected. Recommendation:        - Discharge patient to home (with escort).                        - Resume previous diet today.                        - Continue present medications.                        - Repeat colonoscopy in 10 years for screening                         purposes based on patient's medical condition at that  time. Procedure Code(s):     --- Professional ---                        I7124, Colorectal cancer screening; colonoscopy on                         individual not meeting criteria for high risk Diagnosis Code(s):     --- Professional ---                        Z12.11, Encounter for screening for malignant neoplasm                         of colon                        K64.4, Residual hemorrhoidal skin tags CPT copyright 2019 American Medical Association. All rights reserved. The codes documented in this report are preliminary and upon coder review may  be revised to meet current compliance requirements. Dr. Libby Maw Toney Reil MD, MD 03/24/2021 9:19:16 AM This report has been signed electronically. Number of Addenda: 0 Note Initiated On: 03/24/2021 8:57 AM Scope Withdrawal Time: 0 hours 5 minutes 53 seconds  Total Procedure Duration: 0 hours 10 minutes 50 seconds  Estimated Blood Loss:   Estimated blood loss: none.      [facility]
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
## 2021-03-24 NOTE — Patient Instructions (Signed)
Visit Information   Goals Addressed             This Visit's Progress    Management of Symptoms       Timeframe:  Long-Range Goal Priority:  High Start Date:     03/19/21                        Expected End Date:      05/29/21                 Follow Up Date 04/12/21   Patient Goals/Self-Care Activities: Over the next 120 days Increase coping skills and healthy habits Attend scheduled appointments with providers Utilize healthy coping skills discussed Contact PCP office with any questions or concerns        Patient verbalizes understanding of instructions provided today and agrees to view in MyChart.   Telephone follow up appointment with care management team member scheduled for:04/12/21  Jenel Lucks, MSW, LCSW [facility] Care Management [facility]  [facility] [email or web address] Phone [phone] 6:36 AM

## 2021-03-24 NOTE — Transfer of Care (Signed)
Immediate Anesthesia Transfer of Care Note  Patient: Virginia West  Procedure(s) Performed: COLONOSCOPY WITH PROPOFOL  Patient Location: Endoscopy Unit  Anesthesia Type:General  Level of Consciousness: awake, drowsy and patient cooperative  Airway & Oxygen Therapy: Patient Spontanous Breathing and Patient connected to face mask oxygen  Post-op Assessment: Report given to RN and Post -op Vital signs reviewed and stable  Post vital signs: Reviewed and stable  Last Vitals:  Vitals Value Taken Time  BP 125/52 03/24/21 0922  Temp    Pulse 60 03/24/21 0924  Resp 16 03/24/21 0924  SpO2 100 % 03/24/21 0924  Vitals shown include unvalidated device data.  Last Pain:  Vitals:   03/24/21 0829  TempSrc: Temporal  PainSc: 0-No pain         Complications: No notable events documented.

## 2021-03-25 ENCOUNTER — Encounter: Payer: Self-pay | Admitting: Gastroenterology

## 2021-03-30 ENCOUNTER — Ambulatory Visit: Payer: Federal, State, Local not specified - PPO | Admitting: Obstetrics and Gynecology

## 2021-03-30 ENCOUNTER — Other Ambulatory Visit: Payer: Self-pay

## 2021-03-30 ENCOUNTER — Encounter: Payer: Self-pay | Admitting: Obstetrics and Gynecology

## 2021-03-30 VITALS — BP 161/80 | HR 64 | Resp 16 | Ht 62.0 in | Wt 143.1 lb

## 2021-03-30 DIAGNOSIS — L900 Lichen sclerosus et atrophicus: Secondary | ICD-10-CM

## 2021-03-30 DIAGNOSIS — L292 Pruritus vulvae: Secondary | ICD-10-CM

## 2021-03-30 DIAGNOSIS — Z7689 Persons encountering health services in other specified circumstances: Secondary | ICD-10-CM

## 2021-03-30 DIAGNOSIS — Z1239 Encounter for other screening for malignant neoplasm of breast: Secondary | ICD-10-CM

## 2021-03-30 MED ORDER — CLOBETASOL PROPIONATE 0.05 % EX OINT: 1.0000 "application " | TOPICAL_OINTMENT | Freq: Two times a day (BID) | CUTANEOUS | 0 refills | Status: DC

## 2021-03-30 NOTE — Progress Notes (Signed)
HPI:      Virginia West is a 75 y.o. G3P0003 who LMP was No LMP recorded. Patient is postmenopausal.  Subjective:   She presents today because she has moved to the area recently and she was sent by her PCP for possible Pap, breast exam/mammography, and chronic vulvar itching. She has been using estrogen cream on the vulva but continues to experience itching and burning despite use. She has no family history of breast cancer and is due for her annual mammography. She had a hysterectomy 30 years ago for uterine fibroids and no prior history of abnormal cervical cytology. Of significant note, patient has lost greater than 50 pounds in the last year and a half after the death of her son.  She is seeing a Camera operator.  She is not currently taking any antidepressant medication.  She states that her weight has now stabilized over the last month.    Hx: The following portions of the patient's history were reviewed and updated as appropriate:             She  has a past medical history of Asthma and Pancreatitis. She does not have any pertinent problems on file. She  has a past surgical history that includes Abdominal hysterectomy; Appendectomy; Cholecystectomy; and Colonoscopy with propofol (N/A, 03/24/2021). Her family history includes Cancer in her brother; Heart disease in her father and mother. She  reports that she has never smoked. She has never used smokeless tobacco. She reports that she does not currently use alcohol. She reports that she does not use drugs. She has a current medication list which includes the following prescription(s): oxybutynin and timolol. She has No Known Allergies.       Review of Systems:  Review of Systems  Constitutional: Denied constitutional symptoms, night sweats, recent illness, fatigue, fever, insomnia and weight loss.  Eyes: Denied eye symptoms, eye pain, photophobia, vision change and visual disturbance.  Ears/Nose/Throat/Neck: Denied ear, nose,  throat or neck symptoms, hearing loss, nasal discharge, sinus congestion and sore throat.  Cardiovascular: Denied cardiovascular symptoms, arrhythmia, chest pain/pressure, edema, exercise intolerance, orthopnea and palpitations.  Respiratory: Denied pulmonary symptoms, asthma, pleuritic pain, productive sputum, cough, dyspnea and wheezing.  Gastrointestinal: Denied, gastro-esophageal reflux, melena, nausea and vomiting.  Genitourinary: See HPI for additional information.  Musculoskeletal: Denied musculoskeletal symptoms, stiffness, swelling, muscle weakness and myalgia.  Dermatologic: Denied dermatology symptoms, rash and scar.  Neurologic: Denied neurology symptoms, dizziness, headache, neck pain and syncope.  Psychiatric: Denied psychiatric symptoms, anxiety and depression.  Endocrine: Denied endocrine symptoms including hot flashes and night sweats.   Meds:   Current Outpatient Medications on File Prior to Visit  Medication Sig Dispense Refill   oxybutynin (DITROPAN) 5 MG tablet Take 1 tablet (5 mg total) by mouth 2 (two) times daily. 60 tablet 2   timolol (TIMOPTIC) 0.5 % ophthalmic solution SMARTSIG:In Eye(s)     No current facility-administered medications on file prior to visit.      Objective:     Vitals:   03/30/21 0957  BP: (!) 161/80  Pulse: 64  Resp: 16   Filed Weights   03/30/21 0957  Weight: 143 lb 1.6 oz (64.9 kg)              Breast examination:  Symmetrical, no nipple discharge, no axillary adenopathy, no masses noted.  Left nipple is inverted (patient states it has been like this for 15 years)  Vulvar examination::   Significant lichen sclerosus especially around anterior/clitoral hood.  It does however extend all the way down around the anus.          Assessment:    G3P0003 Patient Active Problem List   Diagnosis Date Noted   Colon cancer screening    Urge incontinence of urine 03/16/2021   Elevated serum creatinine 03/16/2021   PTSD  (post-traumatic stress disorder) 02/23/2021   Vitamin D deficiency 02/23/2021   Neuropathy 02/23/2021   B12 deficiency 02/23/2021   Screen for colon cancer 02/23/2021   Screening for osteoporosis 02/23/2021   Weight loss 02/23/2021   Encounter for screening for malignant neoplasm of breast 02/23/2021   Need for influenza vaccination 02/23/2021   Glaucoma 02/23/2021   Screening for cervical cancer 02/23/2021     1. Encounter to establish care   2. Vulvar itching   3. Lichen sclerosus   4. Encounter for special screening examination for neoplasm of breast     Itching likely secondary to lichen sclerosus. -Current treatment using topical estrogen not effective.   Plan:            1.  Change from estrogen use to clobetasol use.  Literature on lichen sclerosus given.  Plan follow-up in 6 weeks.  2.  Patient has no need for cervical cytology without a cervix and she has additionally aged out of this.  3.  Recommend mammography.  This may be her last mammogram as well.  (Age) Orders No orders of the defined types were placed in this encounter.   No orders of the defined types were placed in this encounter.     F/U  Return in about 6 weeks (around 05/11/2021). I spent 32 minutes involved in the care of this patient preparing to see the patient by obtaining and reviewing her medical history (including labs, imaging tests and prior procedures), documenting clinical information in the electronic health record (EHR), counseling and coordinating care plans, writing and sending prescriptions, ordering tests or procedures and in direct communicating with the patient and medical staff discussing pertinent items from her history and physical exam.  Finis Bud, M.D. 03/30/2021 10:29 AM

## 2021-04-07 ENCOUNTER — Ambulatory Visit (INDEPENDENT_AMBULATORY_CARE_PROVIDER_SITE_OTHER): Payer: Federal, State, Local not specified - PPO

## 2021-04-07 ENCOUNTER — Other Ambulatory Visit: Payer: Self-pay

## 2021-04-07 DIAGNOSIS — E538 Deficiency of other specified B group vitamins: Secondary | ICD-10-CM | POA: Diagnosis not present

## 2021-04-07 MED ORDER — CYANOCOBALAMIN 1000 MCG/ML IJ SOLN
1000.0000 ug | Freq: Once | INTRAMUSCULAR | Status: AC
  Administered 2021-04-07: 1000 ug via INTRAMUSCULAR

## 2021-04-12 ENCOUNTER — Ambulatory Visit: Payer: Federal, State, Local not specified - PPO | Admitting: Licensed Clinical Social Worker
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
## 2021-04-14 NOTE — Patient Instructions (Signed)
Visit Information  Patient Goals/Self-Care Activities: Over the next 120 days Increase coping skills and healthy habits Attend scheduled appointments with providers Utilize healthy coping skills discussed Contact PCP office with any questions or concerns  Patient verbalizes understanding of instructions provided today and agrees to view in MyChart.   Telephone follow up appointment with care management team member scheduled for:05/10/21  Jenel Lucks, MSW, LCSW [facility] Care Management [facility]  [facility] [email or web address] Phone [phone] 9:09 AM

## 2021-04-27 ENCOUNTER — Ambulatory Visit (INDEPENDENT_AMBULATORY_CARE_PROVIDER_SITE_OTHER): Payer: Federal, State, Local not specified - PPO | Admitting: Internal Medicine

## 2021-04-27 ENCOUNTER — Encounter: Payer: Self-pay | Admitting: Internal Medicine

## 2021-04-27 ENCOUNTER — Other Ambulatory Visit: Payer: Self-pay

## 2021-04-27 VITALS — BP 140/77 | HR 51 | Temp 97.8°F | Ht 61.81 in | Wt 142.2 lb

## 2021-04-27 DIAGNOSIS — F320 Major depressive disorder, single episode, mild: Secondary | ICD-10-CM | POA: Diagnosis not present

## 2021-04-27 DIAGNOSIS — Z636 Dependent relative needing care at home: Secondary | ICD-10-CM | POA: Diagnosis not present

## 2021-04-27 DIAGNOSIS — Z1231 Encounter for screening mammogram for malignant neoplasm of breast: Secondary | ICD-10-CM | POA: Diagnosis not present

## 2021-04-27 DIAGNOSIS — Z13820 Encounter for screening for osteoporosis: Secondary | ICD-10-CM | POA: Diagnosis not present

## 2021-04-27 MED ORDER — CITALOPRAM HYDROBROMIDE 10 MG PO TABS: 10.0000 mg | ORAL_TABLET | Freq: Every day | ORAL | 3 refills | Status: DC

## 2021-04-27 NOTE — Progress Notes (Signed)
 BP 140/77   Pulse (!) 51   Temp 97.8 F (36.6 C) (Oral)   Ht 5' 1.81" (1.57 m)   Wt 142 lb 3.2 oz (64.5 kg)   SpO2 96%   BMI 26.17 kg/m    Subjective:    Patient ID: Virginia West, female    DOB: 05/15/1946, 74 y.o.   MRN: [id number]  Chief Complaint  Patient presents with   Elevated creatinine    HPI: Virginia West is a 74 y.o. female  Incontinence is better since starting oxybutyin pees only 2 times a night, she was going every 2 hours.   Depression        This is a recurrent (2 yrs now getting worse, feels sad, cannot sleep , clark her husband  wont let him sleep) problem.  The current episode started more than 1 year ago.   Associated symptoms include decreased concentration, fatigue, helplessness, hopelessness, insomnia and sad.  Associated symptoms include not irritable, no restlessness, no decreased interest, no appetite change, no body aches, no myalgias, no headaches, no indigestion and no suicidal ideas.  Chief Complaint  Patient presents with   Elevated creatinine    Relevant past medical, surgical, family and social history reviewed and updated as indicated. Interim medical history since our last visit reviewed. Allergies and medications reviewed and updated.  Review of Systems  Constitutional:  Positive for fatigue. Negative for appetite change.  Musculoskeletal:  Negative for myalgias.  Neurological:  Negative for headaches.  Psychiatric/Behavioral:  Positive for decreased concentration and depression. Negative for suicidal ideas. The patient has insomnia.    Per HPI unless specifically indicated above     Objective:    BP 140/77   Pulse (!) 51   Temp 97.8 F (36.6 C) (Oral)   Ht 5' 1.81" (1.57 m)   Wt 142 lb 3.2 oz (64.5 kg)   SpO2 96%   BMI 26.17 kg/m   Wt Readings from Last 3 Encounters:  04/27/21 142 lb 3.2 oz (64.5 kg)  03/30/21 143 lb 1.6 oz (64.9 kg)  03/24/21 140 lb 4.5 oz (63.6 kg)    Physical Exam Vitals and nursing note reviewed.   Constitutional:      General: She is not irritable.She is not in acute distress.    Appearance: Normal appearance. She is not ill-appearing or diaphoretic.  Pulmonary:     Breath sounds: No rhonchi.  Skin:    General: Skin is warm and dry.     Coloration: Skin is not jaundiced.     Findings: No erythema.  Neurological:     Mental Status: She is alert.  Psychiatric:     Comments: Is sad and tearful during this appointment.     Results for orders placed or performed in visit on 02/23/21  Microscopic Examination   BLD  Result Value Ref Range   WBC, UA 0-5 0 - 5 /hpf   RBC None seen 0 - 2 /hpf   Epithelial Cells (non renal) 0-10 0 - 10 /hpf   Mucus, UA Present (A) Not Estab.   Bacteria, UA Few (A) None seen/Few  Vitamin B12  Result Value Ref Range   Vitamin B-12 228 (L) 232 - 1,245 pg/mL  CBC with Differential/Platelet  Result Value Ref Range   WBC 9.5 3.4 - 10.8 x10E3/uL   RBC 4.92 3.77 - 5.28 x10E6/uL   Hemoglobin 14.1 11.1 - 15.9 g/dL   Hematocrit 42.6 34.0 - 46.6 %   MCV 87 79 - 97 fL     MCH 28.7 26.6 - 33.0 pg   MCHC 33.1 31.5 - 35.7 g/dL   RDW 14.1 11.7 - 15.4 %   Platelets 287 150 - 450 x10E3/uL   Neutrophils 61 Not Estab. %   Lymphs 27 Not Estab. %   Monocytes 7 Not Estab. %   Eos 4 Not Estab. %   Basos 1 Not Estab. %   Neutrophils Absolute 5.7 1.4 - 7.0 x10E3/uL   Lymphocytes Absolute 2.5 0.7 - 3.1 x10E3/uL   Monocytes Absolute 0.7 0.1 - 0.9 x10E3/uL   EOS (ABSOLUTE) 0.4 0.0 - 0.4 x10E3/uL   Basophils Absolute 0.1 0.0 - 0.2 x10E3/uL   Immature Granulocytes 0 Not Estab. %   Immature Grans (Abs) 0.0 0.0 - 0.1 x10E3/uL  Comprehensive metabolic panel  Result Value Ref Range   Glucose 82 70 - 99 mg/dL   BUN 18 8 - 27 mg/dL   Creatinine, Ser 1.17 (H) 0.57 - 1.00 mg/dL   eGFR 49 (L) >59 mL/min/1.73   BUN/Creatinine Ratio 15 12 - 28   Sodium 138 134 - 144 mmol/L   Potassium 4.5 3.5 - 5.2 mmol/L   Chloride 100 96 - 106 mmol/L   CO2 18 (L) 20 - 29 mmol/L    Calcium 10.1 8.7 - 10.3 mg/dL   Total Protein 8.3 6.0 - 8.5 g/dL   Albumin 4.8 (H) 3.7 - 4.7 g/dL   Globulin, Total 3.5 1.5 - 4.5 g/dL   Albumin/Globulin Ratio 1.4 1.2 - 2.2   Bilirubin Total 0.2 0.0 - 1.2 mg/dL   Alkaline Phosphatase 83 44 - 121 IU/L   AST 21 0 - 40 IU/L   ALT 13 0 - 32 IU/L  Lipid panel  Result Value Ref Range   Cholesterol, Total 222 (H) 100 - 199 mg/dL   Triglycerides 204 (H) 0 - 149 mg/dL   HDL 60 >39 mg/dL   VLDL Cholesterol Cal 36 5 - 40 mg/dL   LDL Chol Calc (NIH) 126 (H) 0 - 99 mg/dL   Chol/HDL Ratio 3.7 0.0 - 4.4 ratio  TSH  Result Value Ref Range   TSH 0.996 0.450 - 4.500 uIU/mL  Vitamin D 1,25 dihydroxy  Result Value Ref Range   Vitamin D 1, 25 (OH)2 Total 35 pg/mL   Vitamin D2 1, 25 (OH)2 <10 pg/mL   Vitamin D3 1, 25 (OH)2 32 pg/mL  Bayer DCA Hb A1c Waived (STAT)  Result Value Ref Range   HB A1C (BAYER DCA - WAIVED) 5.3 4.8 - 5.6 %  CBC With Differential/Platelet  Result Value Ref Range   WBC 10.0 3.4 - 10.8 x10E3/uL   RBC 5.00 3.77 - 5.28 x10E6/uL   Hemoglobin 14.5 11.1 - 15.9 g/dL   Hematocrit 42.9 34.0 - 46.6 %   MCV 86 79 - 97 fL   MCH 29.0 26.6 - 33.0 pg   MCHC 33.8 31.5 - 35.7 g/dL   RDW 15.2 11.7 - 15.4 %   Platelets 265 150 - 450 x10E3/uL   Neutrophils 68 Not Estab. %   Lymphs 26 Not Estab. %   MID 6 Not Estab. %   Neutrophils Absolute 6.8 1.4 - 7.0 x10E3/uL   Lymphocytes Absolute 2.6 0.7 - 3.1 x10E3/uL   MID (Absolute) 0.6 0.1 - 1.6 X10E3/uL  Urinalysis, Routine w reflex microscopic  Result Value Ref Range   Specific Gravity, UA 1.015 1.005 - 1.030   pH, UA 5.0 5.0 - 7.5   Color, UA Yellow Yellow   Appearance Ur Clear Clear  Leukocytes,UA 1+ (A) Negative   Protein,UA Negative Negative/Trace   Glucose, UA Negative Negative   Ketones, UA Negative Negative   RBC, UA Negative Negative   Bilirubin, UA Negative Negative   Urobilinogen, Ur 0.2 0.2 - 1.0 mg/dL   Nitrite, UA Negative Negative   Microscopic Examination See  below:         Current Outpatient Medications:    clobetasol ointment (TEMOVATE) 3.23 %, Apply 1 application topically 2 (two) times daily., Disp: 60 g, Rfl: 0   oxybutynin (DITROPAN) 5 MG tablet, Take 1 tablet (5 mg total) by mouth 2 (two) times daily., Disp: 60 tablet, Rfl: 2   timolol (TIMOPTIC) 0.5 % ophthalmic solution, SMARTSIG:In Eye(s), Disp: , Rfl:     Assessment & Plan:  Depression: worsening . Will start pt on celexa 10 mg x 2 weeks then increase to 20 mg x 2   2. Urge incontinence better on oxybutynin  Symptoms resolved now since starting this.  Problem List Items Addressed This Visit   None    Orders Placed This Encounter  Procedures   MM DIAG BREAST TOMO BILATERAL   DG Bone Density   AMB Referral to Community Care Coordinaton     Meds ordered this encounter  Medications   citalopram (CELEXA) 10 MG tablet    Sig: Take 1 tablet (10 mg total) by mouth daily.    Dispense:  30 tablet    Refill:  3     Follow up plan: No follow-ups on file.   Labs next visit : CBC, CMP, FLP, HBA1C, TSH, PSA, urine microalbumin Labs 1 week prior to next visit.

## 2021-04-27 NOTE — Patient Instructions (Signed)
New medication - Celexa 10 mg Take 10 mg of celexa x 2 weeks every day Then take 20 mg ie 2 pills x 1 week and then follow up with me.

## 2021-05-04 ENCOUNTER — Other Ambulatory Visit: Payer: Self-pay | Admitting: Internal Medicine

## 2021-05-04 ENCOUNTER — Ambulatory Visit: Payer: Federal, State, Local not specified - PPO | Admitting: Urology

## 2021-05-04 DIAGNOSIS — Z1231 Encounter for screening mammogram for malignant neoplasm of breast: Secondary | ICD-10-CM

## 2021-05-07 ENCOUNTER — Other Ambulatory Visit: Payer: Self-pay

## 2021-05-07 ENCOUNTER — Ambulatory Visit (INDEPENDENT_AMBULATORY_CARE_PROVIDER_SITE_OTHER): Payer: Federal, State, Local not specified - PPO

## 2021-05-07 DIAGNOSIS — E538 Deficiency of other specified B group vitamins: Secondary | ICD-10-CM | POA: Diagnosis not present

## 2021-05-07 MED ORDER — CYANOCOBALAMIN 1000 MCG/ML IJ SOLN
1000.0000 ug | Freq: Once | INTRAMUSCULAR | Status: AC
  Administered 2021-05-07: 1000 ug via INTRAMUSCULAR

## 2021-05-10 ENCOUNTER — Ambulatory Visit: Payer: Federal, State, Local not specified - PPO | Admitting: Licensed Clinical Social Worker

## 2021-05-10 DIAGNOSIS — F4310 Post-traumatic stress disorder, unspecified: Secondary | ICD-10-CM

## 2021-05-10 DIAGNOSIS — F320 Major depressive disorder, single episode, mild: Secondary | ICD-10-CM

## 2021-05-10 DIAGNOSIS — Z636 Dependent relative needing care at home: Secondary | ICD-10-CM

## 2021-05-10 DIAGNOSIS — G629 Polyneuropathy, unspecified: Secondary | ICD-10-CM

## 2021-05-11 ENCOUNTER — Encounter: Payer: Federal, State, Local not specified - PPO | Admitting: Obstetrics and Gynecology

## 2021-05-11 NOTE — Patient Instructions (Addendum)
Visit Information  Thank you for taking time to visit with me today. Please don't hesitate to contact me if I can be of assistance to you before our next scheduled telephone appointment.  Following are the goals we discussed today:  Patient Goals/Self-Care Activities: Over the next 120 days Increase coping skills and healthy habits Attend scheduled appointments with providers Utilize healthy coping skills discussed Contact PCP office with any questions or concerns  Our next appointment is by telephone on 06/04/21 at 2:30 PM  Please call the care guide team at [phone] if you need to cancel or reschedule your appointment.   If you are experiencing a Mental Health or Behavioral Health Crisis or need someone to talk to, please call 911   Patient verbalizes understanding of instructions provided today and agrees to view in MyChart.   Jenel Lucks, MSW, LCSW [facility] [place]   [facility] [email or web address] Phone [phone] 4:54 PM

## 2021-05-12 ENCOUNTER — Ambulatory Visit
Admission: RE | Admit: 2021-05-12 | Discharge: 2021-05-12 | Disposition: A | Discharge status: Home / Self Care | Payer: Federal, State, Local not specified - PPO | Source: Ambulatory Visit | Attending: Internal Medicine | Admitting: Internal Medicine

## 2021-05-12 ENCOUNTER — Other Ambulatory Visit: Payer: Self-pay

## 2021-05-12 DIAGNOSIS — Z13820 Encounter for screening for osteoporosis: Secondary | ICD-10-CM | POA: Diagnosis present

## 2021-05-12 DIAGNOSIS — Z1231 Encounter for screening mammogram for malignant neoplasm of breast: Secondary | ICD-10-CM
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
## 2021-05-14 NOTE — Progress Notes (Signed)
Please let pt know this was normal.

## 2021-05-17 NOTE — Progress Notes (Signed)
Patient has osteopenia per bone density scan done recently please let her know to increase calcium and vitamin D in diet to have 3 servings of calcium rich food including dairy and yogurt/cheese.  Start citracal otc bid for such

## 2021-05-19 ENCOUNTER — Other Ambulatory Visit: Payer: Self-pay | Admitting: Internal Medicine

## 2021-05-19 NOTE — Telephone Encounter (Signed)
Requested medication (s) are due for refill today: No  Requested medication (s) are on the active medication list: Yes  Last refill:  04/27/21  Future visit scheduled: Yes  Notes to clinic:  Pharmacy requesting 90 day supply.    Requested Prescriptions  Pending Prescriptions Disp Refills   citalopram (CELEXA) 10 MG tablet [Pharmacy Med Name: CITALOPRAM HBR 10 MG TABLET] 90 tablet 2    Sig: TAKE 1 TABLET BY MOUTH EVERY DAY     Psychiatry:  Antidepressants - SSRI Passed - 05/19/2021  1:31 PM      Passed - Completed PHQ-2 or PHQ-9 in the last 360 days      Passed - Valid encounter within last 6 months    Recent Outpatient Visits           3 weeks ago Encounter for screening mammogram for malignant neoplasm of breast   [facility] Vigg, Avanti, MD   2 months ago Urge incontinence of urine   [facility] Vigg, Avanti, MD   2 months ago Need for influenza vaccination   [facility] Vigg, Avanti, MD       Future Appointments             In 6 days Vigg, Avanti, MD [facility]

## 2021-05-20 ENCOUNTER — Encounter: Payer: Self-pay | Admitting: Obstetrics and Gynecology

## 2021-05-20 ENCOUNTER — Other Ambulatory Visit: Payer: Self-pay

## 2021-05-20 ENCOUNTER — Ambulatory Visit: Payer: Federal, State, Local not specified - PPO | Admitting: Obstetrics and Gynecology

## 2021-05-20 VITALS — BP 116/76 | HR 69 | Ht 62.0 in | Wt 141.5 lb

## 2021-05-20 DIAGNOSIS — L900 Lichen sclerosus et atrophicus: Secondary | ICD-10-CM | POA: Diagnosis not present

## 2021-05-20 MED ORDER — CLOBETASOL PROPIONATE 0.05 % EX OINT: 1.0000 "application " | TOPICAL_OINTMENT | CUTANEOUS | 0 refills | Status: DC

## 2021-05-20 NOTE — Progress Notes (Signed)
Patient presents for a follow-up of Lichen sclerosus. Patient states itching is minimal and she is much more comfortable now, reports no more burning with medication treatment. Patient states she is curious to know how to prevent this from returning. Patient states she would like to discuss osteopenia as well as any need for future mammograms.

## 2021-05-20 NOTE — Progress Notes (Signed)
HPI:      [name] is a 75 y.o. G3P0003 who LMP was No LMP recorded. Patient has had a hysterectomy.  Subjective:   She presents today for follow-up of her lichen sclerosus.  She says that she is almost completely better.  Has been using the clobetasol twice daily. She also recently had a DEXA scan showing osteopenia and she would like to discuss this.  She has not been using calcium and vitamin D.  She does do occasional weightbearing exercise and water aerobics.    Hx: The following portions of the patient's history were reviewed and updated as appropriate:             She  has a past medical history of Asthma and Pancreatitis. She does not have any pertinent problems on file. She  has a past surgical history that includes Abdominal hysterectomy; Appendectomy; Cholecystectomy; and Colonoscopy with propofol (N/A, 03/24/2021). Her family history includes Breast cancer (age of onset: 41) in her maternal aunt; Cancer in her brother; Heart disease in her father and mother. She  reports that she has never smoked. She has never used smokeless tobacco. She reports that she does not currently use alcohol. She reports that she does not use drugs. She has a current medication list which includes the following prescription(s): vitamin d3, oxybutynin, timolol, citalopram, and clobetasol ointment. She has No Known Allergies.       Review of Systems:  Review of Systems  Constitutional: Denied constitutional symptoms, night sweats, recent illness, fatigue, fever, insomnia and weight loss.  Eyes: Denied eye symptoms, eye pain, photophobia, vision change and visual disturbance.  Ears/Nose/Throat/Neck: Denied ear, nose, throat or neck symptoms, hearing loss, nasal discharge, sinus congestion and sore throat.  Cardiovascular: Denied cardiovascular symptoms, arrhythmia, chest pain/pressure, edema, exercise intolerance, orthopnea and palpitations.  Respiratory: Denied pulmonary symptoms, asthma,  pleuritic pain, productive sputum, cough, dyspnea and wheezing.  Gastrointestinal: Denied, gastro-esophageal reflux, melena, nausea and vomiting.  Genitourinary: Denied genitourinary symptoms including symptomatic vaginal discharge, pelvic relaxation issues, and urinary complaints.  Musculoskeletal: Denied musculoskeletal symptoms, stiffness, swelling, muscle weakness and myalgia.  Dermatologic: Denied dermatology symptoms, rash and scar.  Neurologic: Denied neurology symptoms, dizziness, headache, neck pain and syncope.  Psychiatric: Denied psychiatric symptoms, anxiety and depression.  Endocrine: Denied endocrine symptoms including hot flashes and night sweats.   Meds:   Current Outpatient Medications on File Prior to Visit  Medication Sig Dispense Refill   Cholecalciferol (VITAMIN D3) 10 MCG (400 UNIT) tablet Take 400 Units by mouth daily.     oxybutynin (DITROPAN) 5 MG tablet Take 1 tablet (5 mg total) by mouth 2 (two) times daily. 60 tablet 2   timolol (TIMOPTIC) 0.5 % ophthalmic solution SMARTSIG:In Eye(s)     citalopram (CELEXA) 10 MG tablet Take 1 tablet (10 mg total) by mouth daily. (Patient not taking: Reported on 05/20/2021) 30 tablet 3   No current facility-administered medications on file prior to visit.      Objective:     Vitals:   05/20/21 1350  BP: 116/76  Pulse: 69   Filed Weights   05/20/21 1350  Weight: 141 lb 8 oz (64.2 kg)                        Assessment:    G3P0003 Patient Active Problem List   Diagnosis Date Noted   Current mild episode of major depressive disorder ([facility]) 04/27/2021   Caregiver stress 04/27/2021   Colon cancer  screening    Urge incontinence of urine 03/16/2021   Elevated serum creatinine 03/16/2021   PTSD (post-traumatic stress disorder) 02/23/2021   Vitamin D deficiency 02/23/2021   Neuropathy 02/23/2021   B12 deficiency 02/23/2021   Screen for colon cancer 02/23/2021   Screening for osteoporosis 02/23/2021   Weight  loss 02/23/2021   Encounter for screening for malignant neoplasm of breast 02/23/2021   Need for influenza vaccination 02/23/2021   Glaucoma 02/23/2021   Screening for cervical cancer 02/23/2021     1. Lichen sclerosus     Patient states this is significantly better and she has been using the cream as directed.    Plan:            1.  Continue clobetasol.  Have discussed the use twice weekly.  2.  Osteopenia discussed.  Use of calcium and vitamin D discussed.  Weightbearing exercise at the fact osteopenia and osteoporosis discussed.  Possible future use of alendronate discussed.  Orders No orders of the defined types were placed in this encounter.    Meds ordered this encounter  Medications   clobetasol ointment (TEMOVATE) 0.05 %    Sig: Apply 1 application topically 2 (two) times a week.    Dispense:  60 g    Refill:  0      F/U  Return in about 11 months (around 04/20/2022). I spent 21 minutes involved in the care of this patient preparing to see the patient by obtaining and reviewing her medical history (including labs, imaging tests and prior procedures), documenting clinical information in the electronic health record (EHR), counseling and coordinating care plans, writing and sending prescriptions, ordering tests or procedures and in direct communicating with the patient and medical staff discussing pertinent items from her history and physical exam.  Finis Bud, M.D. 05/20/2021 2:41 PM

## 2021-05-25 ENCOUNTER — Ambulatory Visit: Payer: Federal, State, Local not specified - PPO | Admitting: Internal Medicine

## 2021-05-25 ENCOUNTER — Encounter: Payer: Self-pay | Admitting: Internal Medicine

## 2021-05-25 ENCOUNTER — Other Ambulatory Visit: Payer: Self-pay

## 2021-05-25 VITALS — BP 128/82 | HR 56 | Temp 97.8°F | Ht 62.01 in | Wt 140.4 lb

## 2021-05-25 DIAGNOSIS — F339 Major depressive disorder, recurrent, unspecified: Secondary | ICD-10-CM | POA: Diagnosis not present

## 2021-05-25 MED ORDER — CITALOPRAM HYDROBROMIDE 40 MG PO TABS: 40.0000 mg | ORAL_TABLET | Freq: Every day | ORAL | 5 refills | Status: DC

## 2021-05-25 NOTE — Progress Notes (Signed)
BP 128/82    Pulse (!) 56    Temp 97.8 F (36.6 C) (Oral)    Ht 5' 2.01" (1.575 m)    Wt 140 lb 6.4 oz (63.7 kg)    SpO2 98%    BMI 25.67 kg/m    Subjective:    Patient ID: Virginia West, female    DOB: 06/27/1945, 75 y.o.   MRN: [id number]  Chief Complaint  Patient presents with   medication f/u    Celexa , Patient states that she has not seen any changes since starting Celexa    HPI: Virginia West is a 75 y.o. female  Depression        This is a recurrent (worse @ this time of the time.  Patient has had a lot of dental issues secondary to the death of her son which she always says is worse during the Christmas holiday season.) problem.  The current episode started 1 to 4 weeks ago.   The onset quality is gradual.   Chief Complaint  Patient presents with   medication f/u    Celexa , Patient states that she has not seen any changes since starting Celexa    Relevant past medical, surgical, family and social history reviewed and updated as indicated. Interim medical history since our last visit reviewed. Allergies and medications reviewed and updated.  Review of Systems  Psychiatric/Behavioral:  Positive for depression.    Per HPI unless specifically indicated above     Objective:    BP 128/82    Pulse (!) 56    Temp 97.8 F (36.6 C) (Oral)    Ht 5' 2.01" (1.575 m)    Wt 140 lb 6.4 oz (63.7 kg)    SpO2 98%    BMI 25.67 kg/m   Wt Readings from Last 3 Encounters:  05/25/21 140 lb 6.4 oz (63.7 kg)  05/20/21 141 lb 8 oz (64.2 kg)  04/27/21 142 lb 3.2 oz (64.5 kg)    Physical Exam Constitutional:      General: She is not in acute distress.    Appearance: She is not ill-appearing, toxic-appearing or diaphoretic.  HENT:     Head: Normocephalic.  Eyes:     Extraocular Movements: Extraocular movements intact.     Pupils: Pupils are equal, round, and reactive to light.  Psychiatric:        Behavior: Behavior normal.     Comments: Tearful sad and depressed   Results  for orders placed or performed in visit on 02/23/21  Microscopic Examination   BLD  Result Value Ref Range   WBC, UA 0-5 0 - 5 /hpf   RBC None seen 0 - 2 /hpf   Epithelial Cells (non renal) 0-10 0 - 10 /hpf   Mucus, UA Present (A) Not Estab.   Bacteria, UA Few (A) None seen/Few  Vitamin B12  Result Value Ref Range   Vitamin B-12 228 (L) 232 - 1,245 pg/mL  CBC with Differential/Platelet  Result Value Ref Range   WBC 9.5 3.4 - 10.8 x10E3/uL   RBC 4.92 3.77 - 5.28 x10E6/uL   Hemoglobin 14.1 11.1 - 15.9 g/dL   Hematocrit 42.6 34.0 - 46.6 %   MCV 87 79 - 97 fL   MCH 28.7 26.6 - 33.0 pg   MCHC 33.1 31.5 - 35.7 g/dL   RDW 14.1 11.7 - 15.4 %   Platelets 287 150 - 450 x10E3/uL   Neutrophils 61 Not Estab. %   Lymphs 27 Not  Estab. %   Monocytes 7 Not Estab. %   Eos 4 Not Estab. %   Basos 1 Not Estab. %   Neutrophils Absolute 5.7 1.4 - 7.0 x10E3/uL   Lymphocytes Absolute 2.5 0.7 - 3.1 x10E3/uL   Monocytes Absolute 0.7 0.1 - 0.9 x10E3/uL   EOS (ABSOLUTE) 0.4 0.0 - 0.4 x10E3/uL   Basophils Absolute 0.1 0.0 - 0.2 x10E3/uL   Immature Granulocytes 0 Not Estab. %   Immature Grans (Abs) 0.0 0.0 - 0.1 x10E3/uL  Comprehensive metabolic panel  Result Value Ref Range   Glucose 82 70 - 99 mg/dL   BUN 18 8 - 27 mg/dL   Creatinine, Ser 1.17 (H) 0.57 - 1.00 mg/dL   eGFR 49 (L) >59 mL/min/1.73   BUN/Creatinine Ratio 15 12 - 28   Sodium 138 134 - 144 mmol/L   Potassium 4.5 3.5 - 5.2 mmol/L   Chloride 100 96 - 106 mmol/L   CO2 18 (L) 20 - 29 mmol/L   Calcium 10.1 8.7 - 10.3 mg/dL   Total Protein 8.3 6.0 - 8.5 g/dL   Albumin 4.8 (H) 3.7 - 4.7 g/dL   Globulin, Total 3.5 1.5 - 4.5 g/dL   Albumin/Globulin Ratio 1.4 1.2 - 2.2   Bilirubin Total 0.2 0.0 - 1.2 mg/dL   Alkaline Phosphatase 83 44 - 121 IU/L   AST 21 0 - 40 IU/L   ALT 13 0 - 32 IU/L  Lipid panel  Result Value Ref Range   Cholesterol, Total 222 (H) 100 - 199 mg/dL   Triglycerides 204 (H) 0 - 149 mg/dL   HDL 60 >39 mg/dL   VLDL  Cholesterol Cal 36 5 - 40 mg/dL   LDL Chol Calc (NIH) 126 (H) 0 - 99 mg/dL   Chol/HDL Ratio 3.7 0.0 - 4.4 ratio  TSH  Result Value Ref Range   TSH 0.996 0.450 - 4.500 uIU/mL  Vitamin D 1,25 dihydroxy  Result Value Ref Range   Vitamin D 1, 25 (OH)2 Total 35 pg/mL   Vitamin D2 1, 25 (OH)2 <10 pg/mL   Vitamin D3 1, 25 (OH)2 32 pg/mL  Bayer DCA Hb A1c Waived (STAT)  Result Value Ref Range   HB A1C (BAYER DCA - WAIVED) 5.3 4.8 - 5.6 %  CBC With Differential/Platelet  Result Value Ref Range   WBC 10.0 3.4 - 10.8 x10E3/uL   RBC 5.00 3.77 - 5.28 x10E6/uL   Hemoglobin 14.5 11.1 - 15.9 g/dL   Hematocrit 42.9 34.0 - 46.6 %   MCV 86 79 - 97 fL   MCH 29.0 26.6 - 33.0 pg   MCHC 33.8 31.5 - 35.7 g/dL   RDW 15.2 11.7 - 15.4 %   Platelets 265 150 - 450 x10E3/uL   Neutrophils 68 Not Estab. %   Lymphs 26 Not Estab. %   MID 6 Not Estab. %   Neutrophils Absolute 6.8 1.4 - 7.0 x10E3/uL   Lymphocytes Absolute 2.6 0.7 - 3.1 x10E3/uL   MID (Absolute) 0.6 0.1 - 1.6 X10E3/uL  Urinalysis, Routine w reflex microscopic  Result Value Ref Range   Specific Gravity, UA 1.015 1.005 - 1.030   pH, UA 5.0 5.0 - 7.5   Color, UA Yellow Yellow   Appearance Ur Clear Clear   Leukocytes,UA 1+ (A) Negative   Protein,UA Negative Negative/Trace   Glucose, UA Negative Negative   Ketones, UA Negative Negative   RBC, UA Negative Negative   Bilirubin, UA Negative Negative   Urobilinogen, Ur 0.2 0.2 -  1.0 mg/dL   Nitrite, UA Negative Negative   Microscopic Examination See below:         Current Outpatient Medications:    Cholecalciferol (VITAMIN D3) 10 MCG (400 UNIT) tablet, Take 400 Units by mouth daily., Disp: , Rfl:    clobetasol ointment (TEMOVATE) 0.94 %, Apply 1 application topically 2 (two) times a week., Disp: 60 g, Rfl: 0   oxybutynin (DITROPAN) 5 MG tablet, Take 1 tablet (5 mg total) by mouth 2 (two) times daily., Disp: 60 tablet, Rfl: 2   timolol (TIMOPTIC) 0.5 % ophthalmic solution, SMARTSIG:In  Eye(s), Disp: , Rfl:    citalopram (CELEXA) 40 MG tablet, Take 1 tablet (40 mg total) by mouth daily., Disp: 30 tablet, Rfl: 5    Assessment & Plan:  Depression/questionable PTSD Will refer to psychiatry.  Patient has been seeing [employer] [facility], [facility] however says this does not help her much Is on celexa 10 mg increase to 20 mg x 1 week then 40 mg daily in the morning. Will refer to psychiatry. Has daughter here but is in [place] for the week and over the weekend sees pt. Was going to swim but stopped sec to Cold weather.  Advised patient to increase exercise routine.  She is new to the area and moved from [place] recently. Im not sure if she is having problems with adjusting to her new lifestyle here which is contributing to her mental issues   Problem List Items Addressed This Visit   None Visit Diagnoses     Depression, recurrent ([place])    -  Primary   Relevant Medications   citalopram (CELEXA) 40 MG tablet   Other Relevant Orders   Ambulatory referral to Psychiatry        Orders Placed This Encounter  Procedures   Ambulatory referral to Psychiatry     Meds ordered this encounter  Medications   citalopram (CELEXA) 40 MG tablet    Sig: Take 1 tablet (40 mg total) by mouth daily.    Dispense:  30 tablet    Refill:  5     Follow up plan: No follow-ups on file.

## 2021-05-25 NOTE — Patient Instructions (Signed)
Is on celexa 10 mg - increase to 20 mg x 1 week   Then start taking 40 mg daily in the monring.

## 2021-06-04 ENCOUNTER — Telehealth: Payer: Federal, State, Local not specified - PPO

## 2021-06-07 ENCOUNTER — Ambulatory Visit: Payer: Federal, State, Local not specified - PPO

## 2021-06-07 ENCOUNTER — Other Ambulatory Visit: Payer: Self-pay

## 2021-06-07 ENCOUNTER — Ambulatory Visit (INDEPENDENT_AMBULATORY_CARE_PROVIDER_SITE_OTHER): Payer: Federal, State, Local not specified - PPO

## 2021-06-07 DIAGNOSIS — E538 Deficiency of other specified B group vitamins: Secondary | ICD-10-CM | POA: Diagnosis not present

## 2021-06-07 MED ORDER — CYANOCOBALAMIN 1000 MCG/ML IJ SOLN
1000.0000 ug | Freq: Once | INTRAMUSCULAR | Status: AC
Start: 2021-06-07 — End: 2021-06-07
  Administered 2021-06-07: 1000 ug via INTRAMUSCULAR

## 2021-06-11 ENCOUNTER — Other Ambulatory Visit: Payer: Self-pay | Admitting: Internal Medicine

## 2021-06-11 NOTE — Telephone Encounter (Signed)
Requested Prescriptions  Pending Prescriptions Disp Refills   oxybutynin (DITROPAN) 5 MG tablet [Pharmacy Med Name: OXYBUTYNIN 5 MG TABLET] 180 tablet 0    Sig: TAKE 1 TABLET BY MOUTH TWICE A DAY     Urology:  Bladder Agents Passed - 06/11/2021  1:30 AM      Passed - Valid encounter within last 12 months    Recent Outpatient Visits          2 weeks ago Depression, recurrent (HCC)   [facility] Vigg, Avanti, MD   1 month ago Encounter for screening mammogram for malignant neoplasm of breast   [facility] Vigg, Avanti, MD   2 months ago Urge incontinence of urine   [facility] Vigg, Avanti, MD   3 months ago Need for influenza vaccination   [facility] Vigg, Avanti, MD      Future Appointments            In 4 days Vigg, Avanti, MD [facility]

## 2021-06-15 ENCOUNTER — Telehealth: Payer: Federal, State, Local not specified - PPO | Admitting: Internal Medicine

## 2021-06-15 ENCOUNTER — Encounter: Payer: Self-pay | Admitting: Internal Medicine

## 2021-06-15 DIAGNOSIS — F339 Major depressive disorder, recurrent, unspecified: Secondary | ICD-10-CM

## 2021-06-15 MED ORDER — CITALOPRAM HYDROBROMIDE 10 MG PO TABS: 10.0000 mg | ORAL_TABLET | Freq: Every day | ORAL | 3 refills | Status: DC

## 2021-06-15 MED ORDER — ONDANSETRON HCL 8 MG PO TABS: 8.0000 mg | ORAL_TABLET | Freq: Three times a day (TID) | ORAL | 0 refills | Status: DC | PRN

## 2021-06-15 NOTE — Progress Notes (Signed)
There were no vitals taken for this visit.   Subjective:    Patient ID: Virginia West, female    DOB: 1946-05-18, 76 y.o.   MRN: [id number]  Chief Complaint  Patient presents with   Depression    Following up on medication     HPI: Virginia West is a 76 y.o. female   This visit was completed via video visit through [place] due to the restrictions of the COVID-19 pandemic. All issues as above were discussed and addressed. Physical exam was done as above through visual confirmation on video through MyChart. If it was felt that the patient should be evaluated in the office, they were directed there. The patient verbally consented to this visit. Location of the patient: home Location of the provider: work Those involved with this call:  Provider: Charlynne Cousins, MD CMA: Frazier Butt, [place] Desk/Registration: [employer]  Time spent on call: 15 minutes with patient face to face via video conference. More than 50% of this time was spent in counseling and coordination of care. 15 minutes total spent in review of patient's record and preparation of their chart.    Depression        This is a chronic (had Nausea from celexa didnt take her meds.) problem.  Associated symptoms include no decreased concentration, no fatigue, no helplessness, not irritable, no restlessness, no decreased interest, no myalgias and no headaches.  Chief Complaint  Patient presents with   Depression    Following up on medication     Relevant past medical, surgical, family and social history reviewed and updated as indicated. Interim medical history since our last visit reviewed. Allergies and medications reviewed and updated.  Review of Systems  Constitutional:  Negative for fatigue.  Musculoskeletal:  Negative for myalgias.  Neurological:  Negative for headaches.  Psychiatric/Behavioral:  Positive for depression. Negative for decreased concentration.    Per HPI unless specifically indicated  above     Objective:    There were no vitals taken for this visit.  Wt Readings from Last 3 Encounters:  05/25/21 140 lb 6.4 oz (63.7 kg)  05/20/21 141 lb 8 oz (64.2 kg)  04/27/21 142 lb 3.2 oz (64.5 kg)    Physical Exam Constitutional:      General: She is not irritable.   Results for orders placed or performed in visit on 02/23/21  Microscopic Examination   BLD  Result Value Ref Range   WBC, UA 0-5 0 - 5 /hpf   RBC None seen 0 - 2 /hpf   Epithelial Cells (non renal) 0-10 0 - 10 /hpf   Mucus, UA Present (A) Not Estab.   Bacteria, UA Few (A) None seen/Few  Vitamin B12  Result Value Ref Range   Vitamin B-12 228 (L) 232 - 1,245 pg/mL  CBC with Differential/Platelet  Result Value Ref Range   WBC 9.5 3.4 - 10.8 x10E3/uL   RBC 4.92 3.77 - 5.28 x10E6/uL   Hemoglobin 14.1 11.1 - 15.9 g/dL   Hematocrit 42.6 34.0 - 46.6 %   MCV 87 79 - 97 fL   MCH 28.7 26.6 - 33.0 pg   MCHC 33.1 31.5 - 35.7 g/dL   RDW 14.1 11.7 - 15.4 %   Platelets 287 150 - 450 x10E3/uL   Neutrophils 61 Not Estab. %   Lymphs 27 Not Estab. %   Monocytes 7 Not Estab. %   Eos 4 Not Estab. %   Basos 1 Not Estab. %   Neutrophils  Absolute 5.7 1.4 - 7.0 x10E3/uL   Lymphocytes Absolute 2.5 0.7 - 3.1 x10E3/uL   Monocytes Absolute 0.7 0.1 - 0.9 x10E3/uL   EOS (ABSOLUTE) 0.4 0.0 - 0.4 x10E3/uL   Basophils Absolute 0.1 0.0 - 0.2 x10E3/uL   Immature Granulocytes 0 Not Estab. %   Immature Grans (Abs) 0.0 0.0 - 0.1 x10E3/uL  Comprehensive metabolic panel  Result Value Ref Range   Glucose 82 70 - 99 mg/dL   BUN 18 8 - 27 mg/dL   Creatinine, Ser 1.17 (H) 0.57 - 1.00 mg/dL   eGFR 49 (L) >59 mL/min/1.73   BUN/Creatinine Ratio 15 12 - 28   Sodium 138 134 - 144 mmol/L   Potassium 4.5 3.5 - 5.2 mmol/L   Chloride 100 96 - 106 mmol/L   CO2 18 (L) 20 - 29 mmol/L   Calcium 10.1 8.7 - 10.3 mg/dL   Total Protein 8.3 6.0 - 8.5 g/dL   Albumin 4.8 (H) 3.7 - 4.7 g/dL   Globulin, Total 3.5 1.5 - 4.5 g/dL   Albumin/Globulin  Ratio 1.4 1.2 - 2.2   Bilirubin Total 0.2 0.0 - 1.2 mg/dL   Alkaline Phosphatase 83 44 - 121 IU/L   AST 21 0 - 40 IU/L   ALT 13 0 - 32 IU/L  Lipid panel  Result Value Ref Range   Cholesterol, Total 222 (H) 100 - 199 mg/dL   Triglycerides 204 (H) 0 - 149 mg/dL   HDL 60 >39 mg/dL   VLDL Cholesterol Cal 36 5 - 40 mg/dL   LDL Chol Calc (NIH) 126 (H) 0 - 99 mg/dL   Chol/HDL Ratio 3.7 0.0 - 4.4 ratio  TSH  Result Value Ref Range   TSH 0.996 0.450 - 4.500 uIU/mL  Vitamin D 1,25 dihydroxy  Result Value Ref Range   Vitamin D 1, 25 (OH)2 Total 35 pg/mL   Vitamin D2 1, 25 (OH)2 <10 pg/mL   Vitamin D3 1, 25 (OH)2 32 pg/mL  Bayer DCA Hb A1c Waived (STAT)  Result Value Ref Range   HB A1C (BAYER DCA - WAIVED) 5.3 4.8 - 5.6 %  CBC With Differential/Platelet  Result Value Ref Range   WBC 10.0 3.4 - 10.8 x10E3/uL   RBC 5.00 3.77 - 5.28 x10E6/uL   Hemoglobin 14.5 11.1 - 15.9 g/dL   Hematocrit 42.9 34.0 - 46.6 %   MCV 86 79 - 97 fL   MCH 29.0 26.6 - 33.0 pg   MCHC 33.8 31.5 - 35.7 g/dL   RDW 15.2 11.7 - 15.4 %   Platelets 265 150 - 450 x10E3/uL   Neutrophils 68 Not Estab. %   Lymphs 26 Not Estab. %   MID 6 Not Estab. %   Neutrophils Absolute 6.8 1.4 - 7.0 x10E3/uL   Lymphocytes Absolute 2.6 0.7 - 3.1 x10E3/uL   MID (Absolute) 0.6 0.1 - 1.6 X10E3/uL  Urinalysis, Routine w reflex microscopic  Result Value Ref Range   Specific Gravity, UA 1.015 1.005 - 1.030   pH, UA 5.0 5.0 - 7.5   Color, UA Yellow Yellow   Appearance Ur Clear Clear   Leukocytes,UA 1+ (A) Negative   Protein,UA Negative Negative/Trace   Glucose, UA Negative Negative   Ketones, UA Negative Negative   RBC, UA Negative Negative   Bilirubin, UA Negative Negative   Urobilinogen, Ur 0.2 0.2 - 1.0 mg/dL   Nitrite, UA Negative Negative   Microscopic Examination See below:         Current Outpatient Medications:  Cholecalciferol (VITAMIN D3) 10 MCG (400 UNIT) tablet, Take 400 Units by mouth daily., Disp: , Rfl:     citalopram (CELEXA) 40 MG tablet, Take 1 tablet (40 mg total) by mouth daily., Disp: 30 tablet, Rfl: 5   clobetasol ointment (TEMOVATE) 9.17 %, Apply 1 application topically 2 (two) times a week., Disp: 60 g, Rfl: 0   oxybutynin (DITROPAN) 5 MG tablet, TAKE 1 TABLET BY MOUTH TWICE A DAY, Disp: 180 tablet, Rfl: 0   timolol (TIMOPTIC) 0.5 % ophthalmic solution, SMARTSIG:In Eye(s), Disp: , Rfl:     Assessment & Plan:  Depression : will restart on celexa on 10 mg. potential Side effects dw pt. to call office if develops any SE. will fu with me in 1 month for such. pt verbalised understanding.  Follow up plan: No follow-ups on file.

## 2021-07-05 ENCOUNTER — Other Ambulatory Visit: Payer: Self-pay

## 2021-07-05 ENCOUNTER — Ambulatory Visit (INDEPENDENT_AMBULATORY_CARE_PROVIDER_SITE_OTHER): Payer: Federal, State, Local not specified - PPO

## 2021-07-05 DIAGNOSIS — E538 Deficiency of other specified B group vitamins: Secondary | ICD-10-CM | POA: Diagnosis not present

## 2021-07-05 MED ORDER — CYANOCOBALAMIN 1000 MCG/ML IJ SOLN
1000.0000 ug | Freq: Once | INTRAMUSCULAR | Status: AC
Start: 2021-07-05 — End: 2021-07-05
  Administered 2021-07-05: 1000 ug via INTRAMUSCULAR

## 2021-07-05 NOTE — Progress Notes (Signed)
Patient present today for B-12 injection, patient received in left deltoid, patient tolerated well

## 2021-07-08 ENCOUNTER — Ambulatory Visit: Payer: Federal, State, Local not specified - PPO

## 2021-07-08 NOTE — ED Notes (Signed)
Reviewing chart, closing chart :    Pt had thyroid nodules, noted,   Attempted calls then , no response,     Pt did see her results, in my chart.     Today : spoke with PCP office : dr. Primitivo Gauze, office manager,   Pt was seen end of august in their office, confirmed they did receive the results d/c summary, , no comment noted on the pt per their records.       Today have attempted again to call pt, to close chart out, left message if no responses will send letter again.      Leonia Reeves, MD  07/08/21 1031

## 2021-07-16 ENCOUNTER — Other Ambulatory Visit: Payer: Self-pay

## 2021-07-16 ENCOUNTER — Ambulatory Visit (INDEPENDENT_AMBULATORY_CARE_PROVIDER_SITE_OTHER): Payer: Federal, State, Local not specified - PPO | Admitting: Psychiatry

## 2021-07-16 ENCOUNTER — Encounter: Payer: Self-pay | Admitting: Psychiatry

## 2021-07-16 VITALS — BP 178/90 | HR 63 | Ht 62.0 in | Wt 145.0 lb

## 2021-07-16 DIAGNOSIS — F4310 Post-traumatic stress disorder, unspecified: Secondary | ICD-10-CM | POA: Diagnosis not present

## 2021-07-16 DIAGNOSIS — Z634 Disappearance and death of family member: Secondary | ICD-10-CM | POA: Insufficient documentation

## 2021-07-16 DIAGNOSIS — F332 Major depressive disorder, recurrent severe without psychotic features: Secondary | ICD-10-CM | POA: Diagnosis not present

## 2021-07-16 DIAGNOSIS — F4381 Prolonged grief disorder: Secondary | ICD-10-CM | POA: Diagnosis not present

## 2021-07-16 MED ORDER — MIRTAZAPINE 7.5 MG PO TABS: 7.5000 mg | ORAL_TABLET | Freq: Every day | ORAL | 1 refills | Status: DC

## 2021-07-16 MED ORDER — CLONAZEPAM 0.5 MG PO TABS
0.2500 mg | ORAL_TABLET | Freq: Every day | ORAL | 0 refills | Status: DC | PRN
Start: 2021-07-16 — End: 2023-03-07

## 2021-07-16 NOTE — Patient Instructions (Signed)
Clonazepam Tablets What is this medication? CLONAZEPAM (kloe NA ze pam) treats seizures. It may also be used to treat panic disorder. It works by Child psychotherapist system calm down. It belongs to a group of medications called benzodiazepines. This medicine may be used for other purposes; ask your health care provider or pharmacist if you have questions. COMMON BRAND NAME(S): Ceberclon, Klonopin What should I tell my care team before I take this medication? They need to know if you have any of these conditions: An alcohol or drug abuse problem Bipolar disorder, depression, psychosis or other mental health condition Glaucoma Kidney or liver disease Lung or breathing disease Myasthenia gravis Parkinson disease Porphyria Seizures or a history of seizures Suicidal thoughts An unusual or allergic reaction to clonazepam, other benzodiazepines, foods, dyes, or preservatives Pregnant or trying to get pregnant Breast-feeding How should I use this medication? Take this medication by mouth with a glass of water. Follow the directions on the prescription label. If it upsets your stomach, take it with food or milk. Take your medication at regular intervals. Do not take it more often than directed. Do not stop taking or change the dose except on the advice of your care team. A special MedGuide will be given to you by the pharmacist with each prescription and refill. Be sure to read this information carefully each time. Talk to your care team regarding the use of this medication in children. Special care may be needed. Overdosage: If you think you have taken too much of this medicine contact a poison control center or emergency room at once. NOTE: This medicine is only for you. Do not share this medicine with others. What if I miss a dose? If you miss a dose, take it as soon as you can. If it is almost time for your next dose, take only that dose. Do not take double or extra doses. What may interact  with this medication? Do not take this medication with any of the following: Narcotic medications for cough Sodium oxybate This medication may also interact with the following: Alcohol Antihistamines for allergy, cough and cold Antiviral medications for HIV or AIDS Certain medications for anxiety or sleep Certain medications for depression, like amitriptyline, fluoxetine, sertraline Certain medications for fungal infections like ketoconazole and itraconazole Certain medications for seizures like carbamazepine, phenobarbital, phenytoin, primidone General anesthetics like halothane, isoflurane, methoxyflurane, propofol Local anesthetics like lidocaine, pramoxine, tetracaine Medications that relax muscles for surgery Narcotic medications for pain Phenothiazines like chlorpromazine, mesoridazine, prochlorperazine, thioridazine This list may not describe all possible interactions. Give your health care provider a list of all the medicines, herbs, non-prescription drugs, or dietary supplements you use. Also tell them if you smoke, drink alcohol, or use illegal drugs. Some items may interact with your medicine. What should I watch for while using this medication? Tell your care team if your symptoms do not start to get better or if they get worse. Do not stop taking except on your care team's advice. You may develop a severe reaction. Your care team will tell you how much medication to take. You may get drowsy or dizzy. Do not drive, use machinery, or do anything that needs mental alertness until you know how this medication affects you. To reduce the risk of dizzy and fainting spells, do not stand or sit up quickly, especially if you are an older patient. Alcohol may increase dizziness and drowsiness. Avoid alcoholic drinks. If you are taking another medication that also causes drowsiness, you may  have more side effects. Give your care team a list of all medications you use. Your care team will tell  you how much medication to take. Do not take more medication than directed. Call emergency services if you have problems breathing or unusual sleepiness. The use of this medication may increase the chance of suicidal thoughts or actions. Pay special attention to how you are responding while on this medication. Any worsening of mood, or thoughts of suicide or dying should be reported to your care team right away. What side effects may I notice from receiving this medication? Side effects that you should report to your care team as soon as possible: Allergic reactions--skin rash, itching, hives, swelling of the face, lips, tongue, or throat CNS depression--slow or shallow breathing, shortness of breath, feeling faint, dizziness, confusion, trouble staying awake Thoughts of suicide or self-harm, worsening mood, feelings of depression Side effects that usually do not require medical attention (report to your care team if they continue or are bothersome): Dizziness Drowsiness Headache This list may not describe all possible side effects. Call your doctor for medical advice about side effects. You may report side effects to FDA at 1-800-FDA-1088. Where should I keep my medication? Keep out of the reach of children and pets. This medication can be abused. Keep your medication in a safe place to protect it from theft. Do not share this medication with anyone. Selling or giving away this medication is dangerous and against the law. Store at room temperature between 15 and 30 degrees C (59 and 86 degrees F). Protect from light. Keep container tightly closed. This medication may cause accidental overdose and death if taken by other adults, children, or pets. Mix any unused medication with a substance like cat litter or coffee grounds. Then throw the medication away in a sealed container like a sealed bag or a coffee can with a lid. Do not use the medication after the expiration date. NOTE: This sheet is a  summary. It may not cover all possible information. If you have questions about this medicine, talk to your doctor, pharmacist, or health care provider.  2022 [employer] Standard (2020-06-12 00:00:00) Mirtazapine Tablets What is this medication? MIRTAZAPINE (mir TAZ a peen) treats depression. It increases the amount of serotonin and norepinephrine in the brain, hormones that help regulate mood. This medicine may be used for other purposes; ask your health care provider or pharmacist if you have questions. COMMON BRAND NAME(S): Remeron What should I tell my care team before I take this medication? They need to know if you have any of these conditions: Bipolar disorder Glaucoma Kidney disease Liver disease Suicidal thoughts An unusual or allergic reaction to mirtazapine, other medications, foods, dyes, or preservatives Pregnant or trying to get pregnant Breast-feeding How should I use this medication? Take this medication by mouth with a glass of water. Follow the directions on the prescription label. Take your medication at regular intervals. Do not take your medication more often than directed. Do not stop taking this medication suddenly except upon the advice of your care team. Stopping this medication too quickly may cause serious side effects or your condition may worsen. A special MedGuide will be given to you by the pharmacist with each prescription and refill. Be sure to read this information carefully each time. Talk to your care team about the use of this medication in children. Special care may be needed. Overdosage: If you think you have taken too much of this medicine contact a poison control  center or emergency room at once. NOTE: This medicine is only for you. Do not share this medicine with others. What if I miss a dose? If you miss a dose, take it as soon as you can. If it is almost time for your next dose, take only that dose. Do not take double or extra doses. What may  interact with this medication? Do not take this medication with any of the following: Linezolid MAOIs like Carbex, Eldepryl, Marplan, Nardil, and Parnate Methylene blue (injected into a vein) This medication may also interact with the following: Alcohol Antiviral medications for HIV or AIDS Certain medications that treat or prevent blood clots like warfarin Certain medications for depression, anxiety, or psychotic disturbances Certain medications for fungal infections like ketoconazole and itraconazole Certain medications for migraine headache like almotriptan, eletriptan, frovatriptan, naratriptan, rizatriptan, sumatriptan, zolmitriptan Certain medications for seizures like carbamazepine or phenytoin Certain medications for sleep Cimetidine Erythromycin Fentanyl Lithium Medications for blood pressure Nefazodone Rasagiline Rifampin Supplements like St. John's wort, kava kava, valerian Tramadol Tryptophan This list may not describe all possible interactions. Give your health care provider a list of all the medicines, herbs, non-prescription drugs, or dietary supplements you use. Also tell them if you smoke, drink alcohol, or use illegal drugs. Some items may interact with your medicine. What should I watch for while using this medication? Tell your care team if your symptoms do not get better or if they get worse. Visit your care team for regular checks on your progress. Because it may take several weeks to see the full effects of this medication, it is important to continue your treatment as prescribed by your doctor. Patients and their families should watch out for new or worsening thoughts of suicide or depression. Also watch out for sudden changes in feelings such as feeling anxious, agitated, panicky, irritable, hostile, aggressive, impulsive, severely restless, overly excited and hyperactive, or not being able to sleep. If this happens, especially at the beginning of treatment or  after a change in dose, call your care team. You may get drowsy or dizzy. Do not drive, use machinery, or do anything that needs mental alertness until you know how this medication affects you. Do not stand or sit up quickly, especially if you are an older patient. This reduces the risk of dizzy or fainting spells. Alcohol may interfere with the effect of this medication. Avoid alcoholic drinks. This medication may cause dry eyes and blurred vision. If you wear contact lenses you may feel some discomfort. Lubricating drops may help. See your eye care team if the problem does not go away or is severe. Your mouth may get dry. Chewing sugarless gum or sucking hard candy, and drinking plenty of water may help. Contact your care team if the problem does not go away or is severe. What side effects may I notice from receiving this medication? Side effects that you should report to your care team as soon as possible: Allergic reactions--skin rash, itching, hives, swelling of the face, lips, tongue, or throat Heart rhythm changes--fast or irregular heartbeat, dizziness, feeling faint or lightheaded, chest pain, trouble breathing Infection--fever, chills, cough, or sore throat Irritability, confusion, fast or irregular heartbeat, muscle stiffness, twitching muscles, sweating, high fever, seizure, chills, vomiting, diarrhea, which may be signs of serotonin syndrome Low sodium level--muscle weakness, fatigue, dizziness, headache, confusion Rash, fever, and swollen lymph nodes Redness, blistering, peeling or loosening of the skin, including inside the mouth Seizures Sudden eye pain or change in vision such  as blurry vision, seeing halos around lights, vision loss Thoughts of suicide or self-harm, worsening mood, feelings of depression Side effects that usually do not require medical attention (report to your care team if they continue or are bothersome): Constipation Dizziness Drowsiness Dry mouth Increase  in appetite Weight gain This list may not describe all possible side effects. Call your doctor for medical advice about side effects. You may report side effects to FDA at 1-800-FDA-1088. Where should I keep my medication? Keep out of the reach of children. Store at room temperature between 15 and 30 degrees C (59 and 86 degrees F) Protect from light and moisture. Throw away any unused medication after the expiration date. NOTE: This sheet is a summary. It may not cover all possible information. If you have questions about this medicine, talk to your doctor, pharmacist, or health care provider.  2022 [employer] Standard (2020-08-12 00:00:00)
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
## 2021-08-02 ENCOUNTER — Ambulatory Visit: Payer: Self-pay | Admitting: Psychiatry

## 2021-08-10 ENCOUNTER — Other Ambulatory Visit: Payer: Self-pay | Admitting: Psychiatry

## 2021-08-10 DIAGNOSIS — F4381 Prolonged grief disorder: Secondary | ICD-10-CM

## 2021-08-10 DIAGNOSIS — F4310 Post-traumatic stress disorder, unspecified: Secondary | ICD-10-CM

## 2021-08-12 ENCOUNTER — Ambulatory Visit (INDEPENDENT_AMBULATORY_CARE_PROVIDER_SITE_OTHER): Payer: Federal, State, Local not specified - PPO | Admitting: Psychiatry

## 2021-08-12 ENCOUNTER — Other Ambulatory Visit: Payer: Self-pay

## 2021-08-12 ENCOUNTER — Encounter: Payer: Self-pay | Admitting: Psychiatry

## 2021-08-12 VITALS — BP 131/80 | HR 65 | Temp 98.1°F | Wt 145.0 lb

## 2021-08-12 DIAGNOSIS — F4381 Prolonged grief disorder: Secondary | ICD-10-CM

## 2021-08-12 DIAGNOSIS — F332 Major depressive disorder, recurrent severe without psychotic features: Secondary | ICD-10-CM

## 2021-08-12 DIAGNOSIS — F4310 Post-traumatic stress disorder, unspecified: Secondary | ICD-10-CM

## 2021-08-12 NOTE — Progress Notes (Signed)
BH MD OP Progress Note ? ?08/12/2021 5:52 PM ?[place]ia West  ?MRN:  [id number] ? ?Chief Complaint:  ?Chief Complaint  ?Patient presents with  ? Follow-up: 76 year old Caucasian female with history of PTSD, MDD, prolonged grief disorder, presented for medication management.  ? ?HPI: [place] West is a 76 year old Caucasian female with history of MDD, PTSD, prolonged grief disorder, multiple medical problems including overage incontinence, pancreatitis, vitamin B12 deficiency was evaluated in office today. ? ?Patient today reports she had side effects of diarrhea on the mirtazapine initially.  She however reports that has gotten better.  She is sleeping better.  She reports she continues to have multiple psychosocial stressors including her husband who struggles with multiple medical problems including cancer as well as dementia.  She is the primary caregiver and hence it has been stressful for her.  She does have a daughter who works mostly in [place] and has to travel hence she is unable to help her much. ? ?Patient reports she is worried about weight gain side effects of mirtazapine.  However based on weight monitoring she has not gained any weight in the past few weeks since being on this medication.  Patient agreeable to giving this medication more time. ? ?Patient continues to have sadness, anxiety symptoms, grief from the death of her son. ? ?Patient agreeable to establishing care with a therapist, has been unable to find a therapist. ? ?Patient denies any suicidality, homicidality or perceptual disturbances. ? ?Patient denies any other concerns today. ? ?Visit Diagnosis:  ?  ICD-10-CM   ?1. PTSD (post-traumatic stress disorder)  F43.10   ?  ?2. Severe episode of recurrent major depressive disorder, without psychotic features ([facility])  F33.2   ?  ?3. 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  ? ? ?Past Psychiatric History: Reviewed past psychiatric history from progress note on 07/16/2021.  Past trials of Celexa-GI side  effects. ? ?Past Medical History:  ?Past Medical History:  ?Diagnosis Date  ? Asthma   ? Pancreatitis   ?  ?Past Surgical History:  ?Procedure Laterality Date  ? ABDOMINAL HYSTERECTOMY    ? APPENDECTOMY    ? CHOLECYSTECTOMY    ? COLONOSCOPY WITH PROPOFOL N/A 03/24/2021  ? Procedure: COLONOSCOPY WITH PROPOFOL;  Surgeon: Toney Reil, MD;  Location[facility] ENDOSCOPY;  Service: Gastroenterology;  Laterality: N/A;  ? ? ?Family Psychiatric History: Reviewed family psychiatric history from progress note on 07/16/2021. ? ?Family History:  ?Family History  ?Problem Relation Age of Onset  ? Heart disease Mother   ? Heart disease Father   ? Cancer Brother   ? Breast cancer Maternal Aunt 80  ? Cancer Son   ? Depression Son   ? ? ?Social History: Reviewed social history from progress note on 07/16/2021. ?Social History  ? ?Socioeconomic History  ? Marital status: Married  ?  Spouse name: Not on file  ? Number of children: 3  ? Years of education: Not on file  ? Highest education level: Some college, no degree  ?Occupational History  ? Occupation: retired  ?Tobacco Use  ? Smoking status: Never  ?  Passive exposure: Past  ? Smokeless tobacco: Never  ?Vaping Use  ? Vaping Use: Never used  ?Substance and Sexual Activity  ? Alcohol use: Not Currently  ? Drug use: Never  ? Sexual activity: Not Currently  ?Other Topics Concern  ? Not on file  ?Social History Narrative  ? Not on file  ? ?Social Determinants of Health  ? ?Financial  Resource Strain: Not on file  ?Food Insecurity: Not on file  ?Transportation Needs: Not on file  ?Physical Activity: Not on file  ?Stress: Not on file  ?Social Connections: Not on file  ? ? ?Allergies: No Known Allergies ? ?Metabolic Disorder Labs: ?Lab Results  ?Component Value Date  ? HGBA1C 5.3 02/23/2021  ? ?No results found for: PROLACTIN ?Lab Results  ?Component Value Date  ? CHOL 222 (H) 02/23/2021  ? TRIG 204 (H) 02/23/2021  ? HDL 60 02/23/2021  ? CHOLHDL 3.7 02/23/2021  ? LDLCALC 126 (H)  02/23/2021  ? ?Lab Results  ?Component Value Date  ? TSH 0.996 02/23/2021  ? ? ?Therapeutic Level Labs: ?No results found for: LITHIUM ?No results found for: VALPROATE ?No components found for:  CBMZ ? ?Current Medications: ?Current Outpatient Medications  ?Medication Sig Dispense Refill  ? Cholecalciferol (VITAMIN D3) 10 MCG (400 UNIT) tablet Take 400 Units by mouth daily.    ? clobetasol ointment (TEMOVATE) 0.05 % Apply 1 application topically 2 (two) times a week. 60 g 0  ? clonazePAM (KLONOPIN) 0.5 MG tablet Take 0.5-1 tablets (0.25-0.5 mg total) by mouth daily as needed for anxiety. Take as needed for severe anxiety, grief- please limit use 21 tablet 0  ? cyanocobalamin (,VITAMIN B-12,) 1000 MCG/ML injection Inject 1,000 mcg into the muscle once.    ? mirtazapine (REMERON) 7.5 MG tablet Take 1 tablet (7.5 mg total) by mouth at bedtime. 30 tablet 1  ? ondansetron (ZOFRAN) 8 MG tablet Take 1 tablet (8 mg total) by mouth every 8 (eight) hours as needed for nausea or vomiting. 20 tablet 0  ? oxybutynin (DITROPAN) 5 MG tablet TAKE 1 TABLET BY MOUTH TWICE A DAY 180 tablet 0  ? timolol (TIMOPTIC) 0.5 % ophthalmic solution SMARTSIG:In Eye(s)    ? ?No current facility-administered medications for this visit.  ? ? ? ?Musculoskeletal: ?Strength & Muscle Tone: within normal limits ?Gait & Station: normal ?Patient leans: N/A ? ?Psychiatric Specialty Exam: ?Review of Systems  ?Psychiatric/Behavioral:  Positive for dysphoric mood and sleep disturbance. The patient is nervous/anxious.   ?All other systems reviewed and are negative.  ?Blood pressure 131/80, pulse 65, temperature 98.1 ?F (36.7 ?C), temperature source Temporal, weight 145 lb (65.8 kg).Body mass index is 26.52 kg/m?.  ?General Appearance: Casual  ?Eye Contact:  Fair  ?Speech:  Clear and Coherent  ?Volume:  Normal  ?Mood:  Anxious, Depressed, and Dysphoric  ?Affect:  Depressed  ?Thought Process:  Goal Directed and Descriptions of Associations: Intact  ?Orientation:   Full (Time, Place, and Person)  ?Thought Content: Logical   ?Suicidal Thoughts:  No  ?Homicidal Thoughts:  No  ?Memory:  Immediate;   Fair ?Recent;   Fair ?Remote;   Fair  ?Judgement:  Fair  ?Insight:  Fair  ?Psychomotor Activity:  Normal  ?Concentration:  Concentration: Fair and Attention Span: Fair  ?Recall:  Fair  ?Fund of Knowledge: Fair  ?Language: Fair  ?Akathisia:  No  ?Handed:  Right  ?AIMS (if indicated): done, 0  ?Assets:  Communication Skills ?Desire for Improvement ?Housing ?Social Support ?Talents/Skills  ?ADL's:  Intact  ?Cognition: WNL  ?Sleep:   improving  ? ?Screenings: ?GAD-7   ? ?Flowsheet Row Office Visit from 07/16/2021 in [facility] Video Visit from 06/15/2021 in [facility] Office Visit from 05/25/2021 in [facility] Office Visit from 04/27/2021 in [facility]  ?Total GAD-7 Score 8 12 18 17   ? ?  ? ?PHQ2-9   ? ?  Flowsheet Row [facility]12/2021 [facility] Video [facility] from [facility]2021 [facility] from [facility]21 [facility]  ?PHQ-2 Total Score 4 5 2 6 5   ?PHQ-9 Total Score 12 15 6 18 18   ? ?  ? ?Flowsheet Row [facility]20[facility] Office Visit from [facility] Admission (Discharged) from [facility]/2021 [facility]  ?C-SSRS RISK CATEGORY Low Risk Low Risk No Risk  ? ?  ? ? ? ?Assessment and Plan: Virginia IgoSusan Key is a 76 year old Caucasian female, married, retired, lives in [place], has a history of PTSD, MDD, prolonged grief, multiple medical problems, presented for medication management.  Patient with some improvement on the mirtazapine however will continue to benefit from medication management as  well as psychotherapy sessions.  Plan as noted below. ? ?Plan ?PTSD-unstable ?Mirtazapine 7.5 mg p.o. nightly ?Klonopin 0.5 mg as needed for severe anxiety attacks-she has not used it yet. ?I have provided resou

## 2021-08-12 NOTE — Patient Instructions (Signed)
[facility] ?[email or web address] ?[address] [place] [address] ? ?[phone] ? ?[facility] ?Counseling & mental health ?Directions ?[address], [place], [place] [address] ? ?[phone] ? ?Felecia Jan ?[phone] ? ? ? ?

## 2021-08-18 ENCOUNTER — Other Ambulatory Visit: Payer: Self-pay

## 2021-08-18 ENCOUNTER — Ambulatory Visit (INDEPENDENT_AMBULATORY_CARE_PROVIDER_SITE_OTHER): Payer: Federal, State, Local not specified - PPO

## 2021-08-18 DIAGNOSIS — E538 Deficiency of other specified B group vitamins: Secondary | ICD-10-CM

## 2021-08-18 MED ORDER — CYANOCOBALAMIN 1000 MCG/ML IJ SOLN
1000.0000 ug | Freq: Once | INTRAMUSCULAR | Status: AC
  Administered 2021-08-18: 1000 ug via INTRAMUSCULAR

## 2021-09-09 ENCOUNTER — Other Ambulatory Visit: Payer: Self-pay | Admitting: Internal Medicine

## 2021-09-09 ENCOUNTER — Other Ambulatory Visit: Payer: Self-pay | Admitting: Psychiatry

## 2021-09-09 DIAGNOSIS — F4310 Post-traumatic stress disorder, unspecified: Secondary | ICD-10-CM

## 2021-09-09 DIAGNOSIS — F4381 Prolonged grief disorder: Secondary | ICD-10-CM

## 2021-09-09 NOTE — Telephone Encounter (Signed)
Requested Prescriptions  ?Pending Prescriptions Disp Refills  ?? oxybutynin (DITROPAN) 5 MG tablet [Pharmacy Med Name: OXYBUTYNIN 5 MG TABLET] 180 tablet 0  ?  Sig: TAKE 1 TABLET BY MOUTH TWICE A DAY  ?  ? Urology:  Bladder Agents Passed - 09/09/2021  2:52 AM  ?  ?  Passed - Valid encounter within last 12 months  ?  Recent Outpatient Visits   ?      ? 2 months ago Depression, recurrent (HCC)  ? [facility] Vigg, Avanti, MD  ? 3 months ago Depression, recurrent (HCC)  ? [facility] Vigg, Avanti, MD  ? 4 months ago Encounter for screening mammogram for malignant neoplasm of breast  ? [facility] Vigg, Avanti, MD  ? 5 months ago Urge incontinence of urine  ? [facility] V[facility], Avanti, MD  ? 6 months ago Need for influenza vaccination  ? [facility] Vigg, Avanti, MD  ?  ?  ?Future Appointments   ?        ? In 1 month Vigg, Avanti, MD [facility]  ?  ? ?  ?  ?  ? ?

## 2021-09-20 ENCOUNTER — Ambulatory Visit (INDEPENDENT_AMBULATORY_CARE_PROVIDER_SITE_OTHER): Payer: Federal, State, Local not specified - PPO

## 2021-09-20 DIAGNOSIS — E538 Deficiency of other specified B group vitamins: Secondary | ICD-10-CM | POA: Diagnosis not present

## 2021-09-20 MED ORDER — CYANOCOBALAMIN 1000 MCG/ML IJ SOLN
1000.0000 ug | Freq: Once | INTRAMUSCULAR | Status: AC
  Administered 2021-09-20: 1000 ug via INTRAMUSCULAR

## 2021-09-23 ENCOUNTER — Telehealth: Payer: Self-pay | Admitting: Psychiatry

## 2021-09-23 ENCOUNTER — Ambulatory Visit (INDEPENDENT_AMBULATORY_CARE_PROVIDER_SITE_OTHER): Payer: Federal, State, Local not specified - PPO | Admitting: Psychiatry

## 2021-09-23 ENCOUNTER — Encounter: Payer: Self-pay | Admitting: Psychiatry

## 2021-09-23 ENCOUNTER — Telehealth (HOSPITAL_COMMUNITY): Payer: Self-pay

## 2021-09-23 VITALS — BP 144/74 | HR 54 | Ht 62.0 in | Wt 148.0 lb

## 2021-09-23 DIAGNOSIS — F4381 Prolonged grief disorder: Secondary | ICD-10-CM

## 2021-09-23 DIAGNOSIS — F332 Major depressive disorder, recurrent severe without psychotic features: Secondary | ICD-10-CM

## 2021-09-23 DIAGNOSIS — F4310 Post-traumatic stress disorder, unspecified: Secondary | ICD-10-CM

## 2021-09-23 MED ORDER — BUPROPION HCL 75 MG PO TABS: 75.0000 mg | ORAL_TABLET | Freq: Every day | ORAL | 1 refills | Status: DC

## 2021-09-23 NOTE — Telephone Encounter (Signed)
Thank you :)

## 2021-09-23 NOTE — Progress Notes (Signed)
BH MD OP Progress Note ? ?09/23/2021 11:41 AM ?Virginia IgoSusan Hogle  ?MRN:  [id number] ? ?Chief Complaint:  ?Chief Complaint  ?Patient presents with  ? Follow-up: 76 year old Caucasian female with history of PTSD, MDD, prolonged grief disorder, presented for medication management.  ? ?HPI: Virginia West is a 76 year old Caucasian female married, lives in  [place],with history of MDD, PTSD, prolonged grief disorder, multiple medical problems including urinary incontinence, pancreatitis, vitamin B12 deficiency was evaluated in office today. ? ?Patient today appeared to be overwhelmed, anxious, tearful in session.  Patient today reports she continues to have a lot going on.  She continues to ruminate about her past trauma.  Patient reports she has not been able to come to terms with the fact that she had to go through something like that.  Patient also reports that her husband has dementia and right now he is also going through cancer treatment.  She has to take him to the cancer Center every day for radiation treatment.  This is also overwhelming for her. ? ?Patient reports sleep has improved some on the mirtazapine.  She however does not like the effect of the mirtazapine, feels as though it is making her slow down and sluggish. ? ?Patient reports lack of motivation, low energy throughout the day.  Patient reports sadness and crying spells.  She reports reduced appetite. ? ?Patient denies suicidality however reports she often thinks about what is the purpose of her life. ? ?Patient denies any perceptual disturbances or homicidality. ? ?She has not been able to find a therapist yet. ? ? ? ?Visit Diagnosis:  ?  ICD-10-CM   ?1. PTSD (post-traumatic stress disorder)  F43.10   ?  ?2. Severe episode of recurrent major depressive disorder, without psychotic features (HCC)  F33.2 buPROPion (WELLBUTRIN) 75 MG tablet  ?  ?3. 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  ? ? ?Past Psychiatric History: Reviewed past psychiatric history from  progress note on 07/16/2021.  Past trials of Celexa-GI side effects. ? ?Past Medical History:  ?Past Medical History:  ?Diagnosis Date  ? Asthma   ? Pancreatitis   ?  ?Past Surgical History:  ?Procedure Laterality Date  ? ABDOMINAL HYSTERECTOMY    ? APPENDECTOMY    ? CHOLECYSTECTOMY    ? COLONOSCOPY WITH PROPOFOL N/A 03/24/2021  ? Procedure: COLONOSCOPY WITH PROPOFOL;  Surgeon: Toney ReilVanga, Rohini Reddy, MD;  Location: [facility];  Service: Gastroenterology;  Laterality: N/A;  ? ? ?Family Psychiatric History: Reviewed family psychiatric history from progress note on 07/16/2021. ? ?Family History:  ?Family History  ?Problem Relation Age of Onset  ? Heart disease Mother   ? Heart disease Father   ? Cancer Brother   ? Breast cancer Maternal Aunt 80  ? Cancer Son   ? Depression Son   ? ? ?Social History: Reviewed social history from progress note on 07/16/2021. ?Social History  ? ?Socioeconomic History  ? Marital status: Married  ?  Spouse name: Not on file  ? Number of children: 3  ? Years of education: Not on file  ? Highest education level: Some college, no degree  ?Occupational History  ? Occupation: retired  ?Tobacco Use  ? Smoking status: Never  ?  Passive exposure: Past  ? Smokeless tobacco: Never  ?Vaping Use  ? Vaping Use: Never used  ?Substance and Sexual Activity  ? Alcohol use: Not Currently  ? Drug use: Never  ? Sexual activity: Not Currently  ?Other Topics Concern  ? Not on  file  ?Social History Narrative  ? Not on file  ? ?Social Determinants of Health  ? ?Financial Resource Strain: Not on file  ?Food Insecurity: Not on file  ?Transportation Needs: Not on file  ?Physical Activity: Not on file  ?Stress: Not on file  ?Social Connections: Not on file  ? ? ?Allergies: No Known Allergies ? ?Metabolic Disorder Labs: ?Lab Results  ?Component Value Date  ? HGBA1C 5.3 02/23/2021  ? ?No results found for: PROLACTIN ?Lab Results  ?Component Value Date  ? CHOL 222 (H) 02/23/2021  ? TRIG 204 (H) 02/23/2021  ? HDL 60  02/23/2021  ? CHOLHDL 3.7 02/23/2021  ? LDLCALC 126 (H) 02/23/2021  ? ?Lab Results  ?Component Value Date  ? TSH 0.996 02/23/2021  ? ? ?Therapeutic Level Labs: ?No results found for: LITHIUM ?No results found for: VALPROATE ?No components found for:  CBMZ ? ?Current Medications: ?Current Outpatient Medications  ?Medication Sig Dispense Refill  ? buPROPion (WELLBUTRIN) 75 MG tablet Take 1 tablet (75 mg total) by mouth daily with breakfast. 30 tablet 1  ? Cholecalciferol (VITAMIN D3) 10 MCG (400 UNIT) tablet Take 400 Units by mouth daily.    ? clobetasol ointment (TEMOVATE) 0.05 % Apply 1 application topically 2 (two) times a week. 60 g 0  ? clonazePAM (KLONOPIN) 0.5 MG tablet Take 0.5-1 tablets (0.25-0.5 mg total) by mouth daily as needed for anxiety. Take as needed for severe anxiety, grief- please limit use 21 tablet 0  ? cyanocobalamin (,VITAMIN B-12,) 1000 MCG/ML injection Inject 1,000 mcg into the muscle once.    ? mirtazapine (REMERON) 7.5 MG tablet TAKE 1 TABLET BY MOUTH AT BEDTIME. 90 tablet 0  ? ondansetron (ZOFRAN) 8 MG tablet Take 1 tablet (8 mg total) by mouth every 8 (eight) hours as needed for nausea or vomiting. 20 tablet 0  ? oxybutynin (DITROPAN) 5 MG tablet TAKE 1 TABLET BY MOUTH TWICE A DAY 180 tablet 0  ? timolol (TIMOPTIC) 0.5 % ophthalmic solution SMARTSIG:In Eye(s)    ? ?No current facility-administered medications for this visit.  ? ? ? ?Musculoskeletal: ?Strength & Muscle Tone: within normal limits ?Gait & Station: normal ?Patient leans: N/A ? ?Psychiatric Specialty Exam: ?Review of Systems  ?Constitutional:  Positive for fatigue.  ?Psychiatric/Behavioral:  Positive for decreased concentration, dysphoric mood and sleep disturbance. The patient is nervous/anxious.   ?All other systems reviewed and are negative.  ?Blood pressure (!) 144/74, pulse (!) 54, height 5\' 2"  (1.575 m), weight 148 lb (67.1 kg).Body mass index is 27.07 kg/m?.  ?General Appearance: Casual  ?Eye Contact:  Fair  ?Speech:   Clear and Coherent  ?Volume:  Normal  ?Mood:  Anxious, Depressed, and Dysphoric  ?Affect:  Tearful  ?Thought Process:  Goal Directed and Descriptions of Associations: Intact  ?Orientation:  Full (Time, Place, and Person)  ?Thought Content: Rumination   ?Suicidal Thoughts:  No  ?Homicidal Thoughts:  No  ?Memory:  Immediate;   Fair ?Recent;   Fair ?Remote;   Fair  ?Judgement:  Fair  ?Insight:  Fair  ?Psychomotor Activity:  Normal  ?Concentration:  Concentration: Fair and Attention Span: Fair  ?Recall:  Fair  ?Fund of Knowledge: Fair  ?Language: Fair  ?Akathisia:  No  ?Handed:  Right  ?AIMS (if indicated): done  ?Assets:  Communication Skills ?Desire for Improvement ?Housing ?Intimacy ?Social Support  ?ADL's:  Intact  ?Cognition: WNL  ?Sleep:   Improving  ? ?Screenings: ?AIMS   ? ?Flowsheet Row Office Visit from 09/23/2021 in  [facility]  ?AIMS Total Score 0  ? ?  ? ?GAD-7   ? ?Flowsheet Row Office Visit from 07/16/2021 in [facility] Video Visit from 06/15/2021 in [facility] Office Visit from 05/25/2021 in [facility] Office Visit from 04/27/2021 in [facility]  ?Total GAD-7 Score 8 12 18 17   ? ?  ? ?PHQ2-9   ? ?Flowsheet Row Office Visit from 09/23/2021 in [facility] Office Visit from 08/12/2021 in [facility] Office Visit from 07/16/2021 in [facility] Video Visit from 06/15/2021 in [facility] Office Visit from 05/25/2021 in [facility]  ?PHQ-2 Total Score 5 4 5 2 6   ?PHQ-9 Total Score 11 12 15 6 18   ? ?  ? ?Flowsheet Row Office Visit from 08/12/2021 in [facility] Office Visit from 07/16/2021 in [facility] Admission (Discharged) from 03/24/2021 in [facility]  ?C-SSRS RISK CATEGORY Low Risk Low Risk No Risk  ? ?   ? ? ? ?Assessment and Plan: Sanaia Jasso is a 76 year old Caucasian female, married, retired, lives in [place] has a history of PTSD, MDD, prolonged grief, multiple medical problems, presented for medication management.  P

## 2021-09-23 NOTE — Telephone Encounter (Signed)
Left voicemail for patient at listed number requesting call back to discuss counseling options outside this practice.  ?

## 2021-09-23 NOTE — Telephone Encounter (Signed)
[facility] Services Coordinator called to discuss a [place] [facility] therapy referral sent by Dr. Shea Evans. Call back number was left on the voicemail. ?

## 2021-09-23 NOTE — Patient Instructions (Addendum)
[facility] ?[address] [place], [place] [address] ?[phone] ? ?[facility] ? [address], [place] [address]  ?Phone: [phone]  ?Bupropion Tablets (Depression/Mood Disorders) ?What is this medication? ?BUPROPION (byoo PROE pee on) treats depression. It increases norepinephrine and dopamine in the brain, hormones that help regulate mood. It belongs to a group of medications called NDRIs. ?This medicine may be used for other purposes; ask your health care provider or pharmacist if you have questions. ?COMMON BRAND NAME(S): Wellbutrin ?What should I tell my care team before I take this medication? ?They need to know if you have any of these conditions: ?An eating disorder, such as anorexia or bulimia ?Bipolar disorder or psychosis ?Diabetes or high blood sugar, treated with medication ?Glaucoma ?Heart disease, previous heart attack, or irregular heart beat ?Head injury or brain tumor ?High blood pressure ?Kidney or liver disease ?Seizures ?Suicidal thoughts or a previous suicide attempt ?Tourette's syndrome ?Weight loss ?An unusual or allergic reaction to bupropion, other medications, foods, dyes, or preservatives ?Pregnant or trying to become pregnant ?Breast-feeding ?How should I use this medication? ?Take this medication by mouth with a glass of water. Follow the directions on the prescription label. You can take it with or without food. If it upsets your stomach, take it with food. Take your medication at regular intervals. Do not take your medication more often than directed. Do not stop taking this medication suddenly except upon the advice of your care team. Stopping this medication too quickly may cause serious side effects or your condition may worsen. ?A special MedGuide will be given to you by the pharmacist with each prescription and refill. Be sure to read this information carefully each time. ?Talk to your care team regarding the use of this  medication in children. Special care may be needed. ?Overdosage: If you think you have taken too much of this medicine contact a poison control center or emergency room at once. ?NOTE: This medicine is only for you. Do not share this medicine with others. ?What if I miss a dose? ?If you miss a dose, take it as soon as you can. If it is less than four hours to your next dose, take only that dose and skip the missed dose. Do not take double or extra doses. ?What may interact with this medication? ?Do not take this medication with any of the following: ?Linezolid ?MAOIs like Azilect, Carbex, Eldepryl, Marplan, Nardil, and Parnate ?Methylene blue (injected into a vein) ?Other medications that contain bupropion like Zyban ?This medication may also interact with the following: ?Alcohol ?Certain medications for anxiety or sleep ?Certain medications for blood pressure like metoprolol, propranolol ?Certain medications for depression or psychotic disturbances ?Certain medications for HIV or AIDS like efavirenz, lopinavir, nelfinavir, ritonavir ?Certain medications for irregular heart beat like propafenone, flecainide ?Certain medications for Parkinson's disease like amantadine, levodopa ?Certain medications for seizures like carbamazepine, phenytoin, phenobarbital ?Cimetidine ?Clopidogrel ?Cyclophosphamide ?Digoxin ?Furazolidone ?Isoniazid ?Nicotine ?Orphenadrine ?Procarbazine ?Steroid medications like prednisone or cortisone ?Stimulant medications for attention disorders, weight loss, or to stay awake ?Tamoxifen ?Theophylline ?Thiotepa ?Ticlopidine ?Tramadol ?Warfarin ?This list may not describe all possible interactions. Give your health care provider a list of all the medicines, herbs, non-prescription drugs, or dietary supplements you use. Also tell them if you smoke, drink alcohol, or use illegal drugs. Some items may interact with your medicine. ?What should I watch for while using this medication? ?Tell your care team  if your symptoms do not get better or if  they get worse. Visit your care team for regular checks on your progress. Because it may take several weeks to see the full effects of this medication, it is important to continue your treatment as prescribed. ?Watch for new or worsening thoughts of suicide or depression. This includes sudden changes in mood, behavior, or thoughts. These changes can happen at any time but are more common in the beginning of treatment or after a change in dose. Call your care team right away if you experience these thoughts or worsening depression. ?Manic episodes may happen in patients with bipolar disorder who take this medication. Watch for changes in feelings or behaviors such as feeling anxious, nervous, agitated, panicky, irritable, hostile, aggressive, impulsive, severely restless, overly excited and hyperactive, or trouble sleeping. These symptoms can happen at anytime but are more common in the beginning of treatment or after a change in dose. Call your care team right away if you notice any of these symptoms. ?This medication may cause serious skin reactions. They can happen weeks to months after starting the medication. Contact your care team right away if you notice fevers or flu-like symptoms with a rash. The rash may be red or purple and then turn into blisters or peeling of the skin. Or, you might notice a red rash with swelling of the face, lips or lymph nodes in your neck or under your arms. ?Avoid drinks that contain alcohol while taking this medication. Drinking large amounts of alcohol, using sleeping or anxiety medications, or quickly stopping the use of these agents while taking this medication may increase your risk for a seizure. ?Do not drive or use heavy machinery until you know how this medication affects you. This medication can impair your ability to perform these tasks. ?Do not take this medication close to bedtime. It may prevent you from sleeping. ?Your mouth  may get dry. Chewing sugarless gum or sucking hard candy, and drinking plenty of water may help. Contact your care team if the problem does not go away or is severe. ?What side effects may I notice from receiving this medication? ?Side effects that you should report to your care team as soon as possible: ?Allergic reactions--skin rash, itching, hives, swelling of the face, lips, tongue, or throat ?Increase in blood pressure ?Mood and behavior changes--anxiety, nervousness, confusion, hallucinations, irritability, hostility, thoughts of suicide or self-harm, worsening mood, feelings of depression ?Redness, blistering, peeling, or loosening of the skin, including inside the mouth ?Seizures ?Sudden eye pain or change in vision such as blurry vision, seeing halos around lights, vision loss ?Side effects that usually do not require medical attention (report to your care team if they continue or are bothersome): ?Constipation ?Dizziness ?Dry mouth ?Loss of appetite ?Nausea ?Tremors or shaking ?Trouble sleeping ?This list may not describe all possible side effects. Call your doctor for medical advice about side effects. You may report side effects to FDA at 1-800-FDA-1088. ?Where should I keep my medication? ?Keep out of the reach of children and pets. ?Store at room temperature between 20 and 25 degrees C (68 and 77 degrees F), away from direct sunlight and moisture. Keep tightly closed. Throw away any unused medication after the expiration date. ?NOTE: This sheet is a summary. It may not cover all possible information. If you have questions about this medicine, talk to your doctor, pharmacist, or health care provider. ?? 2023 Elsevier/Gold Standard (2020-07-29 00:00:00) ? ? ? ? ? ? ? ?

## 2021-09-28 ENCOUNTER — Telehealth (HOSPITAL_COMMUNITY): Payer: Self-pay

## 2021-09-28 NOTE — Telephone Encounter (Signed)
Called to schedule a TMS Consult. Voicemail left with a call back number. Will try again later.  ?

## 2021-10-04 ENCOUNTER — Ambulatory Visit: Payer: Federal, State, Local not specified - PPO | Admitting: Internal Medicine

## 2021-10-11 ENCOUNTER — Ambulatory Visit (INDEPENDENT_AMBULATORY_CARE_PROVIDER_SITE_OTHER): Payer: Federal, State, Local not specified - PPO | Admitting: Physician Assistant

## 2021-10-11 ENCOUNTER — Encounter: Payer: Self-pay | Admitting: Physician Assistant

## 2021-10-11 VITALS — BP 144/85 | HR 56 | Temp 98.3°F | Ht 62.01 in | Wt 147.6 lb

## 2021-10-11 DIAGNOSIS — L237 Allergic contact dermatitis due to plants, except food: Secondary | ICD-10-CM | POA: Diagnosis not present

## 2021-10-11 MED ORDER — PREDNISONE 10 MG PO TABS: ORAL_TABLET | ORAL | 0 refills | Status: DC

## 2021-10-11 NOTE — Patient Instructions (Addendum)
I recommend a second generation antihistamine to assist with your symptoms ?These include Claritin, Allegra, Zyrtec, etc  ?Generics of these are perfectly fine ? ?You can use Calamine lotion and other creams to assist with itching while the steroids are getting into your system ? ?I have sent in a steroid taper for you to take for the next 20 days. It will gradually decrease as you progress through the taper.  ?Try to not to scratch or itch the rash as this could cause an infection ? ?Please let us know if you have any questions or concerns ?

## 2021-10-11 NOTE — Progress Notes (Signed)
? ? ?  ?    Acute Office Visit ? ? ?Patient: Virginia West   DOB: 18-Dec-1945   76 y.o. Female  MRN: [id number] ?Visit Date: 10/11/2021 ? ?Today's healthcare provider: Oswaldo Conroy Verleen Stuckey, PA-C  ?Introduced myself to the patient as a Secondary school teacher and provided education on APPs in clinical practice.  ? ? ?Chief Complaint  ?Patient presents with  ? Poison Ivy  ?  Patient believes she has poison ivy on her b/l arms, on face, ears, and bottom for past week, seems to be spreading.  ? ?Subjective  ?  ?HPI ?HPI   ? ? Poison Ivy   ? Additional comments: Patient believes she has poison ivy on her b/l arms, on face, ears, and bottom for past week, seems to be spreading. ? ?  ?  ?Last edited by Sherolyn Buba, CMA on 10/11/2021 10:39 AM.  ?  ?  ?Reports she has been working out in her yard for the past 2 weeks ?States she noticed the redness and itching about 4 days ago ?Has been using anti itch creams and taking Benadryl at night to assist with sleep.  ?Reports she is mostly concerned about the area around her eyes- she has some swelling and redness under her right eye  ? ? ? ?Medications: ?Outpatient Medications Prior to Visit  ?Medication Sig  ? buPROPion (WELLBUTRIN) 75 MG tablet Take 1 tablet (75 mg total) by mouth daily with breakfast.  ? Cholecalciferol (VITAMIN D3) 10 MCG (400 UNIT) tablet Take 400 Units by mouth daily.  ? clobetasol ointment (TEMOVATE) 0.05 % Apply 1 application topically 2 (two) times a week.  ? clonazePAM (KLONOPIN) 0.5 MG tablet Take 0.5-1 tablets (0.25-0.5 mg total) by mouth daily as needed for anxiety. Take as needed for severe anxiety, grief- please limit use  ? cyanocobalamin (,VITAMIN B-12,) 1000 MCG/ML injection Inject 1,000 mcg into the muscle once.  ? mirtazapine (REMERON) 7.5 MG tablet TAKE 1 TABLET BY MOUTH AT BEDTIME.  ? ondansetron (ZOFRAN) 8 MG tablet Take 1 tablet (8 mg total) by mouth every 8 (eight) hours as needed for nausea or vomiting.  ? oxybutynin (DITROPAN) 5 MG tablet TAKE 1 TABLET BY MOUTH  TWICE A DAY  ? timolol (TIMOPTIC) 0.5 % ophthalmic solution SMARTSIG:In Eye(s)  ? ?No facility-administered medications prior to visit.  ? ? ?Review of Systems  ?Constitutional:  Negative for fatigue and fever.  ?Eyes:  Positive for itching. Negative for photophobia, pain and redness.  ?Respiratory:  Negative for cough and shortness of breath.   ?Cardiovascular:  Negative for chest pain.  ?Skin:  Positive for rash.  ? ? ?  Objective  ?  ?BP (!) 144/85   Pulse (!) 56   Temp 98.3 ?F (36.8 ?C) (Oral)   Ht 5' 2.01" (1.575 m)   Wt 147 lb 9.6 oz (67 kg)   SpO2 98%   BMI 26.99 kg/m?  ? ? ?Physical Exam ?Vitals reviewed.  ?Constitutional:   ?   Appearance: Normal appearance. She is normal weight.  ?HENT:  ?   Head: Normocephalic and atraumatic.  ?   Mouth/Throat:  ?   Lips: Pink. No lesions.  ?   Mouth: Mucous membranes are moist.  ?Eyes:  ?   General: Lids are normal. No allergic shiner. ?   Conjunctiva/sclera: Conjunctivae normal.  ?   Right eye: Right conjunctiva is not injected.  ?   Left eye: Left conjunctiva is not injected.  ?   Pupils: Pupils are  equal, round, and reactive to light.  ?Pulmonary:  ?   Effort: Pulmonary effort is normal.  ?Skin: ?   General: Skin is warm.  ?   Findings: Rash present. Rash is macular.  ? ?    ?   Comments: Isolated patches of erythematous rash along forearms, under right eye and along left lateral/posterior hip   ?Neurological:  ?   Mental Status: She is alert.  ?  ? ? ?No results found for any visits on 10/11/21. ? Assessment & Plan  ?  ? ? ?No follow-ups on file.  ?   ? ?Problem List Items Addressed This Visit   ?None ?Visit Diagnoses   ? ? Poison ivy dermatitis    -  Primary ?Acute, new problem ?Reports itching and erythematous rash dispersed over several areas of her body  ?She is concerned for rash around her eyes and nose ?Symptoms and history are consistent with Rhus- type contact dermatitis  ?Will provided extended steroid taper to prevent further spread and prevent  rebound itching ?Recommend supplementing with 2nd gen antihistamine instead of benadryl to prevent sedation ?Recommend using topical calamine and OTC anti-itch creams to prevent further scratching  ?Follow up as needed for persistent or progressing symptoms  ? Relevant Medications  ? predniSONE (DELTASONE) 10 MG tablet  ? ?  ? ? ? ?No follow-ups on file. ? ? ?I, Leandria Thier E Davonne Baby, PA-C, have reviewed all documentation for this visit. The documentation on 10/11/21 for the exam, diagnosis, procedures, and orders are all accurate and complete. ? ? ?Jung Yurchak, MHS, PA-C ?[facility] ?[facility]  ? ? ? ? ?

## 2021-10-17 ENCOUNTER — Other Ambulatory Visit: Payer: Self-pay | Admitting: Psychiatry

## 2021-10-17 DIAGNOSIS — F332 Major depressive disorder, recurrent severe without psychotic features: Secondary | ICD-10-CM

## 2021-10-18 ENCOUNTER — Other Ambulatory Visit: Payer: Self-pay | Admitting: Obstetrics and Gynecology

## 2021-10-18 DIAGNOSIS — L900 Lichen sclerosus et atrophicus: Secondary | ICD-10-CM

## 2021-10-20 ENCOUNTER — Ambulatory Visit (INDEPENDENT_AMBULATORY_CARE_PROVIDER_SITE_OTHER): Payer: Federal, State, Local not specified - PPO

## 2021-10-20 DIAGNOSIS — E538 Deficiency of other specified B group vitamins: Secondary | ICD-10-CM

## 2021-10-20 MED ORDER — CYANOCOBALAMIN 1000 MCG/ML IJ SOLN
1000.0000 ug | Freq: Once | INTRAMUSCULAR | Status: AC
  Administered 2021-10-20: 1000 ug via INTRAMUSCULAR

## 2021-10-27 ENCOUNTER — Encounter: Payer: Self-pay | Admitting: Internal Medicine

## 2021-10-27 ENCOUNTER — Ambulatory Visit: Payer: Federal, State, Local not specified - PPO | Admitting: Internal Medicine

## 2021-10-27 VITALS — BP 111/71 | HR 72 | Temp 97.9°F | Ht 62.01 in | Wt 146.2 lb

## 2021-10-27 DIAGNOSIS — M25551 Pain in right hip: Secondary | ICD-10-CM

## 2021-10-27 DIAGNOSIS — R21 Rash and other nonspecific skin eruption: Secondary | ICD-10-CM

## 2021-10-27 MED ORDER — FEXOFENADINE HCL 180 MG PO TABS: 180.0000 mg | ORAL_TABLET | Freq: Every day | ORAL | 1 refills | Status: DC

## 2021-10-27 MED ORDER — CLOTRIMAZOLE-BETAMETHASONE 1-0.05 % EX CREA: 1.0000 "application " | TOPICAL_CREAM | Freq: Every day | CUTANEOUS | 1 refills | Status: DC

## 2021-10-27 MED ORDER — FLUCONAZOLE 150 MG PO TABS: 150.0000 mg | ORAL_TABLET | Freq: Once | ORAL | 0 refills | Status: AC

## 2021-10-27 NOTE — Progress Notes (Signed)
BP 111/71   Pulse 72   Temp 97.9 F (36.6 C) (Oral)   Ht 5' 2.01" (1.575 m)   Wt 146 lb 3.2 oz (66.3 kg)   SpO2 96%   BMI 26.73 kg/m    Subjective:    Patient ID: Virginia West, female    DOB: 12/12/1945, 76 y.o.   MRN: [id number]  Chief Complaint  Patient presents with   Hip Pain    For past 2 weeks   Welts    Patient states that she has welts all over her arms and on her thighs and right hip for past 2 weeks. Was seen on 5/15 for Poison Ivy with Junie Panning    HPI: Virginia West is a 76 y.o. female  Has had poison ivy x 2 weeks go, better on arms still present on her lower ext.   Hip Pain  The incident occurred more than 1 week ago (has had right hip pain she saw a pcp who told her she had a problem with er sacral area and had PT. has been helogin take care of her husband needs to help with ADLs and who cannot get up from sitting to standing without her help). The pain is at a severity of 6/10. The pain has been Fluctuating since onset. Pertinent negatives include no loss of motion, loss of sensation, muscle weakness, numbness or tingling.  Rash This is a new (has ring shaped lesions on the left thigh pt c/o welps.) problem. Episode onset: ho poison ivy was rx with medrol dose pak. still itchy and has brusing from such.   Chief Complaint  Patient presents with   Hip Pain    For past 2 weeks   Welts    Patient states that she has welts all over her arms and on her thighs and right hip for past 2 weeks. Was seen on 5/15 for Poison Ivy with Erin    Relevant past medical, surgical, family and social history reviewed and updated as indicated. Interim medical history since our last visit reviewed. Allergies and medications reviewed and updated.  Review of Systems  Skin:  Positive for rash.  Neurological:  Negative for tingling and numbness.   Per HPI unless specifically indicated above     Objective:    BP 111/71   Pulse 72   Temp 97.9 F (36.6 C) (Oral)   Ht 5' 2.01"  (1.575 m)   Wt 146 lb 3.2 oz (66.3 kg)   SpO2 96%   BMI 26.73 kg/m   Wt Readings from Last 3 Encounters:  10/27/21 146 lb 3.2 oz (66.3 kg)  10/11/21 147 lb 9.6 oz (67 kg)  05/25/21 140 lb 6.4 oz (63.7 kg)    Physical Exam Constitutional:      Appearance: Normal appearance.  Skin:    Coloration: Skin is not jaundiced or pale.     Findings: Lesion and rash present. No bruising or erythema.     Comments: Circular / ring shaped lesions on the upper thigh of left   Neurological:     Mental Status: She is alert.  Psychiatric:        Mood and Affect: Mood normal.        Behavior: Behavior normal.        Thought Content: Thought content normal.        Judgment: Judgment normal.    Results for orders placed or performed in visit on 02/23/21  Microscopic Examination   BLD  Result Value Ref  Range   WBC, UA 0-5 0 - 5 /hpf   RBC None seen 0 - 2 /hpf   Epithelial Cells (non renal) 0-10 0 - 10 /hpf   Mucus, UA Present (A) Not Estab.   Bacteria, UA Few (A) None seen/Few  Vitamin B12  Result Value Ref Range   Vitamin B-12 228 (L) 232 - 1,245 pg/mL  CBC with Differential/Platelet  Result Value Ref Range   WBC 9.5 3.4 - 10.8 x10E3/uL   RBC 4.92 3.77 - 5.28 x10E6/uL   Hemoglobin 14.1 11.1 - 15.9 g/dL   Hematocrit 42.6 34.0 - 46.6 %   MCV 87 79 - 97 fL   MCH 28.7 26.6 - 33.0 pg   MCHC 33.1 31.5 - 35.7 g/dL   RDW 14.1 11.7 - 15.4 %   Platelets 287 150 - 450 x10E3/uL   Neutrophils 61 Not Estab. %   Lymphs 27 Not Estab. %   Monocytes 7 Not Estab. %   Eos 4 Not Estab. %   Basos 1 Not Estab. %   Neutrophils Absolute 5.7 1.4 - 7.0 x10E3/uL   Lymphocytes Absolute 2.5 0.7 - 3.1 x10E3/uL   Monocytes Absolute 0.7 0.1 - 0.9 x10E3/uL   EOS (ABSOLUTE) 0.4 0.0 - 0.4 x10E3/uL   Basophils Absolute 0.1 0.0 - 0.2 x10E3/uL   Immature Granulocytes 0 Not Estab. %   Immature Grans (Abs) 0.0 0.0 - 0.1 x10E3/uL  Comprehensive metabolic panel  Result Value Ref Range   Glucose 82 70 - 99 mg/dL   BUN  18 8 - 27 mg/dL   Creatinine, Ser 1.17 (H) 0.57 - 1.00 mg/dL   eGFR 49 (L) >59 mL/min/1.73   BUN/Creatinine Ratio 15 12 - 28   Sodium 138 134 - 144 mmol/L   Potassium 4.5 3.5 - 5.2 mmol/L   Chloride 100 96 - 106 mmol/L   CO2 18 (L) 20 - 29 mmol/L   Calcium 10.1 8.7 - 10.3 mg/dL   Total Protein 8.3 6.0 - 8.5 g/dL   Albumin 4.8 (H) 3.7 - 4.7 g/dL   Globulin, Total 3.5 1.5 - 4.5 g/dL   Albumin/Globulin Ratio 1.4 1.2 - 2.2   Bilirubin Total 0.2 0.0 - 1.2 mg/dL   Alkaline Phosphatase 83 44 - 121 IU/L   AST 21 0 - 40 IU/L   ALT 13 0 - 32 IU/L  Lipid panel  Result Value Ref Range   Cholesterol, Total 222 (H) 100 - 199 mg/dL   Triglycerides 204 (H) 0 - 149 mg/dL   HDL 60 >39 mg/dL   VLDL Cholesterol Cal 36 5 - 40 mg/dL   LDL Chol Calc (NIH) 126 (H) 0 - 99 mg/dL   Chol/HDL Ratio 3.7 0.0 - 4.4 ratio  TSH  Result Value Ref Range   TSH 0.996 0.450 - 4.500 uIU/mL  Vitamin D 1,25 dihydroxy  Result Value Ref Range   Vitamin D 1, 25 (OH)2 Total 35 pg/mL   Vitamin D2 1, 25 (OH)2 <10 pg/mL   Vitamin D3 1, 25 (OH)2 32 pg/mL  Bayer DCA Hb A1c Waived (STAT)  Result Value Ref Range   HB A1C (BAYER DCA - WAIVED) 5.3 4.8 - 5.6 %  CBC With Differential/Platelet  Result Value Ref Range   WBC 10.0 3.4 - 10.8 x10E3/uL   RBC 5.00 3.77 - 5.28 x10E6/uL   Hemoglobin 14.5 11.1 - 15.9 g/dL   Hematocrit 42.9 34.0 - 46.6 %   MCV 86 79 - 97 fL   MCH 29.0 26.6 -  33.0 pg   MCHC 33.8 31.5 - 35.7 g/dL   RDW 15.2 11.7 - 15.4 %   Platelets 265 150 - 450 x10E3/uL   Neutrophils 68 Not Estab. %   Lymphs 26 Not Estab. %   MID 6 Not Estab. %   Neutrophils Absolute 6.8 1.4 - 7.0 x10E3/uL   Lymphocytes Absolute 2.6 0.7 - 3.1 x10E3/uL   MID (Absolute) 0.6 0.1 - 1.6 X10E3/uL  Urinalysis, Routine w reflex microscopic  Result Value Ref Range   Specific Gravity, UA 1.015 1.005 - 1.030   pH, UA 5.0 5.0 - 7.5   Color, UA Yellow Yellow   Appearance Ur Clear Clear   Leukocytes,UA 1+ (A) Negative   Protein,UA  Negative Negative/Trace   Glucose, UA Negative Negative   Ketones, UA Negative Negative   RBC, UA Negative Negative   Bilirubin, UA Negative Negative   Urobilinogen, Ur 0.2 0.2 - 1.0 mg/dL   Nitrite, UA Negative Negative   Microscopic Examination See below:         Current Outpatient Medications:    buPROPion (WELLBUTRIN) 75 MG tablet, TAKE 1 TABLET BY MOUTH DAILY WITH BREAKFAST., Disp: 90 tablet, Rfl: 0   Cholecalciferol (VITAMIN D3) 10 MCG (400 UNIT) tablet, Take 400 Units by mouth daily., Disp: , Rfl:    clobetasol ointment (TEMOVATE) 5.99 %, APPLY 1 APPLICATION TOPICALLY 2 (TWO) TIMES A WEEK., Disp: 60 g, Rfl: 0   clonazePAM (KLONOPIN) 0.5 MG tablet, Take 0.5-1 tablets (0.25-0.5 mg total) by mouth daily as needed for anxiety. Take as needed for severe anxiety, grief- please limit use, Disp: 21 tablet, Rfl: 0   cyanocobalamin (,VITAMIN B-12,) 1000 MCG/ML injection, Inject 1,000 mcg into the muscle once., Disp: , Rfl:    mirtazapine (REMERON) 7.5 MG tablet, TAKE 1 TABLET BY MOUTH AT BEDTIME., Disp: 90 tablet, Rfl: 0   ondansetron (ZOFRAN) 8 MG tablet, Take 1 tablet (8 mg total) by mouth every 8 (eight) hours as needed for nausea or vomiting., Disp: 20 tablet, Rfl: 0   oxybutynin (DITROPAN) 5 MG tablet, TAKE 1 TABLET BY MOUTH TWICE A DAY, Disp: 180 tablet, Rfl: 0   timolol (TIMOPTIC) 0.5 % ophthalmic solution, SMARTSIG:In Eye(s), Disp: , Rfl:     Assessment & Plan:  Rash much improved on diflucan and clotrimazole for such Consider derm referral if persist.s  Hip pain: Will check xrays for such Back pain will check xrays of the L spine.   Consider PT / ortho referral.

## 2021-10-28 ENCOUNTER — Ambulatory Visit
Admission: RE | Admit: 2021-10-28 | Discharge: 2021-10-28 | Disposition: A | Discharge status: Home / Self Care | Payer: Federal, State, Local not specified - PPO | Source: Ambulatory Visit | Attending: Internal Medicine | Admitting: Internal Medicine

## 2021-10-28 ENCOUNTER — Ambulatory Visit
Admission: RE | Admit: 2021-10-28 | Discharge: 2021-10-28 | Disposition: A | Discharge status: Home / Self Care | Payer: Federal, State, Local not specified - PPO | Attending: Internal Medicine | Admitting: Internal Medicine

## 2021-10-28 DIAGNOSIS — M25551 Pain in right hip: Secondary | ICD-10-CM | POA: Diagnosis not present

## 2021-10-28 NOTE — Progress Notes (Signed)
Pl let pt knoe has -- Degenerative changes without acute findings If she would like can refer her to ortho.

## 2021-11-01 ENCOUNTER — Ambulatory Visit: Payer: Federal, State, Local not specified - PPO | Admitting: Physician Assistant

## 2021-11-02 ENCOUNTER — Ambulatory Visit: Payer: Federal, State, Local not specified - PPO | Admitting: Internal Medicine

## 2021-11-02 ENCOUNTER — Encounter: Payer: Self-pay | Admitting: Internal Medicine

## 2021-11-02 ENCOUNTER — Ambulatory Visit (INDEPENDENT_AMBULATORY_CARE_PROVIDER_SITE_OTHER): Payer: Federal, State, Local not specified - PPO | Admitting: Internal Medicine

## 2021-11-02 VITALS — BP 126/75 | HR 53 | Temp 97.9°F | Ht 62.01 in | Wt 147.6 lb

## 2021-11-02 DIAGNOSIS — R21 Rash and other nonspecific skin eruption: Secondary | ICD-10-CM

## 2021-11-02 DIAGNOSIS — M25559 Pain in unspecified hip: Secondary | ICD-10-CM | POA: Diagnosis not present

## 2021-11-04 ENCOUNTER — Ambulatory Visit: Payer: Federal, State, Local not specified - PPO | Admitting: Psychiatry

## 2021-11-04 ENCOUNTER — Encounter: Payer: Self-pay | Admitting: Psychiatry

## 2021-11-04 VITALS — BP 129/59 | HR 55 | Temp 97.1°F | Wt 148.8 lb

## 2021-11-04 DIAGNOSIS — F4381 Prolonged grief disorder: Secondary | ICD-10-CM

## 2021-11-04 DIAGNOSIS — F4310 Post-traumatic stress disorder, unspecified: Secondary | ICD-10-CM

## 2021-11-04 DIAGNOSIS — F332 Major depressive disorder, recurrent severe without psychotic features: Secondary | ICD-10-CM

## 2021-11-04 MED ORDER — MIRTAZAPINE 7.5 MG PO TABS
7.5000 mg | ORAL_TABLET | Freq: Every day | ORAL | 0 refills | Status: DC
Start: 2021-11-04 — End: 2022-01-04

## 2021-11-04 NOTE — Progress Notes (Signed)
BH MD OP Progress Note  11/04/2021 6:15 PM Virginia IgoSusan Fetty  MRN:  [id number]  Chief Complaint:  Chief Complaint  Patient presents with   Follow-up: 76 year old Caucasian female with history of PTSD, MDD, prolonged grief disorder, presented for medication management.   HPI: Virginia West is a 76 year old Caucasian female, married, lives in [place], has a history of MDD, PTSD, prolonged grief disorder, multiple medical problems including urinary incontinence, pancreatitis, vitamin B12 deficiency was evaluated in office today.  Patient today reports she is currently tolerating the Wellbutrin well.  Since being on the Wellbutrin she has noticed mood symptoms as improving.  Patient reports she has made a friend in her community.  She also adopted this dog who belonged to a neighbor but they could not take care of him.  Patient reports she also likes the fact that the weather is getting better and warmer.  She has been spending a lot of time in her garden doing yard work.  That has been therapeutic for her as well.  She reports she had a skin rash recently and was prescribed prednisone, Diflucan as well as Allegra.  She continues to be on the Allegra, about to complete the course.  She reports her skin rash has resolved.   Patient reports she is sleeping better, unknown if this is a combination of her mood symptoms getting better as well as being on medications like Allegra which could make her drowsy.  She is compliant on her medications.  Denies side effects.  Patient reports she was unable to connect with therapist Ms. Felecia Janina Thompson.  She has upcoming appointment with Ms. Christina Hussami-looks forward to keeping it.  Denies any suicidality, homicidality or perceptual disturbances.  Denies any other concerns today.  Visit Diagnosis:    ICD-10-CM   1. PTSD (post-traumatic stress disorder)  F43.10 mirtazapine (REMERON) 7.5 MG tablet    2. Severe episode of recurrent major depressive disorder,  without psychotic features (HCC)  F33.2     3. Prolonged grief disorder  F43.81 mirtazapine (REMERON) 7.5 MG tablet      Past Psychiatric History: Reviewed past psychiatric history from progress note on 07/16/2021.  Past trials of Celexa-GI side effects.  Past Medical History:  Past Medical History:  Diagnosis Date   Asthma    Pancreatitis     Past Surgical History:  Procedure Laterality Date   ABDOMINAL HYSTERECTOMY     APPENDECTOMY     CHOLECYSTECTOMY     COLONOSCOPY WITH PROPOFOL N/A 03/24/2021   Procedure: COLONOSCOPY WITH PROPOFOL;  Surgeon: Toney ReilVanga, Rohini Reddy, MD;  Location: [facility] ENDOSCOPY;  Service: Gastroenterology;  Laterality: N/A;    Family Psychiatric History: Reviewed family psychiatric history from progress note on 07/16/2021.  Family History:  Family History  Problem Relation Age of Onset   Heart disease Mother    Heart disease Father    Cancer Brother    Breast cancer Maternal Aunt [phone]   Cancer Son    Depression Son     Social History: Reviewed social history from progress note on 07/16/2021. Social History   Socioeconomic History   Marital status: Married    Spouse name: Not on file   Number of children: 3   Years of education: Not on file   Highest education level: Some college, no degree  Occupational History   Occupation: retired  Tobacco Use   Smoking status: Never    Passive exposure: Past   Smokeless tobacco: Never  Vaping Use   Vaping Use:  Never used  Substance and Sexual Activity   Alcohol use: Not Currently   Drug use: Never   Sexual activity: Not Currently  Other Topics Concern   Not on file  Social History Narrative   Not on file   Social Determinants of Health   Financial Resource Strain: Not on file  Food Insecurity: Not on file  Transportation Needs: Not on file  Physical Activity: Not on file  Stress: Not on file  Social Connections: Not on file    Allergies: No Known Allergies  Metabolic Disorder Labs: Lab  Results  Component Value Date   HGBA1C 5.3 02/23/2021   No results found for: "PROLACTIN" Lab Results  Component Value Date   CHOL 222 (H) 02/23/2021   TRIG 204 (H) 02/23/2021   HDL 60 02/23/2021   CHOLHDL 3.7 02/23/2021   LDLCALC 126 (H) 02/23/2021   Lab Results  Component Value Date   TSH 0.996 02/23/2021    Therapeutic Level Labs: No results found for: "LITHIUM" No results found for: "VALPROATE" No results found for: "CBMZ"  Current Medications: Current Outpatient Medications  Medication Sig Dispense Refill   buPROPion (WELLBUTRIN) 75 MG tablet TAKE 1 TABLET BY MOUTH DAILY WITH BREAKFAST. 90 tablet 0   Cholecalciferol (VITAMIN D3) 10 MCG (400 UNIT) tablet Take 400 Units by mouth daily.     clobetasol ointment (TEMOVATE) 0.05 % APPLY 1 APPLICATION TOPICALLY 2 (TWO) TIMES A WEEK. 60 g 0   clonazePAM (KLONOPIN) 0.5 MG tablet Take 0.5-1 tablets (0.25-0.5 mg total) by mouth daily as needed for anxiety. Take as needed for severe anxiety, grief- please limit use 21 tablet 0   clotrimazole-betamethasone (LOTRISONE) cream Apply 1 application. topically daily. 30 g 1   cyanocobalamin (,VITAMIN B-12,) 1000 MCG/ML injection Inject 1,000 mcg into the muscle once.     fexofenadine (ALLEGRA ALLERGY) 180 MG tablet Take 1 tablet (180 mg total) by mouth daily. 10 tablet 1   fluconazole (DIFLUCAN) 150 MG tablet Take 150 mg by mouth once.     oxybutynin (DITROPAN) 5 MG tablet TAKE 1 TABLET BY MOUTH TWICE A DAY 180 tablet 0   timolol (TIMOPTIC) 0.5 % ophthalmic solution SMARTSIG:In Eye(s)     mirtazapine (REMERON) 7.5 MG tablet Take 1 tablet (7.5 mg total) by mouth at bedtime. 90 tablet 0   No current facility-administered medications for this visit.     Musculoskeletal: Strength & Muscle Tone: within normal limits Gait & Station: normal Patient leans: Right  Psychiatric Specialty Exam: Review of Systems  Psychiatric/Behavioral:  Positive for dysphoric mood and sleep disturbance.    All other systems reviewed and are negative.   Blood pressure (!) 129/59, pulse (!) 55, temperature (!) 97.1 F (36.2 C), temperature source Temporal, weight 148 lb 12.8 oz (67.5 kg).Body mass index is 27.21 kg/m.  General Appearance: Casual  Eye Contact:  Fair  Speech:  Clear and Coherent  Volume:  Normal  Mood:  Depressed  Affect:  Congruent  Thought Process:  Goal Directed and Descriptions of Associations: Intact  Orientation:  Full (Time, Place, and Person)  Thought Content: Logical   Suicidal Thoughts:  No  Homicidal Thoughts:  No  Memory:  Immediate;   Fair Recent;   Fair Remote;   Fair  Judgement:  Fair  Insight:  Fair  Psychomotor Activity:  Normal  Concentration:  Concentration: Fair and Attention Span: Fair  Recall:  [employer] of Knowledge: Fair  Language: Fair  Akathisia:  No  Handed:  Right  AIMS (if indicated): done  Assets:  Communication Skills Desire for Improvement Housing Social Support  ADL's:  Intact  Cognition: WNL  Sleep:   improving   Screenings: AIMS    Flowsheet Row Office Visit from 11/04/2021 in [facility] Office Visit from 09/23/2021 in [facility]  AIMS Total Score 0 0      GAD-7    Flowsheet Row Office Visit from 11/02/2021 in [facility] Office Visit from 10/27/2021 in [facility] Office Visit from 07/16/2021 in [facility] Video Visit from 06/15/2021 in [facility] Office Visit from 05/25/2021 in [facility]  Total GAD-7 Score 7 14 8 12 18       PHQ2-9    Flowsheet Row Office Visit from 11/04/2021 in [facility] Office Visit from 11/02/2021 in [facility] Office Visit from 10/27/2021 in [facility] Office Visit from 09/23/2021 in [facility] Office Visit from 08/12/2021 in [facility]  PHQ-2  Total Score 2 2 2 5 4   PHQ-9 Total Score 4 4 5 11 12       Flowsheet Row Office Visit from 11/04/2021 in [facility] Office Visit from 08/12/2021 in [facility] Office Visit from 07/16/2021 in [facility]  C-SSRS RISK CATEGORY Low Risk Low Risk Low Risk        Assessment and Plan: Zilda No is a 76 year old Caucasian female, married, retired, lives in [place], has a history of PTSD, MDD, prolonged grief, multiple medical problems, presented for medication management.  Patient is currently improving with regards to her mood on the current medication regimen however will need psychotherapy sessions, patient is motivated to start therapy.  Plan PTSD-improving Mirtazapine 7.5 mg p.o. nightly Patient will need trauma focused therapy-patient was provided information for Ms. Virginia Igo well as 61 communicated with her.  Patient however reports she has been playing phone tag with Ms. [place]. She however has upcoming appointment with Ms. Christina Hussami   MDD-improving Wellbutrin 75 mg p.o. daily in the morning Mirtazapine 7.5 mg p.o. nightly Referred for CBT. Referred for TMS in the past-pending.  Prolonged grief-improving Referral for CBT Klonopin 0.5 mg as needed for severe anxiety attacks.  Follow-up in clinic in 7 to 8 weeks or sooner if needed.    This note was generated in part or whole with voice recognition software. Voice recognition is usually quite accurate but there are transcription errors that can and very often do occur. I apologize for any typographical errors that were not detected and corrected.    Horald Pollen, MD 11/04/2021, 6:15 PM

## 2021-11-04 NOTE — Patient Instructions (Signed)
Felecia Jan [phone]

## 2021-11-11 ENCOUNTER — Ambulatory Visit: Payer: Federal, State, Local not specified - PPO | Admitting: Licensed Clinical Social Worker

## 2021-11-22 ENCOUNTER — Ambulatory Visit (INDEPENDENT_AMBULATORY_CARE_PROVIDER_SITE_OTHER): Payer: Federal, State, Local not specified - PPO

## 2021-11-22 ENCOUNTER — Ambulatory Visit: Payer: Federal, State, Local not specified - PPO | Admitting: Orthopaedic Surgery

## 2021-11-22 DIAGNOSIS — E538 Deficiency of other specified B group vitamins: Secondary | ICD-10-CM

## 2021-11-22 MED ORDER — CYANOCOBALAMIN 1000 MCG/ML IJ SOLN
1000.0000 ug | Freq: Once | INTRAMUSCULAR | Status: AC
  Administered 2021-11-22: 1000 ug via INTRAMUSCULAR

## 2021-11-25 NOTE — Progress Notes (Signed)
BP 126/75   Pulse (!) 53   Temp 97.9 F (36.6 C) (Oral)   Ht 5' 2.01" (1.575 m)   Wt 147 lb 9.6 oz (67 kg)   SpO2 98%   BMI 26.99 kg/m    Subjective:    Patient ID: Virginia West, female    DOB: 12/28/1945, 76 y.o.   MRN: [id number]  Chief Complaint  Patient presents with  . Poison Ivy  . Xray hip    F/U, would like to see an Orthopeadic    HPI: Virginia West is a 76 y.o. female  [employer] Complaint  Patient presents with  . Poison Ivy  . Xray hip    F/U, would like to see an Orthopeadic    Relevant past medical, surgical, family and social history reviewed and updated as indicated. Interim medical history since our last visit reviewed. Allergies and medications reviewed and updated.  Review of Systems  Per HPI unless specifically indicated above     Objective:    BP 126/75   Pulse (!) 53   Temp 97.9 F (36.6 C) (Oral)   Ht 5' 2.01" (1.575 m)   Wt 147 lb 9.6 oz (67 kg)   SpO2 98%   BMI 26.99 kg/m   Wt Readings from Last 3 Encounters:  11/02/21 147 lb 9.6 oz (67 kg)  10/27/21 146 lb 3.2 oz (66.3 kg)  10/11/21 147 lb 9.6 oz (67 kg)    Physical Exam Constitutional:      Appearance: Normal appearance. She is normal weight.  Musculoskeletal:        General: Tenderness present. No swelling, deformity or signs of injury.     Right lower leg: No edema.     Left lower leg: No edema.  Skin:    Coloration: Skin is not jaundiced or pale.     Findings: No erythema, lesion or rash.  Neurological:     Mental Status: She is alert.   Results for orders placed or performed in visit on 02/23/21  Microscopic Examination   BLD  Result Value Ref Range   WBC, UA 0-5 0 - 5 /hpf   RBC, Urine None seen 0 - 2 /hpf   Epithelial Cells (non renal) 0-10 0 - 10 /hpf   Mucus, UA Present (A) Not Estab.   Bacteria, UA Few (A) None seen/Few  Vitamin B12  Result Value Ref Range   Vitamin B-12 228 (L) 232 - 1,245 pg/mL  CBC with Differential/Platelet  Result  Value Ref Range   WBC 9.5 3.4 - 10.8 x10E3/uL   RBC 4.92 3.77 - 5.28 x10E6/uL   Hemoglobin 14.1 11.1 - 15.9 g/dL   Hematocrit 42.6 34.0 - 46.6 %   MCV 87 79 - 97 fL   MCH 28.7 26.6 - 33.0 pg   MCHC 33.1 31.5 - 35.7 g/dL   RDW 14.1 11.7 - 15.4 %   Platelets 287 150 - 450 x10E3/uL   Neutrophils 61 Not Estab. %   Lymphs 27 Not Estab. %   Monocytes 7 Not Estab. %   Eos 4 Not Estab. %   Basos 1 Not Estab. %   Neutrophils Absolute 5.7 1.4 - 7.0 x10E3/uL   Lymphocytes Absolute 2.5 0.7 - 3.1 x10E3/uL   Monocytes Absolute 0.7 0.1 - 0.9 x10E3/uL   EOS (ABSOLUTE) 0.4 0.0 - 0.4 x10E3/uL   Basophils Absolute 0.1 0.0 - 0.2 x10E3/uL   Immature Granulocytes 0 Not Estab. %   Immature Grans (Abs) 0.0  0.0 - 0.1 x10E3/uL  Comprehensive metabolic panel  Result Value Ref Range   Glucose 82 70 - 99 mg/dL   BUN 18 8 - 27 mg/dL   Creatinine, Ser 1.17 (H) 0.57 - 1.00 mg/dL   eGFR 49 (L) >59 mL/min/1.73   BUN/Creatinine Ratio 15 12 - 28   Sodium 138 134 - 144 mmol/L   Potassium 4.5 3.5 - 5.2 mmol/L   Chloride 100 96 - 106 mmol/L   CO2 18 (L) 20 - 29 mmol/L   Calcium 10.1 8.7 - 10.3 mg/dL   Total Protein 8.3 6.0 - 8.5 g/dL   Albumin 4.8 (H) 3.7 - 4.7 g/dL   Globulin, Total 3.5 1.5 - 4.5 g/dL   Albumin/Globulin Ratio 1.4 1.2 - 2.2   Bilirubin Total 0.2 0.0 - 1.2 mg/dL   Alkaline Phosphatase 83 44 - 121 IU/L   AST 21 0 - 40 IU/L   ALT 13 0 - 32 IU/L  Lipid panel  Result Value Ref Range   Cholesterol, Total 222 (H) 100 - 199 mg/dL   Triglycerides 204 (H) 0 - 149 mg/dL   HDL 60 >39 mg/dL   VLDL Cholesterol Cal 36 5 - 40 mg/dL   LDL Chol Calc (NIH) 126 (H) 0 - 99 mg/dL   Chol/HDL Ratio 3.7 0.0 - 4.4 ratio  TSH  Result Value Ref Range   TSH 0.996 0.450 - 4.500 uIU/mL  Vitamin D 1,25 dihydroxy  Result Value Ref Range   Vitamin D 1, 25 (OH)2 Total 35 pg/mL   Vitamin D2 1, 25 (OH)2 <10 pg/mL   Vitamin D3 1, 25 (OH)2 32 pg/mL  Bayer DCA Hb A1c Waived (STAT)  Result Value Ref Range   HB A1C  (BAYER DCA - WAIVED) 5.3 4.8 - 5.6 %  CBC With Differential/Platelet  Result Value Ref Range   WBC 10.0 3.4 - 10.8 x10E3/uL   RBC 5.00 3.77 - 5.28 x10E6/uL   Hemoglobin 14.5 11.1 - 15.9 g/dL   Hematocrit 42.9 34.0 - 46.6 %   MCV 86 79 - 97 fL   MCH 29.0 26.6 - 33.0 pg   MCHC 33.8 31.5 - 35.7 g/dL   RDW 15.2 11.7 - 15.4 %   Platelets 265 150 - 450 x10E3/uL   Neutrophils 68 Not Estab. %   Lymphs 26 Not Estab. %   MID 6 Not Estab. %   Neutrophils Absolute 6.8 1.4 - 7.0 x10E3/uL   Lymphocytes Absolute 2.6 0.7 - 3.1 x10E3/uL   MID (Absolute) 0.6 0.1 - 1.6 X10E3/uL  Urinalysis, Routine w reflex microscopic  Result Value Ref Range   Specific Gravity, UA 1.015 1.005 - 1.030   pH, UA 5.0 5.0 - 7.5   Color, UA Yellow Yellow   Appearance Ur Clear Clear   Leukocytes,UA 1+ (A) Negative   Protein,UA Negative Negative/Trace   Glucose, UA Negative Negative   Ketones, UA Negative Negative   RBC, UA Negative Negative   Bilirubin, UA Negative Negative   Urobilinogen, Ur 0.2 0.2 - 1.0 mg/dL   Nitrite, UA Negative Negative   Microscopic Examination See below:         Current Outpatient Medications:  .  buPROPion (WELLBUTRIN) 75 MG tablet, TAKE 1 TABLET BY MOUTH DAILY WITH BREAKFAST., Disp: 90 tablet, Rfl: 0 .  Cholecalciferol (VITAMIN D3) 10 MCG (400 UNIT) tablet, Take 400 Units by mouth daily., Disp: , Rfl:  .  clobetasol ointment (TEMOVATE) 7.67 %, APPLY 1 APPLICATION TOPICALLY 2 (TWO) TIMES A WEEK.,  Disp: 60 g, Rfl: 0 .  clonazePAM (KLONOPIN) 0.5 MG tablet, Take 0.5-1 tablets (0.25-0.5 mg total) by mouth daily as needed for anxiety. Take as needed for severe anxiety, grief- please limit use, Disp: 21 tablet, Rfl: 0 .  clotrimazole-betamethasone (LOTRISONE) cream, Apply 1 application. topically daily., Disp: 30 g, Rfl: 1 .  cyanocobalamin (,VITAMIN B-12,) 1000 MCG/ML injection, Inject 1,000 mcg into the muscle once., Disp: , Rfl:  .  fexofenadine (ALLEGRA ALLERGY) 180 MG tablet, Take 1  tablet (180 mg total) by mouth daily., Disp: 10 tablet, Rfl: 1 .  oxybutynin (DITROPAN) 5 MG tablet, TAKE 1 TABLET BY MOUTH TWICE A DAY, Disp: 180 tablet, Rfl: 0 .  timolol (TIMOPTIC) 0.5 % ophthalmic solution, SMARTSIG:In Eye(s), Disp: , Rfl:  .  fluconazole (DIFLUCAN) 150 MG tablet, Take 150 mg by mouth once., Disp: , Rfl:  .  mirtazapine (REMERON) 7.5 MG tablet, Take 1 tablet (7.5 mg total) by mouth at bedtime., Disp: 90 tablet, Rfl: 0    Assessment & Plan:  Hip pain will refer to ortho.   Problem List Items Addressed This Visit       Musculoskeletal and Integument   Rash     Other   Hip pain - Primary   Relevant Orders   Ambulatory referral to Orthopedic Surgery     Orders Placed This Encounter  Procedures  . Ambulatory referral to Orthopedic Surgery     No orders of the defined types were placed in this encounter.    Follow up plan: No follow-ups on file.

## 2021-12-18 ENCOUNTER — Telehealth (HOSPITAL_COMMUNITY): Payer: Self-pay | Admitting: Psychiatry

## 2021-12-18 NOTE — Telephone Encounter (Signed)
D:  Placed call to pt re: TMS (following up on a call(s) Bonney Roussel (previous Control and instrumentation engineer) had made; but there was no answer.  A:  Left pt a vm requesting that she call the interim coordinator to inform her if she is still interested or if she would like her name taken off the call list.  Inform Dr. Elna Breslow and [employer] team.

## 2021-12-21 ENCOUNTER — Other Ambulatory Visit: Payer: Self-pay

## 2021-12-21 DIAGNOSIS — E538 Deficiency of other specified B group vitamins: Secondary | ICD-10-CM

## 2021-12-21 MED ORDER — CYANOCOBALAMIN 1000 MCG/ML IJ SOLN
1000.0000 ug | Freq: Once | INTRAMUSCULAR | Status: AC
  Administered 2021-12-22: 1000 ug via INTRAMUSCULAR

## 2021-12-22 ENCOUNTER — Ambulatory Visit (INDEPENDENT_AMBULATORY_CARE_PROVIDER_SITE_OTHER): Payer: Federal, State, Local not specified - PPO

## 2021-12-22 DIAGNOSIS — E538 Deficiency of other specified B group vitamins: Secondary | ICD-10-CM

## 2021-12-22 NOTE — Progress Notes (Signed)
Patient presents today for B-12 Deficiency , received B-12 injection in to Left deltoid patient tolerated well.

## 2021-12-28 ENCOUNTER — Encounter: Payer: Self-pay | Admitting: Unknown Physician Specialty

## 2021-12-28 ENCOUNTER — Ambulatory Visit: Payer: Federal, State, Local not specified - PPO | Admitting: Unknown Physician Specialty

## 2021-12-28 DIAGNOSIS — Z636 Dependent relative needing care at home: Secondary | ICD-10-CM | POA: Diagnosis not present

## 2021-12-28 DIAGNOSIS — R944 Abnormal results of kidney function studies: Secondary | ICD-10-CM | POA: Diagnosis not present

## 2021-12-28 DIAGNOSIS — E538 Deficiency of other specified B group vitamins: Secondary | ICD-10-CM | POA: Diagnosis not present

## 2021-12-28 DIAGNOSIS — E785 Hyperlipidemia, unspecified: Secondary | ICD-10-CM | POA: Insufficient documentation

## 2021-12-28 DIAGNOSIS — F339 Major depressive disorder, recurrent, unspecified: Secondary | ICD-10-CM

## 2021-12-28 NOTE — Progress Notes (Signed)
BP 123/69   Pulse 65   Temp 97.8 F (36.6 C) (Oral)   Wt 152 lb 4.8 oz (69.1 kg)   SpO2 97%   BMI 27.85 kg/m    Subjective:    Patient ID: Virginia West, female    DOB: [birth year], 76 y.o.   MRN: [id number]  HPI: Virginia West is a 76 y.o. female  Chief Complaint  Patient presents with   Depression    Followed by Dr. Shea Evans   Vitamin B12 Deficiency   Pt states she does not have any ongoing issues other than thinking it is time for labs.  Has been getting B12 injection and seeing psychiatry and getting counseling.    B12 - was told she lacks intrinsic factor  Review of labs shows low GFR of 49. This has not yet been followed up on.    States she completed all her wellness visits with colonoscopy and mammogram  The 10-year ASCVD risk score (Arnett DK, et al., 2019) is: 15.1%   Values used to calculate the score:     Age: 79 years     Sex: Female     Is Non-Hispanic African American: No     Diabetic: No     Tobacco smoker: No     Systolic Blood Pressure: 382 mmHg     Is BP treated: No     HDL Cholesterol: 60 mg/dL     Total Cholesterol: 222 mg/dL      12/28/2021   10:38 AM 11/04/2021   11:03 AM 11/02/2021   10:54 AM 10/27/2021    2:11 PM 09/23/2021   10:29 AM  Depression screen PHQ 2/9  Decreased Interest _0 Down, Depressed, Hopeless _1 PHQ - 2 Score _2 Altered sleeping 2  0 1   Tired, decreased energy _3 Change in appetite 0  0 0   Feeling bad or failure about yourself  _4 Trouble concentrating 0  0 0   Moving slowly or fidgety/restless 0  0 0   Suicidal thoughts 0  0 0   PHQ-9 Score _5 Difficult doing work/chores Somewhat difficult  Somewhat difficult Somewhat difficult      Information is confidential and restricted. Go to Review Flowsheets to unlock data.     Relevant past medical, surgical, family and social history reviewed and updated as indicated. Interim medical history since our last visit reviewed. Allergies  and medications reviewed and updated.  Review of Systems  Per HPI unless specifically indicated above     Objective:    BP 123/69   Pulse 65   Temp 97.8 F (36.6 C) (Oral)   Wt 152 lb 4.8 oz (69.1 kg)   SpO2 97%   BMI 27.85 kg/m   Wt Readings from Last 3 Encounters:  12/28/21 152 lb 4.8 oz (69.1 kg)  11/02/21 147 lb 9.6 oz (67 kg)  10/27/21 146 lb 3.2 oz (66.3 kg)    Physical Exam Constitutional:      General: She is not in acute distress.    Appearance: Normal appearance. She is well-developed.  HENT:     Head: Normocephalic and atraumatic.  Eyes:     General: Lids are normal. No scleral icterus.       Right eye: No discharge.        Left eye: No discharge.  Conjunctiva/sclera: Conjunctivae normal.  Neck:     Vascular: No carotid bruit or JVD.  Cardiovascular:     Rate and Rhythm: Normal rate and regular rhythm.     Heart sounds: Normal heart sounds.  Pulmonary:     Effort: Pulmonary effort is normal. No respiratory distress.     Breath sounds: Normal breath sounds.  Abdominal:     Palpations: There is no hepatomegaly or splenomegaly.  Musculoskeletal:        General: Normal range of motion.     Cervical back: Normal range of motion and neck supple.  Skin:    General: Skin is warm and dry.     Coloration: Skin is not pale.     Findings: No rash.  Neurological:     Mental Status: She is alert and oriented to person, place, and time.  Psychiatric:        Behavior: Behavior normal.        Thought Content: Thought content normal.        Judgment: Judgment normal.     Results for orders placed or performed in visit on 02/23/21  Microscopic Examination   BLD  Result Value Ref Range   WBC, UA 0-5 0 - 5 /hpf   RBC, Urine None seen 0 - 2 /hpf   Epithelial Cells (non renal) 0-10 0 - 10 /hpf   Mucus, UA Present (A) Not Estab.   Bacteria, UA Few (A) None seen/Few  Vitamin B12  Result Value Ref Range   Vitamin B-12 228 (L) 232 - 1,245 pg/mL  CBC with  Differential/Platelet  Result Value Ref Range   WBC 9.5 3.4 - 10.8 x10E3/uL   RBC 4.92 3.77 - 5.28 x10E6/uL   Hemoglobin 14.1 11.1 - 15.9 g/dL   Hematocrit 42.6 34.0 - 46.6 %   MCV 87 79 - 97 fL   MCH 28.7 26.6 - 33.0 pg   MCHC 33.1 31.5 - 35.7 g/dL   RDW 14.1 11.7 - 15.4 %   Platelets 287 150 - 450 x10E3/uL   Neutrophils 61 Not Estab. %   Lymphs 27 Not Estab. %   Monocytes 7 Not Estab. %   Eos 4 Not Estab. %   Basos 1 Not Estab. %   Neutrophils Absolute 5.7 1.4 - 7.0 x10E3/uL   Lymphocytes Absolute 2.5 0.7 - 3.1 x10E3/uL   Monocytes Absolute 0.7 0.1 - 0.9 x10E3/uL   EOS (ABSOLUTE) 0.4 0.0 - 0.4 x10E3/uL   Basophils Absolute 0.1 0.0 - 0.2 x10E3/uL   Immature Granulocytes 0 Not Estab. %   Immature Grans (Abs) 0.0 0.0 - 0.1 x10E3/uL  Comprehensive metabolic panel  Result Value Ref Range   Glucose 82 70 - 99 mg/dL   BUN 18 8 - 27 mg/dL   Creatinine, Ser 1.17 (H) 0.57 - 1.00 mg/dL   eGFR 49 (L) >59 mL/min/1.73   BUN/Creatinine Ratio 15 12 - 28   Sodium 138 134 - 144 mmol/L   Potassium 4.5 3.5 - 5.2 mmol/L   Chloride 100 96 - 106 mmol/L   CO2 18 (L) 20 - 29 mmol/L   Calcium 10.1 8.7 - 10.3 mg/dL   Total Protein 8.3 6.0 - 8.5 g/dL   Albumin 4.8 (H) 3.7 - 4.7 g/dL   Globulin, Total 3.5 1.5 - 4.5 g/dL   Albumin/Globulin Ratio 1.4 1.2 - 2.2   Bilirubin Total 0.2 0.0 - 1.2 mg/dL   Alkaline Phosphatase 83 44 - 121 IU/L   AST 21 0 - 40  IU/L   ALT 13 0 - 32 IU/L  Lipid panel  Result Value Ref Range   Cholesterol, Total 222 (H) 100 - 199 mg/dL   Triglycerides 204 (H) 0 - 149 mg/dL   HDL 60 >39 mg/dL   VLDL Cholesterol Cal 36 5 - 40 mg/dL   LDL Chol Calc (NIH) 126 (H) 0 - 99 mg/dL   Chol/HDL Ratio 3.7 0.0 - 4.4 ratio  TSH  Result Value Ref Range   TSH 0.996 0.450 - 4.500 uIU/mL  Vitamin D 1,25 dihydroxy  Result Value Ref Range   Vitamin D 1, 25 (OH)2 Total 35 pg/mL   Vitamin D2 1, 25 (OH)2 <10 pg/mL   Vitamin D3 1, 25 (OH)2 32 pg/mL  Bayer DCA Hb A1c Waived (STAT)  Result  Value Ref Range   HB A1C (BAYER DCA - WAIVED) 5.3 4.8 - 5.6 %  CBC With Differential/Platelet  Result Value Ref Range   WBC 10.0 3.4 - 10.8 x10E3/uL   RBC 5.00 3.77 - 5.28 x10E6/uL   Hemoglobin 14.5 11.1 - 15.9 g/dL   Hematocrit 42.9 34.0 - 46.6 %   MCV 86 79 - 97 fL   MCH 29.0 26.6 - 33.0 pg   MCHC 33.8 31.5 - 35.7 g/dL   RDW 15.2 11.7 - 15.4 %   Platelets 265 150 - 450 x10E3/uL   Neutrophils 68 Not Estab. %   Lymphs 26 Not Estab. %   MID 6 Not Estab. %   Neutrophils Absolute 6.8 1.4 - 7.0 x10E3/uL   Lymphocytes Absolute 2.6 0.7 - 3.1 x10E3/uL   MID (Absolute) 0.6 0.1 - 1.6 X10E3/uL  Urinalysis, Routine w reflex microscopic  Result Value Ref Range   Specific Gravity, UA 1.015 1.005 - 1.030   pH, UA 5.0 5.0 - 7.5   Color, UA Yellow Yellow   Appearance Ur Clear Clear   Leukocytes,UA 1+ (A) Negative   Protein,UA Negative Negative/Trace   Glucose, UA Negative Negative   Ketones, UA Negative Negative   RBC, UA Negative Negative   Bilirubin, UA Negative Negative   Urobilinogen, Ur 0.2 0.2 - 1.0 mg/dL   Nitrite, UA Negative Negative   Microscopic Examination See below:       Assessment & Plan:   Problem List Items Addressed This Visit       Unprioritized   B12 deficiency    Check B12 levels      Relevant Orders   CBC with Differential/Platelet   Vitamin B12   Caregiver stress    Husband with Alzheimers.  She is following with psychiatry.  Discussed following with social work.  She is not interested at this time      Decreased GFR    Last labs showed GFR 49.  Recheck today      Relevant Orders   Comprehensive metabolic panel   Lipid Panel w/o Chol/HDL Ratio   Depression, recurrent (HCC)    Working with counseling and psychiatry      Hyperlipidemia    ASCVD calculator indicates elevated risk.  Check lipids today        Follow up plan: Return in about 6 months (around 06/30/2022).

## 2021-12-28 NOTE — Assessment & Plan Note (Addendum)
Husband with Alzheimers.  She is following with psychiatry.  Discussed following with social work.  She is not interested at this time

## 2021-12-28 NOTE — Assessment & Plan Note (Signed)
Working with counseling and psychiatry

## 2021-12-28 NOTE — Assessment & Plan Note (Signed)
Check B12 levels 

## 2021-12-28 NOTE — Assessment & Plan Note (Signed)
ASCVD calculator indicates elevated risk.  Check lipids today

## 2021-12-28 NOTE — Assessment & Plan Note (Signed)
Last labs showed GFR 49.  Recheck today

## 2021-12-29 LAB — CBC WITH DIFFERENTIAL/PLATELET
Basophils Absolute: 0.1 10*3/uL (ref 0.0–0.2)
Basos: 1 %
EOS (ABSOLUTE): 0.4 10*3/uL (ref 0.0–0.4)
Eos: 4 %
Hematocrit: 41.7 % (ref 34.0–46.6)
Hemoglobin: 13.6 g/dL (ref 11.1–15.9)
Immature Grans (Abs): 0 10*3/uL (ref 0.0–0.1)
Immature Granulocytes: 0 %
Lymphocytes Absolute: 1.7 10*3/uL (ref 0.7–3.1)
Lymphs: 20 %
MCH: 29.4 pg (ref 26.6–33.0)
MCHC: 32.6 g/dL (ref 31.5–35.7)
MCV: 90 fL (ref 79–97)
Monocytes Absolute: 0.5 10*3/uL (ref 0.1–0.9)
Monocytes: 6 %
Neutrophils Absolute: 5.9 10*3/uL (ref 1.4–7.0)
Neutrophils: 69 %
Platelets: 284 10*3/uL (ref 150–450)
RBC: 4.62 x10E6/uL (ref 3.77–5.28)
RDW: 12.9 % (ref 11.7–15.4)
WBC: 8.6 10*3/uL (ref 3.4–10.8)

## 2021-12-29 LAB — COMPREHENSIVE METABOLIC PANEL
ALT: 11 IU/L (ref 0–32)
AST: 23 IU/L (ref 0–40)
Albumin/Globulin Ratio: 1.3 (ref 1.2–2.2)
Albumin: 4.3 g/dL (ref 3.8–4.8)
Alkaline Phosphatase: 71 IU/L (ref 44–121)
BUN/Creatinine Ratio: 17 (ref 12–28)
BUN: 21 mg/dL (ref 8–27)
Bilirubin Total: 0.3 mg/dL (ref 0.0–1.2)
CO2: 20 mmol/L (ref 20–29)
Calcium: 9.7 mg/dL (ref 8.7–10.3)
Chloride: 105 mmol/L (ref 96–106)
Creatinine, Ser: 1.21 mg/dL — ABNORMAL HIGH (ref 0.57–1.00)
Globulin, Total: 3.2 g/dL (ref 1.5–4.5)
Glucose: 116 mg/dL — ABNORMAL HIGH (ref 70–99)
Potassium: 4.8 mmol/L (ref 3.5–5.2)
Sodium: 141 mmol/L (ref 134–144)
Total Protein: 7.5 g/dL (ref 6.0–8.5)
eGFR: 47 mL/min/{1.73_m2} — ABNORMAL LOW (ref >59)

## 2021-12-29 LAB — LIPID PANEL W/O CHOL/HDL RATIO
Cholesterol, Total: 197 mg/dL (ref 100–199)
HDL: 62 mg/dL (ref >39)
LDL Chol Calc (NIH): 110 mg/dL — ABNORMAL HIGH (ref 0–99)
Triglycerides: 145 mg/dL (ref 0–149)
VLDL Cholesterol Cal: 25 mg/dL (ref 5–40)

## 2021-12-29 LAB — VITAMIN B12: Vitamin B-12: 1136 pg/mL (ref 232–1245)

## 2022-01-04 ENCOUNTER — Encounter: Payer: Self-pay | Admitting: Psychiatry

## 2022-01-04 ENCOUNTER — Ambulatory Visit: Payer: Federal, State, Local not specified - PPO | Admitting: Psychiatry

## 2022-01-04 VITALS — BP 169/77 | HR 61 | Temp 97.8°F | Wt 151.6 lb

## 2022-01-04 DIAGNOSIS — F3341 Major depressive disorder, recurrent, in partial remission: Secondary | ICD-10-CM | POA: Diagnosis not present

## 2022-01-04 DIAGNOSIS — F4381 Prolonged grief disorder: Secondary | ICD-10-CM | POA: Diagnosis not present

## 2022-01-04 DIAGNOSIS — F4310 Post-traumatic stress disorder, unspecified: Secondary | ICD-10-CM | POA: Diagnosis not present

## 2022-01-04 MED ORDER — BUPROPION HCL 75 MG PO TABS: 75.0000 mg | ORAL_TABLET | Freq: Every day | ORAL | 1 refills | Status: DC

## 2022-01-04 MED ORDER — MIRTAZAPINE 7.5 MG PO TABS: 7.5000 mg | ORAL_TABLET | Freq: Every day | ORAL | 1 refills | Status: DC

## 2022-01-04 NOTE — Progress Notes (Unsigned)
BH MD OP Progress Note  01/04/2022 11:06 AM Virginia West  MRN:  [id number]  Chief Complaint:  Chief Complaint  Patient presents with   Follow-up: 76 year old Caucasian female with history of depression, PTSD, grief, presented for medication management.   HPI: Virginia West is a 76 year old Caucasian female, married, lives at [facility], has a history of MDD, PTSD, prolonged grief disorder, multiple medical problems including low back pain, vitamin B12 deficiency, urinary incontinence, pancreatitis was evaluated in office today.  Patient today reports she is currently improving with regards to her mood.  Continues to have grief although she is coping better.  Reports she does worry about things since she has a lot going on.  She is currently struggling with low back pain, was recently started on a course of prednisone again and also has started physical therapy twice a week.  She just completed physical therapy for the day prior to coming into the office.  That seems to be helpful with her pain.  Currently rates her pain at a 3 out of 10 with 10 being the worst.  Patient reports she is compliant on the Wellbutrin.  Denies side effects.  Does report mirtazapine is beneficial for her sleep.  She is sleeping better than before.  Was able to establish care with therapist-Ms. Felecia Jan.  Had couple of sessions already.  Has several sessions scheduled for the next few weeks.  Looks forward to staying in therapy.  Patient denies any suicidality, homicidality or perceptual disturbances.  Patient denies any other concerns today.  Visit Diagnosis:    ICD-10-CM   1. PTSD (post-traumatic stress disorder)  F43.10 mirtazapine (REMERON) 7.5 MG tablet    2. 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  Past trials of Celexa-GI side effects.  Past Medical History:  Past Medical History:  Diagnosis Date   Asthma    Pancreatitis     Past Surgical History:  Procedure Laterality Date   ABDOMINAL HYSTERECTOMY     APPENDECTOMY     CHOLECYSTECTOMY     COLONOSCOPY WITH PROPOFOL N/A 03/24/2021   Procedure: COLONOSCOPY WITH PROPOFOL;  Surgeon: Toney Reil, MD;  Location: [facility] ENDOSCOPY;  Service: Gastroenterology;  Laterality: N/A;    Family Psychiatric History: Reviewed family psychiatric history from progress note on 07/16/2021.  Family History:  Family History  Problem Relation Age of Onset   Heart disease Mother    Heart disease Father    Cancer Brother    Breast cancer Maternal Aunt 2   Cancer Son    Depression Son     Social History: Reviewed social history from progress note on 07/16/2021. Social History   Socioeconomic History   Marital status: Married    Spouse name: Not on file   Number of children: 3   Years of education: Not on file   Highest education level: Some college, no degree  Occupational History   Occupation: retired  Tobacco Use   Smoking status: Never    Passive exposure: Past   Smokeless tobacco: Never  Vaping Use   Vaping Use: Never used  Substance and Sexual Activity   Alcohol use: Not Currently   Drug use: Never   Sexual activity: Not Currently  Other Topics Concern   Not on  file  Social History Narrative   Not on file   Social Determinants of Health   Financial Resource Strain: Not on file  Food Insecurity: Not on file  Transportation Needs: Not on file  Physical Activity: Not on file  Stress: Not on file  Social Connections: Not on file    Allergies: No Known Allergies  Metabolic Disorder Labs: Lab Results  Component Value Date   HGBA1C 5.3 02/23/2021   No results found for: "PROLACTIN" Lab Results  Component Value Date   CHOL 197 12/28/2021   TRIG 145 12/28/2021   HDL 62  12/28/2021   CHOLHDL 3.7 02/23/2021   LDLCALC 110 (H) 12/28/2021   LDLCALC 126 (H) 02/23/2021   Lab Results  Component Value Date   TSH 0.996 02/23/2021    Therapeutic Level Labs: No results found for: "LITHIUM" No results found for: "VALPROATE" No results found for: "CBMZ"  Current Medications: Current Outpatient Medications  Medication Sig Dispense Refill   Cholecalciferol (VITAMIN D3) 10 MCG (400 UNIT) tablet Take 400 Units by mouth daily.     clobetasol ointment (TEMOVATE) 0.05 % APPLY 1 APPLICATION TOPICALLY 2 (TWO) TIMES A WEEK. 60 g 0   clonazePAM (KLONOPIN) 0.5 MG tablet Take 0.5-1 tablets (0.25-0.5 mg total) by mouth daily as needed for anxiety. Take as needed for severe anxiety, grief- please limit use 21 tablet 0   clotrimazole-betamethasone (LOTRISONE) cream Apply 1 application. topically daily. 30 g 1   latanoprost (XALATAN) 0.005 % ophthalmic solution SMARTSIG:1 In Eye(s) Every Night     oxybutynin (DITROPAN) 5 MG tablet TAKE 1 TABLET BY MOUTH TWICE A DAY 180 tablet 0   predniSONE (STERAPRED UNI-PAK 21 TAB) 10 MG (21) TBPK tablet Take 10 mg by mouth daily.     timolol (TIMOPTIC) 0.5 % ophthalmic solution SMARTSIG:In Eye(s)     buPROPion (WELLBUTRIN) 75 MG tablet Take 1 tablet (75 mg total) by mouth daily with breakfast. 90 tablet 1   cyanocobalamin (,VITAMIN B-12,) 1000 MCG/ML injection Inject 1,000 mcg into the muscle once. (Patient not taking: Reported on 01/04/2022)     mirtazapine (REMERON) 7.5 MG tablet Take 1 tablet (7.5 mg total) by mouth at bedtime. 90 tablet 1   No current facility-administered medications for this visit.     Musculoskeletal: Strength & Muscle Tone: within normal limits Gait & Station: normal Patient leans: N/A  Psychiatric Specialty Exam: Review of Systems  Musculoskeletal:  Positive for back pain (chronic).  Psychiatric/Behavioral:  Positive for dysphoric mood. The patient is nervous/anxious.   All other systems reviewed and are  negative.   Blood pressure (!) 169/77, pulse 61, temperature 97.8 F (36.6 C), temperature source Temporal, weight 151 lb 9.6 oz (68.8 kg).Body mass index is 27.72 kg/m.  General Appearance: Casual  Eye Contact:  Good  Speech:  Clear and Coherent  Volume:  Decreased  Mood:  Anxious and Depressed  Affect:  Congruent  Thought Process:  Goal Directed and Descriptions of Associations: Intact  Orientation:  Full (Time, Place, and Person)  Thought Content: Logical   Suicidal Thoughts:  No  Homicidal Thoughts:  No  Memory:  Immediate;   Fair Recent;   Fair Remote;   Fair  Judgement:  Fair  Insight:  Fair  Psychomotor Activity:  Normal  Concentration:  Concentration: Fair and Attention Span: Fair  Recall:  [employer] of Knowledge: Fair  Language: Fair  Akathisia:  No  Handed:  Right  AIMS (if indicated): done  Assets:  Communication Skills  Desire for Improvement Housing Talents/Skills Transportation  ADL's:  Intact  Cognition: WNL  Sleep:  Fair   Screenings: AIMS    Flowsheet Row Office Visit from 01/04/2022 in [facility] Office Visit from 11/04/2021 in [facility] Office Visit from 09/23/2021 in [facility]  AIMS Total Score 0 0 0      GAD-7    Flowsheet Row Office Visit from 01/04/2022 in [facility] Office Visit from 12/28/2021 in [facility] Office Visit from 11/02/2021 in [facility] Office Visit from 10/27/2021 in [facility] Office Visit from 07/16/2021 in [facility]  Total GAD-7 Score 7 13 7 14 8       PHQ2-9    Flowsheet Row Office Visit from 01/04/2022 in [facility] Office Visit from 12/28/2021 in [facility] Office Visit from 11/04/2021 in [facility] Office Visit from 11/02/2021 in [facility] Office Visit from  10/27/2021 in [facility]  PHQ-2 Total Score 2 4 2 2 2   PHQ-9 Total Score 8 10 4 4 5       Flowsheet Row Office Visit from 01/04/2022 in [facility] Office Visit from 11/04/2021 in [facility] Office Visit from 08/12/2021 in [facility]  C-SSRS RISK CATEGORY Error: Q3, 4, or 5 should not be populated when Q2 is No Low Risk Low Risk        Assessment and Plan: Lakeya Mulka is a 76 year old Caucasian female, married, retired, lives in [place], has a history of PTSD, MDD, prolonged grief, multiple medical problems, presented for medication management.  Patient is currently improving, will benefit from the following plan.  Plan PTSD-improving Mirtazapine 7.5 mg p.o. nightly Patient to continue CBT with Ms. [name].  MDD in partial remission Wellbutrin 75 mg p.o. daily Mirtazapine 75 mg p.o. nightly Continue CBT  Prolonged grief reaction-improving Continue CBT Klonopin 0.5 mg as needed for severe anxiety attacks-although available she has not used it.  Patient with elevated blood pressure reading in session, had blood pressure repeated-continues to be elevated.  Patient however had primary care provider visit on 12/28/2021 Ms. [place] -blood pressure that day was reviewed-123/69-within normal limits.  Patient advised to follow up with primary care provider if her blood pressure continues to be elevated.  Follow-up in clinic in 3 to 4 months or sooner if needed.  In the meantime patient advised to have more regular CBT sessions.    This note was generated in part or whole with voice recognition software. Voice recognition is usually quite accurate but there are transcription errors that can and very often do occur. I apologize for any typographical errors that were not detected and corrected.      Felecia Jan, MD 01/05/2022, 8:25 AM

## 2022-01-20 ENCOUNTER — Ambulatory Visit: Payer: Self-pay | Admitting: *Deleted

## 2022-01-20 NOTE — Telephone Encounter (Signed)
Patient reports she is returning a call. Last noted message for patient from 12/29/21. Pt given lab results / message per notes of C. Wicker, NP from 12/29/21 on 01/20/22. Pt verbalized understanding.

## 2022-02-15 ENCOUNTER — Encounter: Payer: Self-pay | Admitting: Nurse Practitioner

## 2022-03-05 ENCOUNTER — Other Ambulatory Visit: Payer: Self-pay | Admitting: Obstetrics and Gynecology

## 2022-03-05 DIAGNOSIS — L900 Lichen sclerosus et atrophicus: Secondary | ICD-10-CM

## 2022-03-07 NOTE — Telephone Encounter (Signed)
Refilled medication however made patient aware she needs a follow-up with Dr. Amalia Hailey via [place].

## 2022-04-16 ENCOUNTER — Ambulatory Visit
Admission: EM | Admit: 2022-04-16 | Discharge: 2022-04-16 | Disposition: A | Discharge status: Home / Self Care | Payer: Federal, State, Local not specified - PPO | Attending: Physician Assistant | Admitting: Physician Assistant

## 2022-04-16 DIAGNOSIS — R0981 Nasal congestion: Secondary | ICD-10-CM | POA: Diagnosis present

## 2022-04-16 DIAGNOSIS — R051 Acute cough: Secondary | ICD-10-CM | POA: Diagnosis not present

## 2022-04-16 DIAGNOSIS — J069 Acute upper respiratory infection, unspecified: Secondary | ICD-10-CM | POA: Diagnosis not present

## 2022-04-16 DIAGNOSIS — Z20822 Contact with and (suspected) exposure to covid-19: Secondary | ICD-10-CM | POA: Insufficient documentation

## 2022-04-16 DIAGNOSIS — E785 Hyperlipidemia, unspecified: Secondary | ICD-10-CM | POA: Insufficient documentation

## 2022-04-16 LAB — RESP PANEL BY RT-PCR (FLU A&B, COVID) ARPGX2
Influenza A by PCR: NEGATIVE
Influenza B by PCR: NEGATIVE
SARS Coronavirus 2 by RT PCR: NEGATIVE

## 2022-04-16 MED ORDER — IPRATROPIUM BROMIDE 0.06 % NA SOLN: 2.0000 | Freq: Four times a day (QID) | NASAL | 0 refills | Status: DC

## 2022-04-16 MED ORDER — PROMETHAZINE-DM 6.25-15 MG/5ML PO SYRP: 5.0000 mL | ORAL_SOLUTION | Freq: Four times a day (QID) | ORAL | 0 refills | Status: DC | PRN

## 2022-04-16 NOTE — ED Triage Notes (Signed)
Pt states that she has some nasal congestion, cough, headache and fatigue. X2 days

## 2022-04-16 NOTE — Discharge Instructions (Signed)
URI/COLD SYMPTOMS: Your exam today is consistent with a viral illness. Antibiotics are not indicated at this time. Use medications as directed, including cough syrup, nasal saline, and decongestants. Your symptoms should improve over the next few days and resolve within 7-10 days. Increase rest and fluids. F/u if symptoms worsen or predominate such as sore throat, ear pain, productive cough, shortness of breath, or if you develop high fevers or worsening fatigue over the next several days.    Negative COVID, flu

## 2022-04-16 NOTE — ED Provider Notes (Signed)
[facility]    CSN: [id number] Arrival date & time: 04/16/22  1318      History   Chief Complaint Chief Complaint  Patient presents with   Nasal Congestion    Nasal congestion, cough, headache and fatigue. X2 days    HPI [place] West is a 76 y.o. female presenting for onset of fatigue, cough, headaches, congestion 2 days ago.  She denies any associated fevers.  Not reporting any significant sore throat, sinus pain, chest pain, shortness of breath, vomiting or diarrhea.  Denies any sick contacts.  Reports a negative COVID test 2 days ago.  Has been taking over-the-counter DayQuil and Tylenol.  Medical history significant for hyperlipidemia, hip pain, PTSD/depression and vitamin deficiencies.  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/16/2021   Depression, recurrent (HCC) 05/25/2021   Current mild episode of major depressive disorder (HCC) 04/27/2021   Caregiver stress 04/27/2021   Colon cancer screening    Urge incontinence of urine 03/16/2021   Elevated serum creatinine 03/16/2021   PTSD (post-traumatic stress disorder) 02/23/2021   Vitamin D deficiency 02/23/2021   Neuropathy 02/23/2021   B12 deficiency 02/23/2021   Screen for colon cancer 02/23/2021   Screening for osteoporosis 02/23/2021   Weight loss 02/23/2021   Encounter for screening for malignant neoplasm of breast 02/23/2021   Need for influenza vaccination 02/23/2021   Glaucoma 02/23/2021   Screening for cervical cancer 02/23/2021    Past Surgical History:  Procedure Laterality Date   ABDOMINAL HYSTERECTOMY     APPENDECTOMY      CHOLECYSTECTOMY     COLONOSCOPY WITH PROPOFOL N/A 03/24/2021   Procedure: COLONOSCOPY WITH PROPOFOL;  Surgeon: Toney Reil, MD;  Location: [facility] ENDOSCOPY;  Service: Gastroenterology;  Laterality: N/A;    OB History     Gravida  3   Para  3   Term      Preterm  0   AB  0   Living  3      SAB      IAB      Ectopic      Multiple      Live Births               Home Medications    Prior to Admission medications   Medication Sig Start Date End Date Taking? Authorizing Provider  buPROPion (WELLBUTRIN) 75 MG tablet Take 1 tablet (75 mg total) by mouth daily with breakfast. 01/04/22  Yes Eappen, Saramma, MD  Cholecalciferol (VITAMIN D3) 10 MCG (400 UNIT) tablet Take 400 Units by mouth daily.   Yes [provider]  clobetasol ointment (TEMOVATE) 0.05 % APPLY 1 APPLICATION TOPICALLY 2 (TWO) TIMES A WEEK. 03/07/22  Yes Linzie Collin, MD  clonazePAM (KLONOPIN) 0.5 MG tablet Take 0.5-1 tablets (0.25-0.5 mg total) by mouth daily as needed for anxiety. Take as needed for severe anxiety, grief- please limit use 07/16/21  Yes Jomarie Longs, MD  clotrimazole-betamethasone (LOTRISONE) cream Apply 1 application. topically daily. 10/27/21  Yes Vigg, Avanti, MD  ipratropium (ATROVENT) 0.06 % nasal spray Place 2 sprays into both  nostrils 4 (four) times daily. 04/16/22  Yes Eusebio Friendly B, PA-C  latanoprost (XALATAN) 0.005 % ophthalmic solution SMARTSIG:1 In Eye(s) Every Night 11/24/21  Yes [provider]  mirtazapine (REMERON) 7.5 MG tablet Take 1 tablet (7.5 mg total) by mouth at bedtime. 01/04/22  Yes Eappen, Levin Bacon, MD  oxybutynin (DITROPAN) 5 MG tablet TAKE 1 TABLET BY MOUTH TWICE A DAY 09/09/21  Yes Vigg, Avanti, MD  promethazine-dextromethorphan (PROMETHAZINE-DM) 6.25-15 MG/5ML syrup Take 5 mLs by mouth 4 (four) times daily as needed. 04/16/22  Yes Eusebio Friendly B, PA-C  timolol (TIMOPTIC) 0.5 % ophthalmic solution SMARTSIG:In Eye(s) 03/15/21  Yes [provider]  cyanocobalamin (,VITAMIN B-12,) 1000 MCG/ML injection Inject 1,000 mcg into the muscle once. Patient not taking: Reported on 01/04/2022    [provider]  predniSONE (STERAPRED UNI-PAK 21 TAB) 10 MG (21) TBPK tablet Take 10 mg by mouth daily.    [provider]    Family History Family History  Problem Relation Age of Onset   Heart disease Mother    Heart disease Father    Cancer Brother    Breast cancer Maternal Aunt 24   Cancer Son    Depression Son     Social History Social History   Tobacco Use   Smoking status: Never    Passive exposure: Past   Smokeless tobacco: Never  Vaping Use   Vaping Use: Never used  Substance Use Topics   Alcohol use: Not Currently   Drug use: Never     Allergies   Patient has no known allergies.   Review of Systems Review of Systems  Constitutional:  Positive for fatigue. Negative for chills, diaphoresis and fever.  HENT:  Positive for congestion, rhinorrhea and sore throat. Negative for ear pain, sinus pressure and sinus pain.   Respiratory:  Positive for cough. Negative for shortness of breath.   Gastrointestinal:  Negative for abdominal pain, nausea and vomiting.  Musculoskeletal:  Negative for arthralgias and myalgias.  Skin:  Negative for rash.  Neurological:  Positive for headaches. Negative for weakness.  Hematological:  Negative for adenopathy.     Physical Exam Triage Vital Signs ED Triage Vitals  Enc Vitals Group     BP 04/16/22 1338 (!) 145/82     Pulse Rate 04/16/22 1338 68     Resp 04/16/22 1338 18     Temp 04/16/22 1338 98.4 F (36.9 C)     Temp Source 04/16/22 1338 Oral     SpO2 04/16/22 1338 95 %     Weight 04/16/22 1337 152 lb (68.9 kg)     Height 04/16/22 1337 5\' 2"  (1.575 m)     Head Circumference --      Peak Flow --      Pain Score 04/16/22 1337 3     Pain Loc --      Pain Edu? --      Excl. in GC? --    No data found.  Updated Vital Signs BP (!) 145/82 (BP  Location: Left Arm)   Pulse 68   Temp 98.4 F (36.9 C) (Oral)   Resp 18   Ht 5\' 2"  (1.575 m)   Wt 152 lb (68.9 kg)   SpO2 95%   BMI 27.80 kg/m      Physical Exam Vitals and nursing note reviewed.  Constitutional:      General: She is not in acute distress.    Appearance: Normal appearance. She is not ill-appearing or toxic-appearing.  HENT:     Head: Normocephalic and atraumatic.     Nose: Congestion present.     Mouth/Throat:     Mouth: Mucous membranes are moist.     Pharynx: Oropharynx is clear.  Eyes:     General: No scleral icterus.       Right eye: No discharge.        Left eye: No discharge.     Conjunctiva/sclera: Conjunctivae normal.  Cardiovascular:     Rate and Rhythm: Normal rate and regular rhythm.     Heart sounds: Normal heart sounds.  Pulmonary:     Effort: Pulmonary effort is normal. No respiratory distress.     Breath sounds: Normal breath sounds.  Musculoskeletal:     Cervical back: Neck supple.  Skin:    General: Skin is dry.  Neurological:     General: No focal deficit present.     Mental Status: She is alert. Mental status is at baseline.     Motor: No weakness.     Gait: Gait normal.  Psychiatric:        Mood and Affect: Mood normal.        Behavior: Behavior normal.        Thought Content: Thought content normal.      UC Treatments / Results  Labs (all labs ordered are listed, but only abnormal results are displayed) Labs Reviewed  RESP PANEL BY RT-PCR (FLU A&B, COVID) [id number]    EKG   Radiology No results found.  Procedures Procedures (including critical care time)  Medications Ordered in UC Medications - No data to display  Initial Impression / Assessment and Plan / UC Course  I have reviewed the triage vital signs and the nursing notes.  Pertinent labs & imaging results that were available during my care of the patient were reviewed by me and considered in my medical decision making (see chart for details).    76 year old female presents for 2-day history of fatigue, headaches, cough and congestion.  No known associated fever but has been taking DayQuil and Tylenol.  She is afebrile and overall well-appearing.  In no acute distress.  On exam she does have nasal congestion.  Throat is clear.  Chest clear to auscultation heart regular rate and rhythm.  Respiratory panel obtained to assess for flu and COVID-19.  Negative.  Advised patient that she has another viral URI.  Supportive care encouraged.  Sent Promethazine DM and Atrovent nasal spray.  Plenty of rest and fluids.  Reviewed returning if she is not feeling better after 10 days or if she develops sinus pain, chest pain, breathing difficulty, weakness, etc.   Final Clinical Impressions(s) / UC Diagnoses   Final diagnoses:  Viral upper respiratory tract infection  Nasal congestion  Acute cough     Discharge Instructions      URI/COLD SYMPTOMS: Your exam today is consistent with a viral illness. Antibiotics are not indicated at this time. Use medications as directed, including cough syrup, nasal saline, and decongestants. Your symptoms should improve over the next few days and resolve within 7-10 days. Increase rest and fluids. F/u if symptoms worsen or predominate such as sore throat, ear pain, productive cough, shortness of breath, or if you develop high fevers or worsening fatigue over the next several days.    Negative COVID, flu     ED Prescriptions     Medication Sig Dispense Auth. Provider   promethazine-dextromethorphan (PROMETHAZINE-DM) 6.25-15 MG/5ML syrup Take 5 mLs by mouth 4 (  four) times daily as needed. 118 mL Eusebio Friendly B, PA-C   ipratropium (ATROVENT) 0.06 % nasal spray Place 2 sprays into both nostrils 4 (four) times daily. 15 mL Shirlee Latch, PA-C      PDMP not reviewed this encounter.   Shirlee Latch, PA-C 04/16/22 1448

## 2022-04-18 ENCOUNTER — Encounter: Payer: Self-pay | Admitting: Obstetrics and Gynecology

## 2022-04-26 ENCOUNTER — Ambulatory Visit: Payer: Federal, State, Local not specified - PPO | Admitting: Obstetrics and Gynecology

## 2022-05-06 ENCOUNTER — Ambulatory Visit: Payer: Federal, State, Local not specified - PPO | Admitting: Psychiatry

## 2022-05-12 ENCOUNTER — Ambulatory Visit: Payer: Federal, State, Local not specified - PPO | Admitting: Psychiatry

## 2022-05-25 ENCOUNTER — Encounter: Payer: Self-pay | Admitting: Obstetrics and Gynecology

## 2022-05-25 ENCOUNTER — Ambulatory Visit (INDEPENDENT_AMBULATORY_CARE_PROVIDER_SITE_OTHER): Payer: Federal, State, Local not specified - PPO | Admitting: Obstetrics and Gynecology

## 2022-05-25 VITALS — BP 155/79 | HR 57 | Ht 62.0 in | Wt 151.7 lb

## 2022-05-25 DIAGNOSIS — N3941 Urge incontinence: Secondary | ICD-10-CM

## 2022-05-25 DIAGNOSIS — L900 Lichen sclerosus et atrophicus: Secondary | ICD-10-CM | POA: Diagnosis not present

## 2022-05-25 MED ORDER — CLOBETASOL PROPIONATE 0.05 % EX OINT: TOPICAL_OINTMENT | CUTANEOUS | 3 refills | Status: DC

## 2022-05-25 MED ORDER — TOLTERODINE TARTRATE ER 2 MG PO CP24: 2.0000 mg | ORAL_CAPSULE | Freq: Every day | ORAL | 2 refills | Status: DC

## 2022-05-25 NOTE — Progress Notes (Signed)
HPI:      [name] is a 76 y.o. G3P0003 who LMP was No LMP recorded. Patient has had a hysterectomy.  Subjective:   She presents today to discuss her lichen sclerosus.  She is using clobetasol ointment successfully.  She reports her itching has resolved as long as she uses the medication.  She has some questions regarding long-term use and if she was ever going to be "cured". In addition she has experienced urge incontinence in the past and was prescribed oxybutynin.  She has failed this as she noted it did not make much difference.  Her main problem is nocturia and when she first gets up in the morning she has to immediately run to the restroom and sometimes does not quite make it there.    Hx: The following portions of the patient's history were reviewed and updated as appropriate:             She  has a past medical history of Asthma and Pancreatitis. She does not have any pertinent problems on file. She  has a past surgical history that includes Abdominal hysterectomy; Appendectomy; Cholecystectomy; and Colonoscopy with propofol (N/A, 03/24/2021). Her family history includes Breast cancer (age of onset: 39) in her maternal aunt; Cancer in her brother and son; Depression in her son; Heart disease in her father and mother. She  reports that she has never smoked. She has been exposed to tobacco smoke. She has never used smokeless tobacco. She reports that she does not currently use alcohol. She reports that she does not use drugs. She has a current medication list which includes the following prescription(s): bupropion, vitamin d3, latanoprost, mirtazapine, timolol, tolterodine, [START ON 05/26/2022] clobetasol ointment, clonazepam, ipratropium, and oxybutynin. She has No Known Allergies.       Review of Systems:  Review of Systems  Constitutional: Denied constitutional symptoms, night sweats, recent illness, fatigue, fever, insomnia and weight loss.  Eyes: Denied eye symptoms, eye  pain, photophobia, vision change and visual disturbance.  Ears/Nose/Throat/Neck: Denied ear, nose, throat or neck symptoms, hearing loss, nasal discharge, sinus congestion and sore throat.  Cardiovascular: Denied cardiovascular symptoms, arrhythmia, chest pain/pressure, edema, exercise intolerance, orthopnea and palpitations.  Respiratory: Denied pulmonary symptoms, asthma, pleuritic pain, productive sputum, cough, dyspnea and wheezing.  Gastrointestinal: Denied, gastro-esophageal reflux, melena, nausea and vomiting.  Genitourinary: See HPI for additional information.  Musculoskeletal: Denied musculoskeletal symptoms, stiffness, swelling, muscle weakness and myalgia.  Dermatologic: Denied dermatology symptoms, rash and scar.  Neurologic: Denied neurology symptoms, dizziness, headache, neck pain and syncope.  Psychiatric: Denied psychiatric symptoms, anxiety and depression.  Endocrine: Denied endocrine symptoms including hot flashes and night sweats.   Meds:   Current Outpatient Medications on File Prior to Visit  Medication Sig Dispense Refill   buPROPion (WELLBUTRIN) 75 MG tablet Take 1 tablet (75 mg total) by mouth daily with breakfast. 90 tablet 1   Cholecalciferol (VITAMIN D3) 10 MCG (400 UNIT) tablet Take 400 Units by mouth daily.     latanoprost (XALATAN) 0.005 % ophthalmic solution SMARTSIG:1 In Eye(s) Every Night     mirtazapine (REMERON) 7.5 MG tablet Take 1 tablet (7.5 mg total) by mouth at bedtime. 90 tablet 1   timolol (TIMOPTIC) 0.5 % ophthalmic solution SMARTSIG:In Eye(s)     clonazePAM (KLONOPIN) 0.5 MG tablet Take 0.5-1 tablets (0.25-0.5 mg total) by mouth daily as needed for anxiety. Take as needed for severe anxiety, grief- please limit use 21 tablet 0   ipratropium (ATROVENT) 0.06 % nasal  spray Place 2 sprays into both nostrils 4 (four) times daily. 15 mL 0   oxybutynin (DITROPAN) 5 MG tablet TAKE 1 TABLET BY MOUTH TWICE A DAY 180 tablet 0   No current  facility-administered medications on file prior to visit.      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/16/2021   Depression, recurrent (HCC) 05/25/2021   Current mild episode of major depressive disorder (HCC) 04/27/2021   Caregiver stress 04/27/2021   Colon cancer screening    Urge incontinence of urine 03/16/2021   Elevated serum creatinine 03/16/2021   PTSD (post-traumatic stress disorder) 02/23/2021   Vitamin D deficiency 02/23/2021   Neuropathy 02/23/2021   B12 deficiency 02/23/2021   Screen for colon cancer 02/23/2021   Screening for osteoporosis 02/23/2021   Weight loss 02/23/2021   Encounter for screening for malignant neoplasm of breast 02/23/2021   Need for influenza vaccination 02/23/2021   Glaucoma 02/23/2021   Screening for cervical cancer 02/23/2021     1. Lichen sclerosus   2. Urge incontinence     Patient has failed oxybutynin for urge incontinence.  Reports lichen sclerosus under control with clobetasol   Plan:            1.  Continue clobetasol use twice weekly .  Chronic recurrent nature of lichen sclerosus discussed  2.  Detrol LA 2 mg -to see if this makes any difference with her urge incontinence especially nocturia. Orders No orders of the defined types were placed in this encounter.    Meds ordered this encounter  Medications   clobetasol ointment (TEMOVATE) 0.05 %    Sig: Apply topically 2 (two) times a week.     Dispense:  60 g    Refill:  3   tolterodine (DETROL LA) 2 MG 24 hr capsule    Sig: Take 1 capsule (2 mg total) by mouth daily.    Dispense:  30 capsule    Refill:  2      F/U  Return in about 6 weeks (around 07/06/2022). I spent 22 minutes involved in the care of this patient preparing to see the patient by obtaining and reviewing her medical history (including labs, imaging tests and prior procedures), documenting clinical information in the electronic health record (EHR), counseling and coordinating care plans, writing and sending prescriptions, ordering tests or procedures and in direct communicating with the patient and medical staff discussing pertinent items from her history and physical exam.  Elonda Husky, M.D. 05/25/2022 3:02 PM

## 2022-05-25 NOTE — Progress Notes (Signed)
Patient presents today regarding her lichen sclerous. She states continuing to use Clobetasol twice a week, reports if she forgets the itching resumes. Patient also reports concerns of urge incontinence, states this has been ongoing for one year and has been taking Oxybutin with minimal help. No additional concerns.

## 2022-06-29 ENCOUNTER — Encounter: Payer: Self-pay | Admitting: Psychiatry

## 2022-06-29 ENCOUNTER — Ambulatory Visit (INDEPENDENT_AMBULATORY_CARE_PROVIDER_SITE_OTHER): Payer: Federal, State, Local not specified - PPO | Admitting: Psychiatry

## 2022-06-29 VITALS — BP 144/79 | HR 68 | Temp 97.4°F | Ht 62.0 in | Wt 152.6 lb

## 2022-06-29 DIAGNOSIS — F4381 Prolonged grief disorder: Secondary | ICD-10-CM

## 2022-06-29 DIAGNOSIS — F3341 Major depressive disorder, recurrent, in partial remission: Secondary | ICD-10-CM | POA: Diagnosis not present

## 2022-06-29 DIAGNOSIS — F4310 Post-traumatic stress disorder, unspecified: Secondary | ICD-10-CM

## 2022-06-29 DIAGNOSIS — F411 Generalized anxiety disorder: Secondary | ICD-10-CM

## 2022-06-29 MED ORDER — MIRTAZAPINE 7.5 MG PO TABS: 7.5000 mg | ORAL_TABLET | Freq: Every day | ORAL | 1 refills | Status: DC

## 2022-06-29 MED ORDER — BUPROPION HCL 75 MG PO TABS: 75.0000 mg | ORAL_TABLET | Freq: Every day | ORAL | 1 refills | Status: DC

## 2022-06-29 NOTE — Progress Notes (Unsigned)
[place] MD OP Progress Note  06/29/2022 11:19 AM Virginia West  MRN:  [id number]  Chief Complaint:  Chief Complaint  Patient presents with   Follow-up   Depression   Anxiety   HPI: Virginia West is a 77 year old Caucasian female, married, lives at [facility], has a history of MDD, PTSD, prolonged grief disorder, multiple medical problems including low back pain, vitamin B12 deficiency, urinary incontinence, pancreatitis was evaluated in office today.  Patient's last visit was on 01/04/2022.  Patient today reports she is coping better with her trauma.  She reports she still has some triggers which does cause intrusive memories.  When it happens it lasts for almost a day.  She however reports she has been making use of her coping strategies and that does help when it happens.  However overall she feels much better than before although she continues to grieve the loss of her son who she reports will be a part of her forever.  Patient reports sleep is restless since the past few days because of neck pain.  She is unable to find a comfortable position.  Otherwise sleep was previously okay on the mirtazapine.  She has upcoming appointment with [facility] for management of pain.  She does have a lot of anxiety due to caring for her husband who has dementia.  She reports since the past few weeks he has been decompensating.  She does not have a lot of support currently trying to get someone to help her at home with him.  Patient has not been compliant with psychotherapy sessions.  However motivated to contact Ms. Miguel Dibble again.  Denies any suicidality, homicidality or perceptual disturbances.  Patient appeared to be alert, oriented to person place time situation.  Currently compliant on medications, denies side effects.    Visit Diagnosis:    ICD-10-CM   1. PTSD (post-traumatic stress disorder)  F43.10 mirtazapine (REMERON) 7.5 MG tablet    2. MDD (major depressive disorder), recurrent, in  partial remission (HCC)  F33.41 buPROPion (WELLBUTRIN) 75 MG tablet    3. Prolonged grief disorder  F43.81 mirtazapine (REMERON) 7.5 MG tablet    4. GAD (generalized anxiety disorder)  F41.1       Past Psychiatric History: Reviewed past psychiatric history from progress note on 07/16/2021.  Past trials of Celexa-GI side effects.  Past Medical History:  Past Medical History:  Diagnosis Date   Asthma    Pancreatitis     Past Surgical History:  Procedure Laterality Date   ABDOMINAL HYSTERECTOMY     APPENDECTOMY     CHOLECYSTECTOMY     COLONOSCOPY WITH PROPOFOL N/A 03/24/2021   Procedure: COLONOSCOPY WITH PROPOFOL;  Surgeon: Lin Landsman, MD;  Location: [facility] ENDOSCOPY;  Service: Gastroenterology;  Laterality: N/A;    Family Psychiatric History: Reviewed family psychiatric history from progress note on 07/16/2021.  Family History:  Family History  Problem Relation Age of Onset   Heart disease Mother    Heart disease Father    Cancer Brother    Breast cancer Maternal Aunt 60   Cancer Son    Depression Son     Social History: Reviewed social history from progress note on 07/16/2021. Social History   Socioeconomic History   Marital status: Married    Spouse name: Not on file   Number of children: 3   Years of education: Not on file   Highest education level: Some college, no degree  Occupational History   Occupation: retired  Tobacco Use  Smoking status: Never    Passive exposure: Past   Smokeless tobacco: Never  Vaping Use   Vaping Use: Never used  Substance and Sexual Activity   Alcohol use: Not Currently   Drug use: Never   Sexual activity: Not Currently  Other Topics Concern   Not on file  Social History Narrative   Not on file   Social Determinants of Health   Financial Resource Strain: Not on file  Food Insecurity: Not on file  Transportation Needs: Not on file  Physical Activity: Not on file  Stress: Not on file  Social Connections: Not on  file    Allergies: No Known Allergies  Metabolic Disorder Labs: Lab Results  Component Value Date   HGBA1C 5.3 02/23/2021   No results found for: "PROLACTIN" Lab Results  Component Value Date   CHOL 197 12/28/2021   TRIG 145 12/28/2021   HDL 62 12/28/2021   CHOLHDL 3.7 02/23/2021   LDLCALC 110 (H) 12/28/2021   LDLCALC 126 (H) 02/23/2021   Lab Results  Component Value Date   TSH 0.996 02/23/2021    Therapeutic Level Labs: No results found for: "LITHIUM" No results found for: "VALPROATE" No results found for: "CBMZ"  Current Medications: Current Outpatient Medications  Medication Sig Dispense Refill   clobetasol ointment (TEMOVATE) 0.05 % Apply topically 2 (two) times a week. 60 g 3   clonazePAM (KLONOPIN) 0.5 MG tablet Take 0.5-1 tablets (0.25-0.5 mg total) by mouth daily as needed for anxiety. Take as needed for severe anxiety, grief- please limit use 21 tablet 0   latanoprost (XALATAN) 0.005 % ophthalmic solution SMARTSIG:1 In Eye(s) Every Night     timolol (TIMOPTIC) 0.5 % ophthalmic solution SMARTSIG:In Eye(s)     tolterodine (DETROL LA) 2 MG 24 hr capsule Take 1 capsule (2 mg total) by mouth daily. 30 capsule 2   buPROPion (WELLBUTRIN) 75 MG tablet Take 1 tablet (75 mg total) by mouth daily with breakfast. 90 tablet 1   Cholecalciferol (VITAMIN D3) 10 MCG (400 UNIT) tablet Take 400 Units by mouth daily. (Patient not taking: Reported on 06/29/2022)     mirtazapine (REMERON) 7.5 MG tablet Take 1 tablet (7.5 mg total) by mouth at bedtime. 90 tablet 1   predniSONE (DELTASONE) 20 MG tablet Take 60mg  PO daily x 2 days, then40mg  PO daily x 2 days, then 20mg  PO daily x 3 days 13 tablet 0   No current facility-administered medications for this visit.     Musculoskeletal: Strength & Muscle Tone: within normal limits Gait & Station: normal Patient leans: N/A  Psychiatric Specialty Exam: Review of Systems  Musculoskeletal:  Positive for neck pain.   Psychiatric/Behavioral:  Positive for sleep disturbance. The patient is nervous/anxious.   All other systems reviewed and are negative.   Blood pressure (!) 144/79, pulse 68, temperature (!) 97.4 F (36.3 C), temperature source Oral, height 5\' 2"  (1.575 m), weight 152 lb 9.6 oz (69.2 kg), SpO2 97 %.Body mass index is 27.91 kg/m.  General Appearance: Casual  Eye Contact:  Fair  Speech:  Clear and Coherent  Volume:  Normal  Mood:  Anxious  Affect:  Appropriate  Thought Process:  Goal Directed and Descriptions of Associations: Intact  Orientation:  Full (Time, Place, and Person)  Thought Content: Logical   Suicidal Thoughts:  No  Homicidal Thoughts:  No  Memory:  Immediate;   Fair Recent;   Fair Remote;   Fair  Judgement:  Fair  Insight:  Good  Psychomotor Activity:  Normal  Concentration:  Concentration: Fair and Attention Span: Fair  Recall:  [employer] of Knowledge: Fair  Language: Fair  Akathisia:  No  Handed:  Right  AIMS (if indicated): not done  Assets:  Communication Skills Desire for Improvement Housing Social Support  ADL's:  Intact  Cognition: WNL  Sleep:   Restless since the past few days due to neck pain   Screenings: [place] Office Visit from 01/04/2022 in [place] Office Visit from 11/04/2021 in [place] Office Visit from 09/23/2021 in [place] Total Score 0 0 0      [place] Office Visit from 06/29/2022 in [place] Office Visit from 01/04/2022 in [place] Office Visit from 12/28/2021 in [place] Office Visit from 11/02/2021 in [place] Office Visit from 10/27/2021 in [place]  Total GAD-7 Score 12 7 13 7 14       PHQ2-9     [place] Visit from 06/29/2022 in [place] Office Visit from 01/04/2022 in [place] Office Visit from 12/28/2021 in [place] Office Visit from 11/04/2021 in [place] Office Visit from 11/02/2021 in [place]  PHQ-2 Total Score 2 2 4 2 2   PHQ-9 Total Score 5 8 10 4 4       [place] Visit from 06/29/2022 in [place] Office Visit from 01/04/2022 in [place] Office Visit from 11/04/2021 in [place] No Risk Error: Q3, 4, or 5 should not be populated when Q2 is No Low Risk        Assessment and Plan: Y[place]s is a 77 year old Caucasian female, married, retired, lives in [place], has a history of PTSD, MDD, prolonged grief, multiple medical problems was evaluated in office today.  Patient is currently struggling with anxiety, agreeable to restart psychotherapy sessions.  She also has pain and will need pain management.  Plan as noted below.  Plan PTSD-improving Mirtazapine 7.5 mg p.o. nightly Continue CBT with Ms. Miguel Dibble.  MDD in remission Wellbutrin 75 mg p.o. daily Mirtazapine 7.5 mg p.o. nightly   Prolonged grief reaction-improving Continue CBT Klonopin 0.5 mg as needed for severe anxiety attacks.  GAD-unstable Patient to restart CBT Continue mirtazapine 7.5 mg p.o. nightly Patient declines medication changes.  Patient encouraged to follow up with her primary care provider for management of her neck pain.  Patient with elevated blood pressure reading advised to follow up with primary care provider likely due to pain.  Follow-up in clinic in 3 months or sooner if needed.   This note was generated in part or whole with  voice recognition software. Voice recognition is usually quite accurate but there are transcription errors that can and very often do occur. I apologize for any typographical errors that were not detected and corrected.     Ursula Alert, MD 06/30/2022, 12:52 PM

## 2022-06-30 ENCOUNTER — Ambulatory Visit: Payer: Federal, State, Local not specified - PPO | Admitting: Physician Assistant

## 2022-06-30 ENCOUNTER — Encounter: Payer: Self-pay | Admitting: Physician Assistant

## 2022-06-30 VITALS — BP 141/82 | HR 69 | Temp 97.6°F | Ht 62.01 in | Wt 153.6 lb

## 2022-06-30 DIAGNOSIS — R944 Abnormal results of kidney function studies: Secondary | ICD-10-CM | POA: Diagnosis not present

## 2022-06-30 DIAGNOSIS — M436 Torticollis: Secondary | ICD-10-CM

## 2022-06-30 DIAGNOSIS — E538 Deficiency of other specified B group vitamins: Secondary | ICD-10-CM | POA: Diagnosis not present

## 2022-06-30 DIAGNOSIS — E785 Hyperlipidemia, unspecified: Secondary | ICD-10-CM

## 2022-06-30 DIAGNOSIS — Z131 Encounter for screening for diabetes mellitus: Secondary | ICD-10-CM

## 2022-06-30 DIAGNOSIS — Z636 Dependent relative needing care at home: Secondary | ICD-10-CM

## 2022-06-30 DIAGNOSIS — F4310 Post-traumatic stress disorder, unspecified: Secondary | ICD-10-CM

## 2022-06-30 MED ORDER — PREDNISONE 20 MG PO TABS: ORAL_TABLET | ORAL | 0 refills | Status: DC

## 2022-06-30 NOTE — Assessment & Plan Note (Signed)
Chronic, ongoing historic condition Does not appear to managed medically at this time Will recheck lipids today - results to dictate further management Follow up in 6 months

## 2022-06-30 NOTE — Progress Notes (Signed)
Established Patient Office Visit  Name: Virginia West   MRN: [id number]    DOB: 11/19/1945   Date:06/30/2022  Today's Provider: Talitha Givens, MHS, PA-C Introduced myself to the patient as a PA-C and provided education on APPs in clinical practice.         Subjective  Chief Complaint  Chief Complaint  Patient presents with   Neck Pain    For past 2 weeks, hard to move head side to side   Depression   Caregiver Stress    Neck Pain   Depression        Associated symptoms include myalgias.  Associated symptoms include no suicidal ideas.    Neck Pain  Onset: sudden  Duration: 2 weeks  Location: left side of neck into ear with turning head, some pain along the back of the neck Pain level and character: reports neck feels stiff, and is very painful unless it is held still  Interventions: tried her old neck PT exercises but this seemed to make it worse, Tylenol  Alleviating: nothing unless she can arrange pillows correctly at night  Aggravating: turning head is very painful, trying to lay down   She has to be careful with NSAIDs so she hasn't taken these much but it seems to be more beneficial  She denies recent injuries or trauma to the area    DEPRESSION Mood status:  reports it is worse everyday  She is still meeting with psychiatry - last apt was yesterday  Satisfied with current treatment?: no Symptom severity:  Unsure of severity, reviewed PHQ 9 results but her responses to questions indicate severe impact on day to day mood    Duration of current treatment : years Side effects: no Medication compliance: excellent compliance Psychotherapy/counseling: yes in the past- has not been in the last few months  Previous psychiatric medications: wellbutrin Depressed mood: yes Anxious mood: yes Anhedonia: no Significant weight loss or gain: no Insomnia: yes hard to stay asleep Fatigue: yes Feelings of worthlessness or guilt: no Impaired  concentration/indecisiveness: no Suicidal ideations: no Hopelessness: yes Crying spells: yes    06/30/2022   10:38 AM 06/29/2022   11:24 AM 01/04/2022   10:44 AM 12/28/2021   10:38 AM 11/04/2021   11:03 AM  Depression screen PHQ 2/9  Decreased Interest 0   2   Down, Depressed, Hopeless 0   2   PHQ - 2 Score 0   4   Altered sleeping 3   2   Tired, decreased energy 2   2   Change in appetite 0   0   Feeling bad or failure about yourself  1   2   Trouble concentrating 0   0   Moving slowly or fidgety/restless 0   0   Suicidal thoughts 0   0   PHQ-9 Score 6   10   Difficult doing work/chores Somewhat difficult   Somewhat difficult      Information is confidential and restricted. Go to Review Flowsheets to unlock data.       06/30/2022   10:38 AM 06/29/2022   11:24 AM 01/04/2022   10:45 AM 12/28/2021   10:39 AM  GAD 7 : Generalized Anxiety Score  Nervous, Anxious, on Edge 1   2  Control/stop worrying 1   2  Worry too much - different things 1   2  Trouble relaxing 2   2  Restless 2   1  Easily annoyed or irritable 2   2  Afraid - awful might happen 2   2  Total GAD 7 Score 11   13  Anxiety Difficulty Somewhat difficult   Somewhat difficult     Information is confidential and restricted. Go to Review Flowsheets to unlock data.         Patient Active Problem List   Diagnosis Date Noted   MDD (major depressive disorder), recurrent, in partial remission ([place]) 06/29/2022   Decreased GFR 12/28/2021   Hyperlipidemia 12/28/2021   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   Hip pain 11/02/2021   Pain of right hip 10/27/2021   Rash 10/27/2021   Bereavement 07/16/2021   Severe episode of recurrent major depressive disorder, without psychotic features ([place]) 07/16/2021   Depression, recurrent ([place]) 05/25/2021   Current mild episode of major depressive disorder ([place]) 04/27/2021   Caregiver stress 04/27/2021   Colon cancer screening    Urge incontinence of urine 03/16/2021   Elevated serum  creatinine 03/16/2021   PTSD (post-traumatic stress disorder) 02/23/2021   Vitamin D deficiency 02/23/2021   Neuropathy 02/23/2021   B12 deficiency 02/23/2021   Screen for colon cancer 02/23/2021   Screening for osteoporosis 02/23/2021   Weight loss 02/23/2021   Encounter for screening for malignant neoplasm of breast 02/23/2021   Need for influenza vaccination 02/23/2021   Glaucoma 02/23/2021   Screening for cervical cancer 02/23/2021    Past Surgical History:  Procedure Laterality Date   ABDOMINAL HYSTERECTOMY     APPENDECTOMY     CHOLECYSTECTOMY     COLONOSCOPY WITH PROPOFOL N/A 03/24/2021   Procedure: COLONOSCOPY WITH PROPOFOL;  Surgeon: Lin Landsman, MD;  Location[facility] END[facility]COPY;  Service: Gastroenterology;  Laterality: N/A;    Family History  Problem Relation Age of Onset   Heart disease Mother    Heart disease Father    Cancer Brother    Breast cancer Maternal Aunt 41   Cancer Son    Depression Son     Social History   Tobacco Use   Smoking status: Never    Passive exposure: Past   Smokeless tobacco: Never  Substance Use Topics   Alcohol use: Not Currently     Current Outpatient Medications:    buPROPion (WELLBUTRIN) 75 MG tablet, Take 1 tablet (75 mg total) by mouth daily with breakfast., Disp: 90 tablet, Rfl: 1   clobetasol ointment (TEMOVATE) 0.05 %, Apply topically 2 (two) times a week., Disp: 60 g, Rfl: 3   clonazePAM (KLONOPIN) 0.5 MG tablet, Take 0.5-1 tablets (0.25-0.5 mg total) by mouth daily as needed for anxiety. Take as needed for severe anxiety, grief- please limit use, Disp: 21 tablet, Rfl: 0   latanoprost (XALATAN) 0.005 % ophthalmic solution, SMARTSIG:1 In Eye(s) Every Night, Disp: , Rfl:    mirtazapine (REMERON) 7.5 MG tablet, Take 1 tablet (7.5 mg total) by mouth at bedtime., Disp: 90 tablet, Rfl: 1   predniSONE (DELTASONE) 20 MG tablet, Take 60mg  PO daily x 2 days, then40mg  PO daily x 2 days, then 20mg  PO daily x 3 days, Disp: 13  tablet, Rfl: 0   timolol (TIMOPTIC) 0.5 % ophthalmic solution, SMARTSIG:In Eye(s), Disp: , Rfl:    tolterodine (DETROL LA) 2 MG 24 hr capsule, Take 1 capsule (2 mg total) by mouth daily., Disp: 30 capsule, Rfl: 2   Cholecalciferol (VITAMIN D3) 10 MCG (400 UNIT) tablet, Take 400 Units by mouth daily. (Patient not taking: Reported on 06/29/2022), Disp: , Rfl:   No Known  Allergies  I personally reviewed active problem list, medication list, allergies, health maintenance, notes from last encounter, lab results with the patient/caregiver today.   Review of Systems  Musculoskeletal:  Positive for myalgias and neck pain.  Psychiatric/Behavioral:  Positive for depression. Negative for suicidal ideas. The patient is nervous/anxious.       Objective  Vitals:   06/30/22 1013  BP: (!) 141/82  Pulse: 69  Temp: 97.6 F (36.4 C)  TempSrc: Oral  SpO2: 98%  Weight: 153 lb 9.6 oz (69.7 kg)  Height: 5' 2.01" (1.575 m)    Body mass index is 28.09 kg/m.  Physical Exam Vitals reviewed.  Constitutional:      General: She is awake.     Appearance: Normal appearance. She is well-developed and well-groomed.  HENT:     Head: Normocephalic and atraumatic.  Musculoskeletal:     Cervical back: Rigidity, spasms and torticollis present. No swelling, edema, deformity, erythema, tenderness or bony tenderness. Pain with movement present. Decreased range of motion.  Neurological:     General: No focal deficit present.     Mental Status: She is alert and oriented to person, place, and time. Mental status is at baseline.  Psychiatric:        Mood and Affect: Mood normal.        Behavior: Behavior normal. Behavior is cooperative.        Thought Content: Thought content normal.        Judgment: Judgment normal.      Recent Results (from the past 2160 hour(s))  Resp Panel by RT-PCR (Flu A&B, Covid) Anterior Nasal Swab     Status: None   Collection Time: 04/16/22  1:46 PM   Specimen: Anterior Nasal  Swab  Result Value Ref Range   SARS Coronavirus 2 by RT PCR NEGATIVE NEGATIVE    Comment: (NOTE) SARS-CoV-2 target nucleic acids are NOT DETECTED.  The SARS-CoV-2 RNA is generally detectable in upper respiratory specimens during the acute phase of infection. The lowest concentration of SARS-CoV-2 viral copies this assay can detect is 138 copies/mL. A negative result does not preclude SARS-Cov-2 infection and should not be used as the sole basis for treatment or other patient management decisions. A negative result may occur with  improper specimen collection/handling, submission of specimen other than nasopharyngeal swab, presence of viral mutation(s) within the areas targeted by this assay, and inadequate number of viral copies(<138 copies/mL). A negative result must be combined with clinical observations, patient history, and epidemiological information. The expected result is Negative.  Fact Sheet for Patients:  BloggerCourse.com  Fact Sheet for Healthcare Providers:  SeriousBroker.it  This test is no t yet approved or cleared by the [place] FDA and  has been authorized for detection and/or diagnosis of SARS-CoV-2 by FDA under an Emergency Use Authorization (EUA). This EUA will remain  in effect (meaning this test can be used) for the duration of the COVID-19 declaration under Section 564(b)(1) of the Act, 21 U.S.C.section 360bbb-3(b)(1), unless the authorization is terminated  or revoked sooner.       Influenza A by PCR NEGATIVE NEGATIVE   Influenza B by PCR NEGATIVE NEGATIVE    Comment: (NOTE) The Xpert Xpress SARS-CoV-2/FLU/RSV plus assay is intended as an aid in the diagnosis of influenza from Nasopharyngeal swab specimens and should not be used as a sole basis for treatment. Nasal washings and aspirates are unacceptable for Xpert Xpress SARS-CoV-2/FLU/RSV testing.  Fact Sheet for  Patients: BloggerCourse.com  Fact Sheet for  Healthcare Providers: SeriousBroker.it  This test is not yet approved or cleared by the [place] and has been authorized for detection and/or diagnosis of SARS-CoV-2 by FDA under an Emergency Use Authorization (EUA). This EUA will remain in effect (meaning this test can be used) for the duration of the COVID-19 declaration under Section 564(b)(1) of the Act, 21 U.S.C. section 360bbb-3(b)(1), unless the authorization is terminated or revoked.  Performed at [facility], [address] [place], [place] [address]      PHQ2/9:    06/30/2022   10:38 AM 06/29/2022   11:24 AM 01/04/2022   10:44 AM 12/28/2021   10:38 AM 11/04/2021   11:03 AM  Depression screen PHQ 2/9  Decreased Interest 0   2   Down, Depressed, Hopeless 0   2   PHQ - 2 Score 0   4   Altered sleeping 3   2   Tired, decreased energy 2   2   Change in appetite 0   0   Feeling bad or failure about yourself  1   2   Trouble concentrating 0   0   Moving slowly or fidgety/restless 0   0   Suicidal thoughts 0   0   PHQ-9 Score 6   10   Difficult doing work/chores Somewhat difficult   Somewhat difficult      Information is confidential and restricted. Go to Review Flowsheets to unlock data.      Fall Risk:    06/30/2022   10:38 AM 12/28/2021   10:38 AM 11/02/2021   10:53 AM 10/27/2021    2:10 PM 06/15/2021   11:10 AM  Fall Risk   Falls in the past year? 0 0 0 0 0  Number falls in past yr: 0 0 0 0 0  Injury with Fall? 0 0 0 0 0  Risk for fall due to : No Fall Risks No Fall Risks No Fall Risks No Fall Risks No Fall Risks  Follow up Falls evaluation completed Falls evaluation completed Falls evaluation completed Falls evaluation completed Falls evaluation completed      Functional Status Survey:      Assessment & Plan  Problem List Items Addressed This Visit       Other   PTSD (post-traumatic  stress disorder)    Seeing Psychiatry and is set up with therapy services  Will defer to their recommendations for management       B12 deficiency    Appears to be chronic, does not appear to be under current management  Last results was 12/2021 and was in normal range  Will recheck today and manage as indicated Follow up as needed        Relevant Orders   CBC w/Diff   B12   Caregiver stress    Chronic, ongoing  She is caring for her husband with Alzheimer's disease which is significant stress burden  She is followed with psychiatry and reports intermittently meeting with therapy services Psychiatry is managing her medications at this time Continue with their recommendations at this time.        Decreased GFR    Chronic, ongoing Recheck CMP today - results to dictate further management Follow up in 6 months       Relevant Orders   Comp Met (CMET)   Hyperlipidemia    Chronic, ongoing historic condition Does not appear to managed medically at this time Will recheck lipids today - results to dictate  further management Follow up in 6 months      Relevant Orders   Lipid Profile   Other Visit Diagnoses     Torticollis, acute    -  Primary Acute, new concern Reports neck stiffness for about 2 weeks with decreased ROM  PE appears most consistent with acute torticollis  Recommend Tylenol, Voltaren gel, warm compresses and gentle stretches Will also provide prednisone taper to assist with inflammation  Follow up as needed for persistent or progressing symptoms    Relevant Medications   predniSONE (DELTASONE) 20 MG tablet   Screening for diabetes mellitus (DM)       Relevant Orders   HgB A1c        Return in about 6 months (around 12/29/2022) for Depression.   I, Carmelita Amparo E Raghav Verrilli, PA-C, have reviewed all documentation for this visit. The documentation on 06/30/22 for the exam, diagnosis, procedures, and orders are all accurate and complete.   Talitha Givens, MHS,  PA-C [facility]

## 2022-06-30 NOTE — Assessment & Plan Note (Signed)
Chronic, ongoing  She is caring for her husband with Alzheimer's disease which is significant stress burden  She is followed with psychiatry and reports intermittently meeting with therapy services Psychiatry is managing her medications at this time Continue with their recommendations at this time.

## 2022-06-30 NOTE — Assessment & Plan Note (Signed)
Chronic, ongoing Recheck CMP today - results to dictate further management Follow up in 6 months

## 2022-06-30 NOTE — Patient Instructions (Addendum)
For your neck I recommend the following:  Using tylenol for pain You can also use Voltaren gel on the neck to help with inflammation Warm compresses- 20 minutes on, 30 minutes off  Gentle stretching and massage  I have sent in a script for Prednisone to help with the inflammation Please take in the morning with breakfast as it can cause sleeplessness  It was nice to meet you and I appreciate the opportunity to be involved in your care If you were satisfied with the care you received from me, I would greatly appreciate you saying so in the after-visit survey that is sent out following our visit.

## 2022-06-30 NOTE — Assessment & Plan Note (Signed)
Seeing Psychiatry and is set up with therapy services  Will defer to their recommendations for management

## 2022-06-30 NOTE — Assessment & Plan Note (Signed)
Appears to be chronic, does not appear to be under current management  Last results was 12/2021 and was in normal range  Will recheck today and manage as indicated Follow up as needed

## 2022-07-01 LAB — CBC WITH DIFFERENTIAL/PLATELET
Basophils Absolute: 0.1 10*3/uL (ref 0.0–0.2)
Basos: 1 %
EOS (ABSOLUTE): 0.5 10*3/uL — ABNORMAL HIGH (ref 0.0–0.4)
Eos: 5 %
Hematocrit: 40 % (ref 34.0–46.6)
Hemoglobin: 13.5 g/dL (ref 11.1–15.9)
Immature Grans (Abs): 0 10*3/uL (ref 0.0–0.1)
Immature Granulocytes: 0 %
Lymphocytes Absolute: 1.8 10*3/uL (ref 0.7–3.1)
Lymphs: 18 %
MCH: 29.8 pg (ref 26.6–33.0)
MCHC: 33.8 g/dL (ref 31.5–35.7)
MCV: 88 fL (ref 79–97)
Monocytes Absolute: 0.8 10*3/uL (ref 0.1–0.9)
Monocytes: 8 %
Neutrophils Absolute: 7.1 10*3/uL — ABNORMAL HIGH (ref 1.4–7.0)
Neutrophils: 68 %
Platelets: 336 10*3/uL (ref 150–450)
RBC: 4.53 x10E6/uL (ref 3.77–5.28)
RDW: 12.7 % (ref 11.7–15.4)
WBC: 10.3 10*3/uL (ref 3.4–10.8)

## 2022-07-01 LAB — COMPREHENSIVE METABOLIC PANEL
ALT: 13 IU/L (ref 0–32)
AST: 17 IU/L (ref 0–40)
Albumin/Globulin Ratio: 1.3 (ref 1.2–2.2)
Albumin: 4.3 g/dL (ref 3.8–4.8)
Alkaline Phosphatase: 86 IU/L (ref 44–121)
BUN/Creatinine Ratio: 19 (ref 12–28)
BUN: 29 mg/dL — ABNORMAL HIGH (ref 8–27)
Bilirubin Total: 0.2 mg/dL (ref 0.0–1.2)
CO2: 20 mmol/L (ref 20–29)
Calcium: 9.6 mg/dL (ref 8.7–10.3)
Chloride: 100 mmol/L (ref 96–106)
Creatinine, Ser: 1.52 mg/dL — ABNORMAL HIGH (ref 0.57–1.00)
Globulin, Total: 3.2 g/dL (ref 1.5–4.5)
Glucose: 77 mg/dL (ref 70–99)
Potassium: 5.2 mmol/L (ref 3.5–5.2)
Sodium: 136 mmol/L (ref 134–144)
Total Protein: 7.5 g/dL (ref 6.0–8.5)
eGFR: 35 mL/min/{1.73_m2} — ABNORMAL LOW (ref >59)

## 2022-07-01 LAB — LIPID PANEL
Chol/HDL Ratio: 3.5 ratio (ref 0.0–4.4)
Cholesterol, Total: 176 mg/dL (ref 100–199)
HDL: 50 mg/dL (ref >39)
LDL Chol Calc (NIH): 106 mg/dL — ABNORMAL HIGH (ref 0–99)
Triglycerides: 109 mg/dL (ref 0–149)
VLDL Cholesterol Cal: 20 mg/dL (ref 5–40)

## 2022-07-01 LAB — VITAMIN B12: Vitamin B-12: 498 pg/mL (ref 232–1245)

## 2022-07-01 LAB — HEMOGLOBIN A1C
Est. average glucose Bld gHb Est-mCnc: 114 mg/dL
Hgb A1c MFr Bld: 5.6 % (ref 4.8–5.6)

## 2022-07-04 NOTE — Progress Notes (Signed)
Your cholesterol appears to be pretty good- your LDL is mildly elevated but this seems to be consistent with previous labs for you. Your kidney function appears to have decreased a bit since it was checked 6 months ago. At this time you would likely be considered to have Stage 3b chronic kidney disease. I think we should refer you to Nephrology so we can make sure we are preserving your function and protecting your kidneys as much as possible. Let me know if you would like me to refer you to this specialty Your electrolytes and liver function appear to be overall stable and in goal ranges CBC and B12 were stable and overall normal  A1c was in normal range

## 2022-07-08 NOTE — Progress Notes (Signed)
Nephrology referral sent per request

## 2022-07-08 NOTE — Addendum Note (Signed)
Addended by: Talitha Givens on: 07/08/2022 11:53 AM   Modules accepted: Orders

## 2022-07-13 ENCOUNTER — Ambulatory Visit: Payer: Self-pay

## 2022-07-13 NOTE — Telephone Encounter (Signed)
  Chief Complaint: neck pain and stiffness,  Symptoms: pain now to shoulders  Frequency: began after done with steroids x 1 week Pertinent Negatives: Patient denies headache, fever, SOB, neck swelling Disposition: [] ED /[] Urgent Care (no appt availability in office) / [x] Appointment(In office/virtual)/ []  [facility]/ [] Home Care/ [] Refused Recommended Disposition /[] [facility]/ []  Follow-up with PCP Additional Notes: appt to see PCP tomorrow Reason for Disposition  [1] MODERATE neck pain (e.g., interferes with normal activities) AND [2] present > 3 days  Answer Assessment - Initial Assessment Questions 1. ONSET: "When did the pain begin?"      *No Answer* 2. LOCATION: "Where does it hurt?"      Since end Jan 3. PATTERN "Does the pain come and go, or has it been constant since it started?"      constant 4. SEVERITY: "How bad is the pain?"  (Scale 1-10; or mild, moderate, severe)   - NO PAIN (0): no pain or only slight stiffness    - MILD (1-3): doesn't interfere with normal activities    - MODERATE (4-7): interferes with normal activities or awakens from sleep    - SEVERE (8-10):  excruciating pain, unable to do any normal activities      moderate 5. RADIATION: "Does the pain go anywhere else, shoot into your arms?"     Neck shoulders 6. CORD SYMPTOMS: "Any weakness or numbness of the arms or legs?"     no 7. CAUSE: "What do you think is causing the neck pain?"     Seen in office for it and was rx sterids and pain is back after end of steroids 8. NECK OVERUSE: "Any recent activities that involved turning or twisting the neck?"     no 9. OTHER SYMPTOMS: "Do you have any other symptoms?" (e.g., headache, fever, chest pain, difficulty breathing, neck swelling)     no  Protocols used: Neck Pain or Stiffness-A-AH

## 2022-07-14 ENCOUNTER — Encounter: Payer: Self-pay | Admitting: Nurse Practitioner

## 2022-07-14 ENCOUNTER — Ambulatory Visit (INDEPENDENT_AMBULATORY_CARE_PROVIDER_SITE_OTHER): Payer: Federal, State, Local not specified - PPO | Admitting: Nurse Practitioner

## 2022-07-14 VITALS — BP 135/76 | HR 76 | Temp 98.7°F | Wt 154.9 lb

## 2022-07-14 DIAGNOSIS — N1831 Chronic kidney disease, stage 3a: Secondary | ICD-10-CM

## 2022-07-14 DIAGNOSIS — N183 Chronic kidney disease, stage 3 unspecified: Secondary | ICD-10-CM | POA: Insufficient documentation

## 2022-07-14 DIAGNOSIS — M62838 Other muscle spasm: Secondary | ICD-10-CM | POA: Diagnosis not present

## 2022-07-14 MED ORDER — METHOCARBAMOL 500 MG PO TABS: 500.0000 mg | ORAL_TABLET | Freq: Three times a day (TID) | ORAL | 0 refills | Status: DC | PRN

## 2022-07-14 MED ORDER — TRIAMCINOLONE ACETONIDE 40 MG/ML IJ SUSP
40.0000 mg | Freq: Once | INTRAMUSCULAR | Status: AC
  Administered 2022-07-14: 40 mg via INTRAMUSCULAR

## 2022-07-14 NOTE — Progress Notes (Signed)
BP 135/76   Pulse 76   Temp 98.7 F (37.1 C) (Oral)   Wt 154 lb 14.4 oz (70.3 kg)   SpO2 98%   BMI 28.32 kg/m    Subjective:    Patient ID: Virginia West, female    DOB: [birth year], 77 y.o.   MRN: [id number]  HPI: Virginia West is a 77 y.o. female  Chief Complaint  Patient presents with   Pain    Pt states she has been dealing with neck stiffness and pain for the last month. States she saw Junie Panning about 2 weeks ago, given Prednisone which helped. States tylenol is not helping and has been using Voltaren Gel as well.    NECK PAIN FOLLOW UP Status:  muscle spasm- was seen earlier in the month.  Steriod pack helped symptoms.  But then 5-6 days after finishing the steroids the pain came back.  Treatments attempted:  steroids, tylneol and voltaren   Compliant with recommended treatment: yes Relief with NSAIDs?:  No NSAIDs Taken Location:Right, Left, and midline Duration:weeks Severity: 8/10 Constant pain Radiation: none Aggravating factors: movement Alleviating factors:  prednisone and voltaren, rest, and heat Weakness:  no Paresthesias / decreased sensation:  no  Fevers:  no  Relevant past medical, surgical, family and social history reviewed and updated as indicated. Interim medical history since our last visit reviewed. Allergies and medications reviewed and updated.  Review of Systems  Musculoskeletal:  Positive for neck pain.    Per HPI unless specifically indicated above     Objective:    BP 135/76   Pulse 76   Temp 98.7 F (37.1 C) (Oral)   Wt 154 lb 14.4 oz (70.3 kg)   SpO2 98%   BMI 28.32 kg/m   Wt Readings from Last 3 Encounters:  07/14/22 154 lb 14.4 oz (70.3 kg)  06/30/22 153 lb 9.6 oz (69.7 kg)  05/25/22 151 lb 11.2 oz (68.8 kg)    Physical Exam Vitals and nursing note reviewed.  Constitutional:      General: She is not in acute distress.    Appearance: Normal appearance. She is normal weight. She is not ill-appearing, toxic-appearing or  diaphoretic.  HENT:     Head: Normocephalic.     Right Ear: External ear normal.     Left Ear: External ear normal.     Nose: Nose normal.     Mouth/Throat:     Mouth: Mucous membranes are moist.     Pharynx: Oropharynx is clear.  Eyes:     General:        Right eye: No discharge.        Left eye: No discharge.     Extraocular Movements: Extraocular movements intact.     Conjunctiva/sclera: Conjunctivae normal.     Pupils: Pupils are equal, round, and reactive to light.  Cardiovascular:     Rate and Rhythm: Normal rate and regular rhythm.     Heart sounds: No murmur heard. Pulmonary:     Effort: Pulmonary effort is normal. No respiratory distress.     Breath sounds: Normal breath sounds. No wheezing or rales.  Musculoskeletal:     Cervical back: Neck supple. No edema, rigidity or crepitus. Pain with movement and muscular tenderness present. Decreased range of motion.  Skin:    General: Skin is warm and dry.     Capillary Refill: Capillary refill takes less than 2 seconds.  Neurological:     General: No focal deficit present.  Mental Status: She is alert and oriented to person, place, and time. Mental status is at baseline.  Psychiatric:        Mood and Affect: Mood normal.        Behavior: Behavior normal.        Thought Content: Thought content normal.        Judgment: Judgment normal.     Results for orders placed or performed in visit on 06/30/22  Lipid Profile  Result Value Ref Range   Cholesterol, Total 176 100 - 199 mg/dL   Triglycerides 109 0 - 149 mg/dL   HDL 50 >39 mg/dL   VLDL Cholesterol Cal 20 5 - 40 mg/dL   LDL Chol Calc (NIH) 106 (H) 0 - 99 mg/dL   Chol/HDL Ratio 3.5 0.0 - 4.4 ratio  Comp Met (CMET)  Result Value Ref Range   Glucose 77 70 - 99 mg/dL   BUN 29 (H) 8 - 27 mg/dL   Creatinine, Ser 1.52 (H) 0.57 - 1.00 mg/dL   eGFR 35 (L) >59 mL/min/1.73   BUN/Creatinine Ratio 19 12 - 28   Sodium 136 134 - 144 mmol/L   Potassium 5.2 3.5 - 5.2 mmol/L    Chloride 100 96 - 106 mmol/L   CO2 20 20 - 29 mmol/L   Calcium 9.6 8.7 - 10.3 mg/dL   Total Protein 7.5 6.0 - 8.5 g/dL   Albumin 4.3 3.8 - 4.8 g/dL   Globulin, Total 3.2 1.5 - 4.5 g/dL   Albumin/Globulin Ratio 1.3 1.2 - 2.2   Bilirubin Total 0.2 0.0 - 1.2 mg/dL   Alkaline Phosphatase 86 44 - 121 IU/L   AST 17 0 - 40 IU/L   ALT 13 0 - 32 IU/L  CBC w/Diff  Result Value Ref Range   WBC 10.3 3.4 - 10.8 x10E3/uL   RBC 4.53 3.77 - 5.28 x10E6/uL   Hemoglobin 13.5 11.1 - 15.9 g/dL   Hematocrit 40.0 34.0 - 46.6 %   MCV 88 79 - 97 fL   MCH 29.8 26.6 - 33.0 pg   MCHC 33.8 31.5 - 35.7 g/dL   RDW 12.7 11.7 - 15.4 %   Platelets 336 150 - 450 x10E3/uL   Neutrophils 68 Not Estab. %   Lymphs 18 Not Estab. %   Monocytes 8 Not Estab. %   Eos 5 Not Estab. %   Basos 1 Not Estab. %   Neutrophils Absolute 7.1 (H) 1.4 - 7.0 x10E3/uL   Lymphocytes Absolute 1.8 0.7 - 3.1 x10E3/uL   Monocytes Absolute 0.8 0.1 - 0.9 x10E3/uL   EOS (ABSOLUTE) 0.5 (H) 0.0 - 0.4 x10E3/uL   Basophils Absolute 0.1 0.0 - 0.2 x10E3/uL   Immature Granulocytes 0 Not Estab. %   Immature Grans (Abs) 0.0 0.0 - 0.1 x10E3/uL  B12  Result Value Ref Range   Vitamin B-12 498 232 - 1,245 pg/mL  HgB A1c  Result Value Ref Range   Hgb A1c MFr Bld 5.6 4.8 - 5.6 %   Est. average glucose Bld gHb Est-mCnc 114 mg/dL      Assessment & Plan:   Problem List Items Addressed This Visit       Genitourinary   CKD (chronic kidney disease), stage III (HCC) - Primary    Chronic.  Lengthy discussion had regarding labs.  Has an appointment with Dr. Holley Raring coming up.  Will recheck CMP at visit today.        Other Visit Diagnoses     Muscle spasm  Ongoing concern. Prednisone improved pain. Will give Kenalog shot in office. Will give Methocarbamol. Side effects and benefits discussed. Referral to PT.   Relevant Medications   triamcinolone acetonide (KENALOG-40) injection 40 mg (Completed)   Other Relevant Orders   Ambulatory  referral to Physical Therapy        Follow up plan: Return in about 2 weeks (around 07/28/2022) for Neck pain.

## 2022-07-15 NOTE — Assessment & Plan Note (Signed)
Chronic.  Lengthy discussion had regarding labs.  Has an appointment with Dr. Holley Raring coming up.  Will recheck CMP at visit today.

## 2022-07-21 ENCOUNTER — Other Ambulatory Visit: Payer: Self-pay | Admitting: Nephrology

## 2022-07-21 DIAGNOSIS — N1832 Chronic kidney disease, stage 3b: Secondary | ICD-10-CM

## 2022-07-25 ENCOUNTER — Other Ambulatory Visit: Payer: Self-pay | Admitting: Obstetrics and Gynecology

## 2022-07-25 DIAGNOSIS — N3941 Urge incontinence: Secondary | ICD-10-CM

## 2022-08-01 ENCOUNTER — Encounter: Payer: Self-pay | Admitting: Nurse Practitioner

## 2022-08-01 ENCOUNTER — Ambulatory Visit
Admission: RE | Admit: 2022-08-01 | Discharge: 2022-08-01 | Disposition: A | Discharge status: Home / Self Care | Payer: Federal, State, Local not specified - PPO | Source: Ambulatory Visit | Attending: Nurse Practitioner | Admitting: Nurse Practitioner

## 2022-08-01 ENCOUNTER — Ambulatory Visit: Payer: Federal, State, Local not specified - PPO | Admitting: Nurse Practitioner

## 2022-08-01 ENCOUNTER — Ambulatory Visit
Admission: RE | Admit: 2022-08-01 | Discharge: 2022-08-01 | Disposition: A | Discharge status: Home / Self Care | Payer: Federal, State, Local not specified - PPO | Attending: Nurse Practitioner | Admitting: Nurse Practitioner

## 2022-08-01 ENCOUNTER — Ambulatory Visit: Payer: Federal, State, Local not specified - PPO | Attending: Nurse Practitioner | Admitting: Physical Therapy

## 2022-08-01 ENCOUNTER — Encounter: Payer: Self-pay | Admitting: Physical Therapy

## 2022-08-01 VITALS — BP 136/70 | HR 77 | Temp 98.1°F | Wt 154.0 lb

## 2022-08-01 DIAGNOSIS — M25511 Pain in right shoulder: Secondary | ICD-10-CM

## 2022-08-01 DIAGNOSIS — M25411 Effusion, right shoulder: Secondary | ICD-10-CM

## 2022-08-01 DIAGNOSIS — M62838 Other muscle spasm: Secondary | ICD-10-CM | POA: Insufficient documentation

## 2022-08-01 DIAGNOSIS — M542 Cervicalgia: Secondary | ICD-10-CM | POA: Diagnosis present

## 2022-08-01 MED ORDER — PREDNISONE 10 MG PO TABS: 10.0000 mg | ORAL_TABLET | Freq: Every day | ORAL | 0 refills | Status: DC
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
## 2022-08-01 NOTE — Progress Notes (Signed)
BP 136/70   Pulse 77   Temp 98.1 F (36.7 C) (Oral)   Wt 154 lb (69.9 kg)   SpO2 96%   BMI 28.16 kg/m    Subjective:    Patient ID: Virginia West, female    DOB: 1946-05-15, 77 y.o.   MRN: [id number]  HPI: Virginia West is a 77 y.o. female  Chief Complaint  Patient presents with   Neck Pain    Pt states she is still having some pain in her neck , states it has now started to move into her R shoulder    NECK PAIN FOLLOW UP Status: ongoing neck pain.  She states it is some what better.  But now that pain has moved into her right shoulder.  States she saw a bulge/swelling on her right shoulder.  She put heat on the area.  She is going to see physical therapy this afternoon. She will see them twice weekly for 8 weeks. Treatments attempted:  steroids, tylneol and voltaren   Compliant with recommended treatment: yes Relief with NSAIDs?:  No NSAIDs Taken Location: right shoulder Duration:weeks Severity: 6/10 Constant pain Radiation: none Aggravating factors: movement Alleviating factors:  prednisone and voltaren, rest, and heat Weakness:  no Paresthesias / decreased sensation:  no  Fevers:  no  Relevant past medical, surgical, family and social history reviewed and updated as indicated. Interim medical history since our last visit reviewed. Allergies and medications reviewed and updated.  Review of Systems  Musculoskeletal:  Positive for neck pain.       Right shoulder pain    Per HPI unless specifically indicated above     Objective:    BP 136/70   Pulse 77   Temp 98.1 F (36.7 C) (Oral)   Wt 154 lb (69.9 kg)   SpO2 96%   BMI 28.16 kg/m   Wt Readings from Last 3 Encounters:  08/01/22 154 lb (69.9 kg)  07/14/22 154 lb 14.4 oz (70.3 kg)  06/30/22 153 lb 9.6 oz (69.7 kg)    Physical Exam Vitals and nursing note reviewed.  Constitutional:      General: She is not in acute distress.    Appearance: Normal appearance. She is normal weight. She is not  ill-appearing, toxic-appearing or diaphoretic.  HENT:     Head: Normocephalic.     Right Ear: External ear normal.     Left Ear: External ear normal.     Nose: Nose normal.     Mouth/Throat:     Mouth: Mucous membranes are moist.     Pharynx: Oropharynx is clear.  Eyes:     General:        Right eye: No discharge.        Left eye: No discharge.     Extraocular Movements: Extraocular movements intact.     Conjunctiva/sclera: Conjunctivae normal.     Pupils: Pupils are equal, round, and reactive to light.  Cardiovascular:     Rate and Rhythm: Normal rate and regular rhythm.     Heart sounds: No murmur heard. Pulmonary:     Effort: Pulmonary effort is normal. No respiratory distress.     Breath sounds: Normal breath sounds. No wheezing or rales.  Musculoskeletal:     Right shoulder: Swelling and tenderness present. Decreased range of motion.     Cervical back: Neck supple. No edema, rigidity or crepitus. Pain with movement and muscular tenderness present. Decreased range of motion.  Skin:    General: Skin is warm  and dry.     Capillary Refill: Capillary refill takes less than 2 seconds.  Neurological:     General: No focal deficit present.     Mental Status: She is alert and oriented to person, place, and time. Mental status is at baseline.  Psychiatric:        Mood and Affect: Mood normal.        Behavior: Behavior normal.        Thought Content: Thought content normal.        Judgment: Judgment normal.     Results for orders placed or performed in visit on 06/30/22  Lipid Profile  Result Value Ref Range   Cholesterol, Total 176 100 - 199 mg/dL   Triglycerides 109 0 - 149 mg/dL   HDL 50 >39 mg/dL   VLDL Cholesterol Cal 20 5 - 40 mg/dL   LDL Chol Calc (NIH) 106 (H) 0 - 99 mg/dL   Chol/HDL Ratio 3.5 0.0 - 4.4 ratio  Comp Met (CMET)  Result Value Ref Range   Glucose 77 70 - 99 mg/dL   BUN 29 (H) 8 - 27 mg/dL   Creatinine, Ser 1.52 (H) 0.57 - 1.00 mg/dL   eGFR 35 (L)  >59 mL/min/1.73   BUN/Creatinine Ratio 19 12 - 28   Sodium 136 134 - 144 mmol/L   Potassium 5.2 3.5 - 5.2 mmol/L   Chloride 100 96 - 106 mmol/L   CO2 20 20 - 29 mmol/L   Calcium 9.6 8.7 - 10.3 mg/dL   Total Protein 7.5 6.0 - 8.5 g/dL   Albumin 4.3 3.8 - 4.8 g/dL   Globulin, Total 3.2 1.5 - 4.5 g/dL   Albumin/Globulin Ratio 1.3 1.2 - 2.2   Bilirubin Total 0.2 0.0 - 1.2 mg/dL   Alkaline Phosphatase 86 44 - 121 IU/L   AST 17 0 - 40 IU/L   ALT 13 0 - 32 IU/L  CBC w/Diff  Result Value Ref Range   WBC 10.3 3.4 - 10.8 x10E3/uL   RBC 4.53 3.77 - 5.28 x10E6/uL   Hemoglobin 13.5 11.1 - 15.9 g/dL   Hematocrit 40.0 34.0 - 46.6 %   MCV 88 79 - 97 fL   MCH 29.8 26.6 - 33.0 pg   MCHC 33.8 31.5 - 35.7 g/dL   RDW 12.7 11.7 - 15.4 %   Platelets 336 150 - 450 x10E3/uL   Neutrophils 68 Not Estab. %   Lymphs 18 Not Estab. %   Monocytes 8 Not Estab. %   Eos 5 Not Estab. %   Basos 1 Not Estab. %   Neutrophils Absolute 7.1 (H) 1.4 - 7.0 x10E3/uL   Lymphocytes Absolute 1.8 0.7 - 3.1 x10E3/uL   Monocytes Absolute 0.8 0.1 - 0.9 x10E3/uL   EOS (ABSOLUTE) 0.5 (H) 0.0 - 0.4 x10E3/uL   Basophils Absolute 0.1 0.0 - 0.2 x10E3/uL   Immature Granulocytes 0 Not Estab. %   Immature Grans (Abs) 0.0 0.0 - 0.1 x10E3/uL  B12  Result Value Ref Range   Vitamin B-12 498 232 - 1,245 pg/mL  HgB A1c  Result Value Ref Range   Hgb A1c MFr Bld 5.6 4.8 - 5.6 %   Est. average glucose Bld gHb Est-mCnc 114 mg/dL      Assessment & Plan:   Problem List Items Addressed This Visit   None Visit Diagnoses     Pain and swelling of right shoulder    -  Primary   Will repeat dose of prednisone.  Xray  of shoulder ordered. Keep appointment with PT for evaluation and treatment. Will make recommendations based on imaging.   Relevant Orders   DG Shoulder Right        Follow up plan: Return in about 1 month (around 09/01/2022) for neck and shoulder pain.

## 2022-08-01 NOTE — Progress Notes (Signed)
Please let patient know that her shoulder shows the tendon in her shoulder has hardened which is likely causing her acute pain.  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  Continue with the prednisone and physical therapy.  If symptoms are not improved at our next visit I will recommend see Ortho.

## 2022-08-03 ENCOUNTER — Encounter: Payer: Self-pay | Admitting: Physical Therapy

## 2022-08-03 ENCOUNTER — Ambulatory Visit: Payer: Federal, State, Local not specified - PPO | Admitting: Physical Therapy

## 2022-08-03 DIAGNOSIS — M542 Cervicalgia: Secondary | ICD-10-CM | POA: Diagnosis not present

## 2022-08-03 DIAGNOSIS — M62838 Other muscle spasm: Secondary | ICD-10-CM
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
## 2022-08-04 ENCOUNTER — Ambulatory Visit
Admission: RE | Admit: 2022-08-04 | Discharge: 2022-08-04 | Disposition: A | Discharge status: Home / Self Care | Payer: Federal, State, Local not specified - PPO | Source: Ambulatory Visit | Attending: Nephrology | Admitting: Nephrology

## 2022-08-04 DIAGNOSIS — N1832 Chronic kidney disease, stage 3b: Secondary | ICD-10-CM | POA: Insufficient documentation

## 2022-08-08 ENCOUNTER — Ambulatory Visit: Payer: Federal, State, Local not specified - PPO | Admitting: Physical Therapy

## 2022-08-08 ENCOUNTER — Encounter: Payer: Self-pay | Admitting: Physical Therapy

## 2022-08-08 DIAGNOSIS — M542 Cervicalgia: Secondary | ICD-10-CM | POA: Diagnosis not present

## 2022-08-08 DIAGNOSIS — M62838 Other muscle spasm: Secondary | ICD-10-CM
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
## 2022-08-10 ENCOUNTER — Encounter: Payer: Federal, State, Local not specified - PPO | Admitting: Physical Therapy

## 2022-08-15 ENCOUNTER — Ambulatory Visit: Payer: Federal, State, Local not specified - PPO | Admitting: Physical Therapy

## 2022-08-15 ENCOUNTER — Encounter: Payer: Self-pay | Admitting: Physical Therapy

## 2022-08-15 DIAGNOSIS — M542 Cervicalgia: Secondary | ICD-10-CM | POA: Diagnosis not present

## 2022-08-15 DIAGNOSIS — M62838 Other muscle spasm: Secondary | ICD-10-CM
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
## 2022-08-17 ENCOUNTER — Encounter: Payer: Federal, State, Local not specified - PPO | Admitting: Physical Therapy

## 2022-08-17 ENCOUNTER — Other Ambulatory Visit: Payer: Self-pay | Admitting: Nephrology

## 2022-08-17 DIAGNOSIS — N1832 Chronic kidney disease, stage 3b: Secondary | ICD-10-CM

## 2022-08-17 DIAGNOSIS — N281 Cyst of kidney, acquired: Secondary | ICD-10-CM

## 2022-08-22 ENCOUNTER — Ambulatory Visit: Payer: Federal, State, Local not specified - PPO | Admitting: Physical Therapy

## 2022-08-22 DIAGNOSIS — M542 Cervicalgia: Secondary | ICD-10-CM

## 2022-08-22 DIAGNOSIS — M62838 Other muscle spasm: Secondary | ICD-10-CM
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
## 2022-08-24 ENCOUNTER — Encounter: Payer: Self-pay | Admitting: Physical Therapy

## 2022-08-24 ENCOUNTER — Encounter: Payer: Federal, State, Local not specified - PPO | Admitting: Physical Therapy

## 2022-08-26 ENCOUNTER — Ambulatory Visit
Admission: RE | Admit: 2022-08-26 | Discharge: 2022-08-26 | Disposition: A | Discharge status: Home / Self Care | Payer: Federal, State, Local not specified - PPO | Source: Ambulatory Visit | Attending: Nephrology | Admitting: Nephrology

## 2022-08-26 DIAGNOSIS — N1832 Chronic kidney disease, stage 3b: Secondary | ICD-10-CM | POA: Insufficient documentation

## 2022-08-26 DIAGNOSIS — N281 Cyst of kidney, acquired: Secondary | ICD-10-CM | POA: Diagnosis present

## 2022-08-29 ENCOUNTER — Encounter: Payer: Federal, State, Local not specified - PPO | Admitting: Physical Therapy

## 2022-08-31 ENCOUNTER — Encounter: Payer: Federal, State, Local not specified - PPO | Admitting: Physical Therapy

## 2022-09-01 NOTE — Progress Notes (Signed)
BP (!) 142/74   Pulse 60   Temp 98 F (36.7 C) (Oral)   Wt 152 lb (68.9 kg)   SpO2 97%   BMI 27.79 kg/m    Subjective:    Patient ID: Virginia West, female    DOB: [birth year], 77 y.o.   MRN: [id number]  HPI: Virginia West is a 77 y.o. female  Chief Complaint  Patient presents with   Pain    Pt states she feels her pain in her neck and shoulder are a little bit better but not much. States she finished the Prednisone and the Methocarbamol, helped with she was on them. States she has also been doing PT which doesn't help much either.    NECK PAIN FOLLOW UP Status: ongoing neck pain.  She states it is some what better.  Has been seeing physical therapy and her left sided neck ROM is improved but the right side is still very difficult.  Feels like her head weighs 50 lbs sitting on her shoulders. She has 2 more months of physical therapy.   Treatments attempted:  steroids, tylneol and voltaren   Compliant with recommended treatment: yes Relief with NSAIDs?:  No NSAIDs Taken Location: right shoulder Duration:weeks Severity: 6/10 Constant pain Radiation: none Aggravating factors: movement Alleviating factors:  prednisone and voltaren, rest, and heat Weakness:  no Paresthesias / decreased sensation:  no  Fevers:  no  Relevant past medical, surgical, family and social history reviewed and updated as indicated. Interim medical history since our last visit reviewed. Allergies and medications reviewed and updated.  Review of Systems  Musculoskeletal:  Positive for neck pain.       Right shoulder pain    Per HPI unless specifically indicated above     Objective:    BP (!) 142/74   Pulse 60   Temp 98 F (36.7 C) (Oral)   Wt 152 lb (68.9 kg)   SpO2 97%   BMI 27.79 kg/m   Wt Readings from Last 3 Encounters:  09/02/22 152 lb (68.9 kg)  08/01/22 154 lb (69.9 kg)  07/14/22 154 lb 14.4 oz (70.3 kg)    Physical Exam Vitals and nursing note reviewed.  Constitutional:       General: She is not in acute distress.    Appearance: Normal appearance. She is normal weight. She is not ill-appearing, toxic-appearing or diaphoretic.  HENT:     Head: Normocephalic.     Right Ear: External ear normal.     Left Ear: External ear normal.     Nose: Nose normal.     Mouth/Throat:     Mouth: Mucous membranes are moist.     Pharynx: Oropharynx is clear.  Eyes:     General:        Right eye: No discharge.        Left eye: No discharge.     Extraocular Movements: Extraocular movements intact.     Conjunctiva/sclera: Conjunctivae normal.     Pupils: Pupils are equal, round, and reactive to light.  Cardiovascular:     Rate and Rhythm: Normal rate and regular rhythm.     Heart sounds: No murmur heard. Pulmonary:     Effort: Pulmonary effort is normal. No respiratory distress.     Breath sounds: Normal breath sounds. No wheezing or rales.  Musculoskeletal:     Right shoulder: Swelling and tenderness present. Decreased range of motion.     Cervical back: Neck supple. No edema, rigidity or crepitus. Pain with  movement and muscular tenderness present. Decreased range of motion.  Skin:    General: Skin is warm and dry.     Capillary Refill: Capillary refill takes less than 2 seconds.  Neurological:     General: No focal deficit present.     Mental Status: She is alert and oriented to person, place, and time. Mental status is at baseline.  Psychiatric:        Mood and Affect: Mood normal.        Behavior: Behavior normal.        Thought Content: Thought content normal.        Judgment: Judgment normal.     Results for orders placed or performed in visit on 06/30/22  Lipid Profile  Result Value Ref Range   Cholesterol, Total 176 100 - 199 mg/dL   Triglycerides 191109 0 - 149 mg/dL   HDL 50 >47>39 mg/dL   VLDL Cholesterol Cal 20 5 - 40 mg/dL   LDL Chol Calc (NIH) 829106 (H) 0 - 99 mg/dL   Chol/HDL Ratio 3.5 0.0 - 4.4 ratio  Comp Met (CMET)  Result Value Ref Range   Glucose  77 70 - 99 mg/dL   BUN 29 (H) 8 - 27 mg/dL   Creatinine, Ser 5.621.52 (H) 0.57 - 1.00 mg/dL   eGFR 35 (L) >13>59 YQ/MVH/8.46mL/min/1.73   BUN/Creatinine Ratio 19 12 - 28   Sodium 136 134 - 144 mmol/L   Potassium 5.2 3.5 - 5.2 mmol/L   Chloride 100 96 - 106 mmol/L   CO2 20 20 - 29 mmol/L   Calcium 9.6 8.7 - 10.3 mg/dL   Total Protein 7.5 6.0 - 8.5 g/dL   Albumin 4.3 3.8 - 4.8 g/dL   Globulin, Total 3.2 1.5 - 4.5 g/dL   Albumin/Globulin Ratio 1.3 1.2 - 2.2   Bilirubin Total 0.2 0.0 - 1.2 mg/dL   Alkaline Phosphatase 86 44 - 121 IU/L   AST 17 0 - 40 IU/L   ALT 13 0 - 32 IU/L  CBC w/Diff  Result Value Ref Range   WBC 10.3 3.4 - 10.8 x10E3/uL   RBC 4.53 3.77 - 5.28 x10E6/uL   Hemoglobin 13.5 11.1 - 15.9 g/dL   Hematocrit 96.240.0 95.234.0 - 46.6 %   MCV 88 79 - 97 fL   MCH 29.8 26.6 - 33.0 pg   MCHC 33.8 31.5 - 35.7 g/dL   RDW 84.112.7 32.411.7 - 40.115.4 %   Platelets 336 150 - 450 x10E3/uL   Neutrophils 68 Not Estab. %   Lymphs 18 Not Estab. %   Monocytes 8 Not Estab. %   Eos 5 Not Estab. %   Basos 1 Not Estab. %   Neutrophils Absolute 7.1 (H) 1.4 - 7.0 x10E3/uL   Lymphocytes Absolute 1.8 0.7 - 3.1 x10E3/uL   Monocytes Absolute 0.8 0.1 - 0.9 x10E3/uL   EOS (ABSOLUTE) 0.5 (H) 0.0 - 0.4 x10E3/uL   Basophils Absolute 0.1 0.0 - 0.2 x10E3/uL   Immature Granulocytes 0 Not Estab. %   Immature Grans (Abs) 0.0 0.0 - 0.1 x10E3/uL  B12  Result Value Ref Range   Vitamin B-12 498 232 - 1,245 pg/mL  HgB A1c  Result Value Ref Range   Hgb A1c MFr Bld 5.6 4.8 - 5.6 %   Est. average glucose Bld gHb Est-mCnc 114 mg/dL      Assessment & Plan:   Problem List Items Addressed This Visit   None Visit Diagnoses     Torticollis, acute    -  Primary   Ongoing. Will obtain xray of neck and refer patient to Ortho.  Continue with Tylenol and Methocarbamol. Can also use Voltaren Gel.   Relevant Orders   DG Cervical Spine Complete   Ambulatory referral to Orthopedic Surgery        Follow up plan: Return in about 1 month  (around 10/02/2022) for HTN, HLD, DM2 FU.

## 2022-09-02 ENCOUNTER — Encounter: Payer: Self-pay | Admitting: Nurse Practitioner

## 2022-09-02 ENCOUNTER — Ambulatory Visit: Payer: Federal, State, Local not specified - PPO | Admitting: Nurse Practitioner

## 2022-09-02 VITALS — BP 142/74 | HR 60 | Temp 98.0°F | Wt 152.0 lb

## 2022-09-02 DIAGNOSIS — M436 Torticollis: Secondary | ICD-10-CM | POA: Diagnosis not present

## 2022-09-02 MED ORDER — METHOCARBAMOL 500 MG PO TABS: 500.0000 mg | ORAL_TABLET | Freq: Three times a day (TID) | ORAL | 0 refills | Status: DC | PRN

## 2022-09-05 ENCOUNTER — Ambulatory Visit
Admission: RE | Admit: 2022-09-05 | Discharge: 2022-09-05 | Disposition: A | Discharge status: Home / Self Care | Payer: Federal, State, Local not specified - PPO | Attending: Nurse Practitioner | Admitting: Nurse Practitioner

## 2022-09-05 ENCOUNTER — Ambulatory Visit
Admission: RE | Admit: 2022-09-05 | Discharge: 2022-09-05 | Disposition: A | Discharge status: Home / Self Care | Payer: Federal, State, Local not specified - PPO | Source: Ambulatory Visit | Attending: Nurse Practitioner | Admitting: Nurse Practitioner

## 2022-09-05 ENCOUNTER — Encounter: Payer: Self-pay | Admitting: Physical Therapy

## 2022-09-05 ENCOUNTER — Ambulatory Visit: Payer: Federal, State, Local not specified - PPO | Attending: Nurse Practitioner | Admitting: Physical Therapy

## 2022-09-05 DIAGNOSIS — M62838 Other muscle spasm: Secondary | ICD-10-CM | POA: Diagnosis present

## 2022-09-05 DIAGNOSIS — M542 Cervicalgia: Secondary | ICD-10-CM | POA: Insufficient documentation

## 2022-09-05 DIAGNOSIS — M436 Torticollis: Secondary | ICD-10-CM | POA: Diagnosis present
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
## 2022-09-07 ENCOUNTER — Encounter: Payer: Federal, State, Local not specified - PPO | Admitting: Physical Therapy

## 2022-09-07 NOTE — Progress Notes (Signed)
Hi Virginia West.  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  No other abnormalities were found.  I still recommend seeing Orthopedics as we discussed during our last visit.

## 2022-09-19 ENCOUNTER — Ambulatory Visit: Payer: Federal, State, Local not specified - PPO | Admitting: Physical Therapy

## 2022-09-19 DIAGNOSIS — M542 Cervicalgia: Secondary | ICD-10-CM | POA: Diagnosis not present

## 2022-09-19 DIAGNOSIS — M62838 Other muscle spasm: Secondary | ICD-10-CM
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
## 2022-09-30 ENCOUNTER — Ambulatory Visit: Payer: Federal, State, Local not specified - PPO | Admitting: Nurse Practitioner

## 2022-09-30 ENCOUNTER — Encounter: Payer: Self-pay | Admitting: Physician Assistant

## 2022-09-30 ENCOUNTER — Ambulatory Visit: Payer: Federal, State, Local not specified - PPO | Admitting: Physician Assistant

## 2022-09-30 VITALS — BP 124/71 | HR 63 | Temp 97.9°F | Wt 150.8 lb

## 2022-09-30 DIAGNOSIS — E538 Deficiency of other specified B group vitamins: Secondary | ICD-10-CM | POA: Diagnosis not present

## 2022-09-30 DIAGNOSIS — F330 Major depressive disorder, recurrent, mild: Secondary | ICD-10-CM

## 2022-09-30 DIAGNOSIS — E559 Vitamin D deficiency, unspecified: Secondary | ICD-10-CM | POA: Diagnosis not present

## 2022-09-30 DIAGNOSIS — N1832 Chronic kidney disease, stage 3b: Secondary | ICD-10-CM

## 2022-09-30 DIAGNOSIS — I83893 Varicose veins of bilateral lower extremities with other complications: Secondary | ICD-10-CM | POA: Insufficient documentation

## 2022-09-30 DIAGNOSIS — N3941 Urge incontinence: Secondary | ICD-10-CM

## 2022-09-30 NOTE — Assessment & Plan Note (Signed)
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

## 2022-09-30 NOTE — Progress Notes (Deleted)
There were no vitals taken for this visit.   Subjective:    Patient ID: Virginia West, female    DOB: 07-05-1945, 76 y.o.   MRN: [id number]  HPI: Virginia West is a 77 y.o. female  No chief complaint on file.  CHRONIC KIDNEY DISEASE CKD status: {Blank single:19197::"controlled","uncontrolled","better","worse","exacerbated","stable"} Medications renally dose: {Blank single:19197::"yes","no"} Previous renal evaluation: {Blank single:19197::"yes","no"} Pneumovax:  {Blank single:19197::"Up to Date","Not up to Date","unknown"} Influenza Vaccine:  {Blank single:19197::"Up to Date","Not up to Date","unknown"}  HYPERLIPIDEMIA Hyperlipidemia status: {Blank single:19197::"excellent compliance","good compliance","fair compliance","poor compliance"} Satisfied with current treatment?  {Blank single:19197::"yes","no"} Side effects:  {Blank single:19197::"yes","no"} Medication compliance: {Blank single:19197::"excellent compliance","good compliance","fair compliance","poor compliance"} Past cholesterol meds: {Blank multiple:19196::"none","atorvastain (lipitor)","lovastatin (mevacor)","pravastatin (pravachol)","rosuvastatin (crestor)","simvastatin (zocor)","vytorin","fenofibrate (tricor)","gemfibrozil","ezetimide (zetia)","niaspan","lovaza"} Supplements: {Blank multiple:19196::"none","fish oil","niacin","red yeast rice"} Aspirin:  {Blank single:19197::"yes","no"} The 10-year ASCVD risk score (Arnett DK, et al., 2019) is: 21.1%   Values used to calculate the score:     Age: 9 years     Sex: Female     Is Non-Hispanic African American: No     Diabetic: No     Tobacco smoker: No     Systolic Blood Pressure: 142 mmHg     Is BP treated: No     HDL Cholesterol: 50 mg/dL     Total Cholesterol: 176 mg/dL Chest pain:  {Blank [id number]:91478::"GNF","AO"} Coronary artery disease:  {Blank single:19197::"yes","no"} Family history CAD:  {Blank single:19197::"yes","no"} Family history early CAD:  {Blank  single:19197::"yes","no"}  Relevant past medical, surgical, family and social history reviewed and updated as indicated. Interim medical history since our last visit reviewed. Allergies and medications reviewed and updated.  Review of Systems  Per HPI unless specifically indicated above     Objective:    There were no vitals taken for this visit.  Wt Readings from Last 3 Encounters:  09/02/22 152 lb (68.9 kg)  08/01/22 154 lb (69.9 kg)  07/14/22 154 lb 14.4 oz (70.3 kg)    Physical Exam  Results for orders placed or performed in visit on 06/30/22  Lipid Profile  Result Value Ref Range   Cholesterol, Total 176 100 - 199 mg/dL   Triglycerides 130 0 - 149 mg/dL   HDL 50 >86 mg/dL   VLDL Cholesterol Cal 20 5 - 40 mg/dL   LDL Chol Calc (NIH) 578 (H) 0 - 99 mg/dL   Chol/HDL Ratio 3.5 0.0 - 4.4 ratio  Comp Met (CMET)  Result Value Ref Range   Glucose 77 70 - 99 mg/dL   BUN 29 (H) 8 - 27 mg/dL   Creatinine, Ser 4.69 (H) 0.57 - 1.00 mg/dL   eGFR 35 (L) >62 XB/MWU/1.32   BUN/Creatinine Ratio 19 12 - 28   Sodium 136 134 - 144 mmol/L   Potassium 5.2 3.5 - 5.2 mmol/L   Chloride 100 96 - 106 mmol/L   CO2 20 20 - 29 mmol/L   Calcium 9.6 8.7 - 10.3 mg/dL   Total Protein 7.5 6.0 - 8.5 g/dL   Albumin 4.3 3.8 - 4.8 g/dL   Globulin, Total 3.2 1.5 - 4.5 g/dL   Albumin/Globulin Ratio 1.3 1.2 - 2.2   Bilirubin Total 0.2 0.0 - 1.2 mg/dL   Alkaline Phosphatase 86 44 - 121 IU/L   AST 17 0 - 40 IU/L   ALT 13 0 - 32 IU/L  CBC w/Diff  Result Value Ref Range   WBC 10.3 3.4 - 10.8 x10E3/uL   RBC 4.53 3.77 - 5.28 x10E6/uL   Hemoglobin 13.5 11.1 - 15.9 g/dL  Hematocrit 40.0 34.0 - 46.6 %   MCV 88 79 - 97 fL   MCH 29.8 26.6 - 33.0 pg   MCHC 33.8 31.5 - 35.7 g/dL   RDW 16.1 09.6 - 04.5 %   Platelets 336 150 - 450 x10E3/uL   Neutrophils 68 Not Estab. %   Lymphs 18 Not Estab. %   Monocytes 8 Not Estab. %   Eos 5 Not Estab. %   Basos 1 Not Estab. %   Neutrophils Absolute 7.1 (H) 1.4 - 7.0  x10E3/uL   Lymphocytes Absolute 1.8 0.7 - 3.1 x10E3/uL   Monocytes Absolute 0.8 0.1 - 0.9 x10E3/uL   EOS (ABSOLUTE) 0.5 (H) 0.0 - 0.4 x10E3/uL   Basophils Absolute 0.1 0.0 - 0.2 x10E3/uL   Immature Granulocytes 0 Not Estab. %   Immature Grans (Abs) 0.0 0.0 - 0.1 x10E3/uL  B12  Result Value Ref Range   Vitamin B-12 498 232 - 1,245 pg/mL  HgB A1c  Result Value Ref Range   Hgb A1c MFr Bld 5.6 4.8 - 5.6 %   Est. average glucose Bld gHb Est-mCnc 114 mg/dL      Assessment & Plan:   Problem List Items Addressed This Visit       Genitourinary   CKD (chronic kidney disease), stage III (HCC) - Primary     Other   Vitamin D deficiency   B12 deficiency   Caregiver stress   Severe episode of recurrent major depressive disorder, without psychotic features (HCC)   Hyperlipidemia     Follow up plan: No follow-ups on file.

## 2022-09-30 NOTE — Progress Notes (Signed)
Established Patient Office Visit  Name: Virginia West   MRN: [id number]    DOB: 1945/08/27   Date:09/30/2022  Today's Provider: Jacquelin Hawking, MHS, PA-C Introduced myself to the patient as a PA-C and provided education on APPs in clinical practice.         Subjective  Chief Complaint  Chief Complaint  Patient presents with   Depression   Chronic Kidney Disease    HPI   CKD  Most recent eGFR of 35 in Feb  She is established with Dr. 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 She reports she is following up with Nephrology to discuss her imaging   Vitamin D and B12 Recheck today Unsure if she is taking supplements   DEPRESSION Mood status: stable Yesterday she had a scare with her husband in which he eloped and was lost in [place] due to dementia  She reports she has had to remove keys and ability to drive at this point  Satisfied with current treatment?: yes Symptom severity: mild  Duration of current treatment : chronic Side effects: no Medication compliance: good compliance Psychotherapy/counseling: no in the past she has not been referred to a new therapist  She is seeing Dr. Mariam Dollar for psychiatry and med management  Previous psychiatric medications: wellbutrin and remeron  Depressed mood: no Anxious mood: yes Anhedonia: no Significant weight loss or gain: no Insomnia: no hard to fall asleep due to neck pain  Fatigue: yes Feelings of worthlessness or guilt: no Impaired concentration/indecisiveness: no Suicidal ideations: no Hopelessness: no Crying spells: no    09/30/2022   11:01 AM 09/02/2022   10:10 AM 08/01/2022   11:06 AM 06/30/2022   10:38 AM 06/29/2022   11:24 AM  Depression screen PHQ 2/9  Decreased Interest 1 2 1  0   Down, Depressed, Hopeless 1 2 1  0   PHQ - 2 Score 2 4 2  0   Altered sleeping 2 2 3 3    Tired, decreased energy 2 3 3 2     Change in appetite 0 0 0 0   Feeling bad or failure about yourself  1 1 1 1    Trouble concentrating 0 0 0 0   Moving slowly or fidgety/restless 0 0 0 0   Suicidal thoughts 0 0 0 0   PHQ-9 Score 7 10 9 6    Difficult doing work/chores Somewhat difficult Somewhat difficult Somewhat difficult Somewhat difficult      Information is confidential and restricted. Go to Review Flowsheets to unlock data.    Neck Pain  She reports her neck pain is not resolving She states she has an apt with [facility] for evaluation and to discuss potential dry needling She has been taking muscle relaxers and Tylenol for management She reports steroids were very beneficial but pain returned as soon as she stopped taking She reports her head feels heavy and turning her head to the right is very painful  She reports pain radiating into the neck along the right side   She reports new onset of intense leg cramping at night Started about 5-6 weeks ago  She reports she is staying well hydrated and trying to monitor her electrolytes and dietary intake to prevent lows  She reports most recent labs from Dr. Cherylann Ratel showed increased potassium      Patient Active Problem List   Diagnosis  Date Noted   Varicose veins of bilateral lower extremities with other complications 09/30/2022   CKD (chronic kidney disease), stage III (HCC) 07/14/2022   MDD (major depressive disorder), recurrent, in partial remission (HCC) 06/29/2022   Decreased GFR 12/28/2021   Hyperlipidemia 12/28/2021   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   Hip pain 11/02/2021   Pain of right hip 10/27/2021   Rash 10/27/2021   Bereavement 07/16/2021   Severe episode of recurrent major depressive disorder, without psychotic features (HCC) 07/16/2021   Depression, recurrent (HCC) 05/25/2021   Current mild episode of major depressive disorder (HCC) 04/27/2021   Caregiver stress 04/27/2021   Colon cancer screening    Urge incontinence of urine 03/16/2021    Elevated serum creatinine 03/16/2021   PTSD (post-traumatic stress disorder) 02/23/2021   Vitamin D deficiency 02/23/2021   Neuropathy 02/23/2021   B12 deficiency 02/23/2021   Screen for colon cancer 02/23/2021   Screening for osteoporosis 02/23/2021   Weight loss 02/23/2021   Encounter for screening for malignant neoplasm of breast 02/23/2021   Need for influenza vaccination 02/23/2021   Glaucoma 02/23/2021   Screening for cervical cancer 02/23/2021    Past Surgical History:  Procedure Laterality Date   ABDOMINAL HYSTERECTOMY     APPENDECTOMY     CHOLECYSTECTOMY     COLONOSCOPY WITH PROPOFOL N/A 03/24/2021   Procedure: COLONOSCOPY WITH PROPOFOL;  Surgeon: Toney Reil, MD;  Location: [facility] ENDOSCOPY;  Service: Gastroenterology;  Laterality: N/A;    Family History  Problem Relation Age of Onset   Heart disease Mother    Heart disease Father    Cancer Brother    Breast cancer Maternal Aunt 69   Cancer Son    Depression Son     Social History   Tobacco Use   Smoking status: Never    Passive exposure: Past   Smokeless tobacco: Never  Substance Use Topics   Alcohol use: Not Currently     Current Outpatient Medications:    buPROPion (WELLBUTRIN) 75 MG tablet, Take 1 tablet (75 mg total) by mouth daily with breakfast., Disp: 90 tablet, Rfl: 1   clobetasol ointment (TEMOVATE) 0.05 %, Apply topically 2 (two) times a week., Disp: 60 g, Rfl: 3   clonazePAM (KLONOPIN) 0.5 MG tablet, Take 0.5-1 tablets (0.25-0.5 mg total) by mouth daily as needed for anxiety. Take as needed for severe anxiety, grief- please limit use, Disp: 21 tablet, Rfl: 0   latanoprost (XALATAN) 0.005 % ophthalmic solution, SMARTSIG:1 In Eye(s) Every Night, Disp: , Rfl:    methocarbamol (ROBAXIN) 500 MG tablet, Take 1 tablet (500 mg total) by mouth every 8 (eight) hours as needed for muscle spasms., Disp: 30 tablet, Rfl: 0   mirtazapine (REMERON) 7.5 MG tablet, Take 1 tablet (7.5 mg total) by mouth  at bedtime., Disp: 90 tablet, Rfl: 1   timolol (TIMOPTIC) 0.5 % ophthalmic solution, SMARTSIG:In Eye(s), Disp: , Rfl:    tolterodine (DETROL LA) 2 MG 24 hr capsule, TAKE 1 CAPSULE BY MOUTH DAILY., Disp: 90 capsule, Rfl: 0  No Known Allergies  I personally reviewed active problem list, medication list, health maintenance, notes from last encounter, lab results with the patient/caregiver today.   Review of Systems  Constitutional:  Negative for chills, fever, malaise/fatigue and weight loss.  Eyes:  Negative for blurred vision and double vision.  Respiratory:  Negative for shortness of breath and wheezing.   Cardiovascular:  Negative for chest pain, palpitations and leg swelling.  Musculoskeletal:  Positive for myalgias (muscle  cramping) and neck pain (ongoing).  Neurological:  Positive for dizziness (orthostatic). Negative for tingling, tremors and headaches.  Psychiatric/Behavioral:  Positive for depression. The patient is nervous/anxious and has insomnia.       Objective  Vitals:   09/30/22 1055  BP: 124/71  Pulse: 63  Temp: 97.9 F (36.6 C)  TempSrc: Oral  SpO2: 97%  Weight: 150 lb 12.8 oz (68.4 kg)    Body mass index is 27.57 kg/m.  Physical Exam Vitals reviewed.  Constitutional:      General: She is awake.     Appearance: Normal appearance. She is well-developed and well-groomed.  HENT:     Head: Normocephalic and atraumatic.  Eyes:     General: Lids are normal. Gaze aligned appropriately.     Pupils: Pupils are equal, round, and reactive to light.  Cardiovascular:     Rate and Rhythm: Normal rate and regular rhythm.     Pulses:          Radial pulses are 2+ on the right side and 2+ on the left side.       Dorsalis pedis pulses are 1+ on the right side and 1+ on the left side.     Heart sounds: Normal heart sounds.     Comments: Lower extremity coolness and dry flaky skin noted bilaterally at ankles  Pulmonary:     Effort: Pulmonary effort is normal.      Breath sounds: Normal breath sounds. No decreased air movement. No decreased breath sounds, wheezing, rhonchi or rales.  Musculoskeletal:     Cervical back: Neck supple. Torticollis present. Pain with movement present.     Right lower leg: No edema.     Left lower leg: No edema.  Skin:    General: Skin is dry.  Neurological:     General: No focal deficit present.     Mental Status: She is alert and oriented to person, place, and time. Mental status is at baseline.  Psychiatric:        Mood and Affect: Mood normal.        Behavior: Behavior normal. Behavior is cooperative.        Thought Content: Thought content normal.        Judgment: Judgment normal.      No results found for this or any previous visit (from the past 2160 hour(s)).   PHQ2/9:    09/30/2022   11:01 AM 09/02/2022   10:10 AM 08/01/2022   11:06 AM 06/30/2022   10:38 AM 06/29/2022   11:24 AM  Depression screen PHQ 2/9  Decreased Interest 1 2 1  0   Down, Depressed, Hopeless 1 2 1  0   PHQ - 2 Score 2 4 2  0   Altered sleeping 2 2 3 3    Tired, decreased energy 2 3 3 2    Change in appetite 0 0 0 0   Feeling bad or failure about yourself  1 1 1 1    Trouble concentrating 0 0 0 0   Moving slowly or fidgety/restless 0 0 0 0   Suicidal thoughts 0 0 0 0   PHQ-9 Score 7 10 9 6    Difficult doing work/chores Somewhat difficult Somewhat difficult Somewhat difficult Somewhat difficult      Information is confidential and restricted. Go to Review Flowsheets to unlock data.      Fall Risk:    09/30/2022   11:01 AM 09/02/2022   10:10 AM 08/01/2022   11:05 AM 06/30/2022  10:38 AM 12/28/2021   10:38 AM  Fall Risk   Falls in the past year? 0 0 0 0 0  Number falls in past yr: 0 0 0 0 0  Injury with Fall? 0 0 0 0 0  Risk for fall due to : No Fall Risks No Fall Risks No Fall Risks No Fall Risks No Fall Risks  Follow up Falls evaluation completed Falls evaluation completed Falls evaluation completed Falls evaluation completed Falls  evaluation completed      Functional Status Survey:      Assessment & Plan  Problem List Items Addressed This Visit       Cardiovascular and Mediastinum   Varicose veins of bilateral lower extremities with other complications    Chronic, ongoing Patient reports severe leg cramping and cool lower extremities DP pulses are intact but feet and ankles are cool to the touch with notable varicose veins in feet and ankles. 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 She is following up with Nephrology for monitoring and management Recommend renal dosing for medications and avoiding Nephrotoxic agents Continue collaboration with Nephrology  Recommend follow up in about 3 months for repeat testing or sooner if concerns arise       Relevant Orders   Comp Met (CMET)   CBC w/Diff   TSH     Other   Vitamin D deficiency    Recheck labs Results to dictate further management       Relevant Orders   Vitamin D (25 hydroxy)   B12 deficiency    Recheck labs today  Results to dictate further management        Relevant Orders   B12   Urge incontinence of urine    Chronic, ongoing Patient reports significant urge incontinence symptoms where she is unable to make it to the restroom to relieve herself May need referral to pelvic floor therapy and Urology for evaluation and management Follow up in about 3 months to discuss further.       Current mild episode of  major depressive disorder (HCC)    Chronic, ongoing with fluctuating severity Today PHQ9 is 7 which is improved She is not currently seeing therapy services but is managed by Psychiatry for medications  Continue collaboration  Continue current regimen Follow up as needed for persistent or progressing symptoms  or in 6 months for routine follow up         No follow-ups on file.   I, Adi Doro E Pieper Kasik, PA-C, have reviewed all documentation for this visit. The documentation on 09/30/22 for the exam, diagnosis, procedures, and orders are all accurate and complete.   Jacquelin Hawking, MHS, PA-C [facility]

## 2022-09-30 NOTE — Assessment & Plan Note (Signed)
Recheck labs Results to dictate further management

## 2022-09-30 NOTE — Assessment & Plan Note (Signed)
Chronic, ongoing Patient reports severe leg cramping and cool lower extremities DP pulses are intact but feet and ankles are cool to the touch with notable varicose veins in feet and ankles. Unsure if cramping could be related to peripheral vascular disease vs electrolyte imbalance given her CKD  Recommend close monitoring and may need referral to Vein and Vascular services for evaluation

## 2022-09-30 NOTE — Assessment & Plan Note (Signed)
Chronic, ongoing with fluctuating severity Today PHQ9 is 7 which is improved She is not currently seeing therapy services but is managed by Psychiatry for medications  Continue collaboration  Continue current regimen Follow up as needed for persistent or progressing symptoms  or in 6 months for routine follow up

## 2022-09-30 NOTE — Patient Instructions (Signed)
  I recommend taking Zyrtec 5 mg once per day to help with allergies and itching

## 2022-09-30 NOTE — Assessment & Plan Note (Signed)
Chronic, ongoing Patient reports significant urge incontinence symptoms where she is unable to make it to the restroom to relieve herself May need referral to pelvic floor therapy and Urology for evaluation and management Follow up in about 3 months to discuss further.

## 2022-09-30 NOTE — Assessment & Plan Note (Signed)
Recheck labs today  Results to dictate further management

## 2022-10-01 LAB — COMPREHENSIVE METABOLIC PANEL
ALT: 18 IU/L (ref 0–32)
AST: 25 IU/L (ref 0–40)
Albumin/Globulin Ratio: 1.3 (ref 1.2–2.2)
Albumin: 4.4 g/dL (ref 3.8–4.8)
Alkaline Phosphatase: 100 IU/L (ref 44–121)
BUN/Creatinine Ratio: 20 (ref 12–28)
BUN: 27 mg/dL (ref 8–27)
Bilirubin Total: 0.3 mg/dL (ref 0.0–1.2)
CO2: 18 mmol/L — ABNORMAL LOW (ref 20–29)
Calcium: 10.2 mg/dL (ref 8.7–10.3)
Chloride: 104 mmol/L (ref 96–106)
Creatinine, Ser: 1.34 mg/dL — ABNORMAL HIGH (ref 0.57–1.00)
Globulin, Total: 3.4 g/dL (ref 1.5–4.5)
Glucose: 87 mg/dL (ref 70–99)
Potassium: 4.8 mmol/L (ref 3.5–5.2)
Sodium: 139 mmol/L (ref 134–144)
Total Protein: 7.8 g/dL (ref 6.0–8.5)
eGFR: 41 mL/min/{1.73_m2} — ABNORMAL LOW (ref >59)

## 2022-10-01 LAB — CBC WITH DIFFERENTIAL/PLATELET
Basophils Absolute: 0.1 10*3/uL (ref 0.0–0.2)
Basos: 1 %
EOS (ABSOLUTE): 0.4 10*3/uL (ref 0.0–0.4)
Eos: 4 %
Hematocrit: 40.6 % (ref 34.0–46.6)
Hemoglobin: 13.6 g/dL (ref 11.1–15.9)
Immature Grans (Abs): 0 10*3/uL (ref 0.0–0.1)
Immature Granulocytes: 0 %
Lymphocytes Absolute: 2.1 10*3/uL (ref 0.7–3.1)
Lymphs: 19 %
MCH: 29.6 pg (ref 26.6–33.0)
MCHC: 33.5 g/dL (ref 31.5–35.7)
MCV: 88 fL (ref 79–97)
Monocytes Absolute: 0.8 10*3/uL (ref 0.1–0.9)
Monocytes: 7 %
Neutrophils Absolute: 7.7 10*3/uL — ABNORMAL HIGH (ref 1.4–7.0)
Neutrophils: 69 %
Platelets: 374 10*3/uL (ref 150–450)
RBC: 4.6 x10E6/uL (ref 3.77–5.28)
RDW: 13.4 % (ref 11.7–15.4)
WBC: 11 10*3/uL — ABNORMAL HIGH (ref 3.4–10.8)

## 2022-10-01 LAB — TSH: TSH: 0.782 u[IU]/mL (ref 0.450–4.500)

## 2022-10-01 LAB — VITAMIN B12: Vitamin B-12: 534 pg/mL (ref 232–1245)

## 2022-10-01 LAB — VITAMIN D 25 HYDROXY (VIT D DEFICIENCY, FRACTURES): Vit D, 25-Hydroxy: 21.2 ng/mL — ABNORMAL LOW (ref 30.0–100.0)

## 2022-10-04 NOTE — Progress Notes (Signed)
Your labs have returned.  Your thyroid levels appear normal.  Your vitamin B12 is in the normal range.  Your vitamin D level is low I recommend supplementing at this time to help improve this.  Your blood count showed a mild elevation of your white blood cells which is sometimes an indication of an infection.  I recommend that we keep an eye on this and please let us know if you have any signs of illness.  Your electrolytes are overall normal and stable at this time.  Your kidney function appears a bit improved from our last results from 3 months ago.  Please continue to follow nephrology recommendations for maintaining your kidney health.  Let us know if you have further questions or concerns.

## 2022-10-12 ENCOUNTER — Ambulatory Visit: Payer: Federal, State, Local not specified - PPO | Admitting: Psychiatry

## 2022-10-12 ENCOUNTER — Encounter: Payer: Self-pay | Admitting: Psychiatry

## 2022-10-12 VITALS — BP 162/76 | HR 66 | Temp 97.8°F | Ht 62.0 in | Wt 153.4 lb

## 2022-10-12 DIAGNOSIS — F411 Generalized anxiety disorder: Secondary | ICD-10-CM

## 2022-10-12 DIAGNOSIS — F330 Major depressive disorder, recurrent, mild: Secondary | ICD-10-CM | POA: Diagnosis not present

## 2022-10-12 DIAGNOSIS — F4310 Post-traumatic stress disorder, unspecified: Secondary | ICD-10-CM | POA: Diagnosis not present

## 2022-10-12 DIAGNOSIS — F4381 Prolonged grief disorder: Secondary | ICD-10-CM

## 2022-10-12 MED ORDER — ZOLPIDEM TARTRATE 5 MG PO TABS
5.0000 mg | ORAL_TABLET | Freq: Every evening | ORAL | 0 refills | Status: DC | PRN
Start: 2022-10-12 — End: 2023-03-07
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
## 2022-10-12 NOTE — Patient Instructions (Signed)
Zolpidem Tablets What is this medication? ZOLPIDEM (zole PI dem) treats insomnia. It helps you get to sleep faster and stay asleep throughout the night. It is often used for a short period of time. This medicine may be used for other purposes; ask your health care provider or pharmacist if you have questions. COMMON BRAND NAME(S): Ambien What should I tell my care team before I take this medication? They need to know if you have any of these conditions: Depression History of drug abuse or addiction If you often drink alcohol Liver disease Lung or breathing disease Myasthenia gravis Sleep apnea Sleep-walking, driving, eating or other activity while not fully awake after taking a sleep medication Suicidal thoughts, plans, or attempt; a previous suicide attempt by you or a family member An unusual or allergic reaction to zolpidem, other medications, foods, dyes, or preservatives Pregnant or trying to get pregnant Breast-feeding How should I use this medication? Take this medication by mouth with a glass of water. Follow the directions on the prescription label. It is better to take this medication on an empty stomach and only when you are ready for bed. Do not take your medication more often than directed. If you have been taking this medication for several weeks and suddenly stop taking it, you may get unpleasant withdrawal symptoms. Your care team may want to gradually reduce the dose. Do not stop taking this medication on your own. Always follow your care team's advice. A special MedGuide will be given to you by the pharmacist with each prescription and refill. Be sure to read this information carefully each time. Talk to your care team regarding the use of this medication in children. Special care may be needed. Overdosage: If you think you have taken too much of this medicine contact a poison control center or emergency room at once. NOTE: This medicine is only for you. Do not share this  medicine with others. What if I miss a dose? This does not apply. This medication should only be taken immediately before going to sleep. Do not take double or extra doses. What may interact with this medication? Alcohol Antihistamines for allergy, cough and cold Certain medications for anxiety or sleep Certain medications for depression, like amitriptyline, fluoxetine, sertraline Certain medications for fungal infections like ketoconazole and itraconazole Certain medications for seizures like phenobarbital, primidone Ciprofloxacin Dietary supplements for sleep, like valerian or kava kava General anesthetics like halothane, isoflurane, methoxyflurane, propofol Local anesthetics like lidocaine, pramoxine, tetracaine Medications that relax muscles for surgery Narcotic medications for pain Phenothiazines like chlorpromazine, mesoridazine, prochlorperazine, thioridazine Rifampin This list may not describe all possible interactions. Give your health care provider a list of all the medicines, herbs, non-prescription drugs, or dietary supplements you use. Also tell them if you smoke, drink alcohol, or use illegal drugs. Some items may interact with your medicine. What should I watch for while using this medication? Visit your care team for regular checks on your progress. Keep a regular sleep schedule by going to bed at about the same time each night. Avoid caffeine-containing drinks in the evening hours. When sleep medications are used every night for more than a few weeks, they may stop working. Talk to your care team if you still have trouble sleeping. After taking this medication, you may get up out of bed and do an activity that you do not know you are doing. The next morning, you may have no memory of this. Activities include driving a car ("sleep-driving"), making and eating food,   talking on the phone, sexual activity, and sleep-walking. Serious injuries have occurred. Stop the medication and  call your care team right away if you find out you have done any of these activities. Do not take this medication if you have used alcohol that evening. Do not take it if you have taken another medication for sleep. The risk of doing these sleep-related activities is higher. Wait for at least 8 hours after you take a dose before driving or doing other activities that require full mental alertness. Do not take this medication unless you are able to stay in bed for a full night (7 to 8 hours) before you must be active again. You may have a decrease in mental alertness the day after use, even if you feel that you are fully awake. Tell your care team if you will need to perform activities requiring full alertness, such as driving, the next day. Do not stand or sit up quickly after taking this medication, especially if you are an older patient. This reduces the risk of dizzy or fainting spells. If you or your family notice any changes in your behavior, such as new or worsening depression, thoughts of harming yourself, anxiety, other unusual or disturbing thoughts, or memory loss, call your care team right away. After you stop taking this medication, you may have trouble falling asleep. This is called rebound insomnia. This problem usually goes away on its own after 1 or 2 nights. What side effects may I notice from receiving this medication? Side effects that you should report to your care team as soon as possible: Allergic reactions--skin rash, itching, hives, swelling of the face, lips, tongue, or throat Change in vision such as blurry vision, seeing halos around lights, vision loss CNS depression--slow or shallow breathing, shortness of breath, feeling faint, dizziness, confusion, difficulty staying awake Mood and behavior changes--anxiety, nervousness, confusion, hallucinations, irritability, hostility, thoughts of suicide or self-harm, worsening mood, feelings of depression Unusual sleep behaviors or  activities you do not remember such as driving, eating, or sexual activity Side effects that usually do not require medical attention (report to your care team if they continue or are bothersome): Diarrhea Dizziness Drowsiness the day after use Headache This list may not describe all possible side effects. Call your doctor for medical advice about side effects. You may report side effects to FDA at 1-800-FDA-1088. Where should I keep my medication? Keep out of the reach of children and pets. This medication can be abused. Keep your medication in a safe place to protect it from theft. Do not share this medication with anyone. Selling or giving away this medication is dangerous and against the law. Store at room temperature between 20 and 25 degrees C (68 and 77 degrees F). This medication may cause accidental overdose and death if taken by other adults, children, or pets. Mix any unused medication with a substance like cat litter or coffee grounds. Then throw the medication away in a sealed container like a sealed bag or a coffee can with a lid. Do not use the medication after the expiration date. NOTE: This sheet is a summary. It may not cover all possible information. If you have questions about this medicine, talk to your doctor, pharmacist, or health care provider.  2023 Elsevier/Gold Standard (2020-05-08 00:00:00)  

## 2022-10-30 IMAGING — DX DG HIP (WITH OR WITHOUT PELVIS) 2-3V*R*
4 series · 4 of 4 positions shown · non-contrast
Comparison: None available

CLINICAL DATA: RIGHT hip pain and buttock for 3 weeks.

EXAM:
DG HIP (WITH OR WITHOUT PELVIS) 2-3V RIGHT

[pelvis ap (1 of 2)]
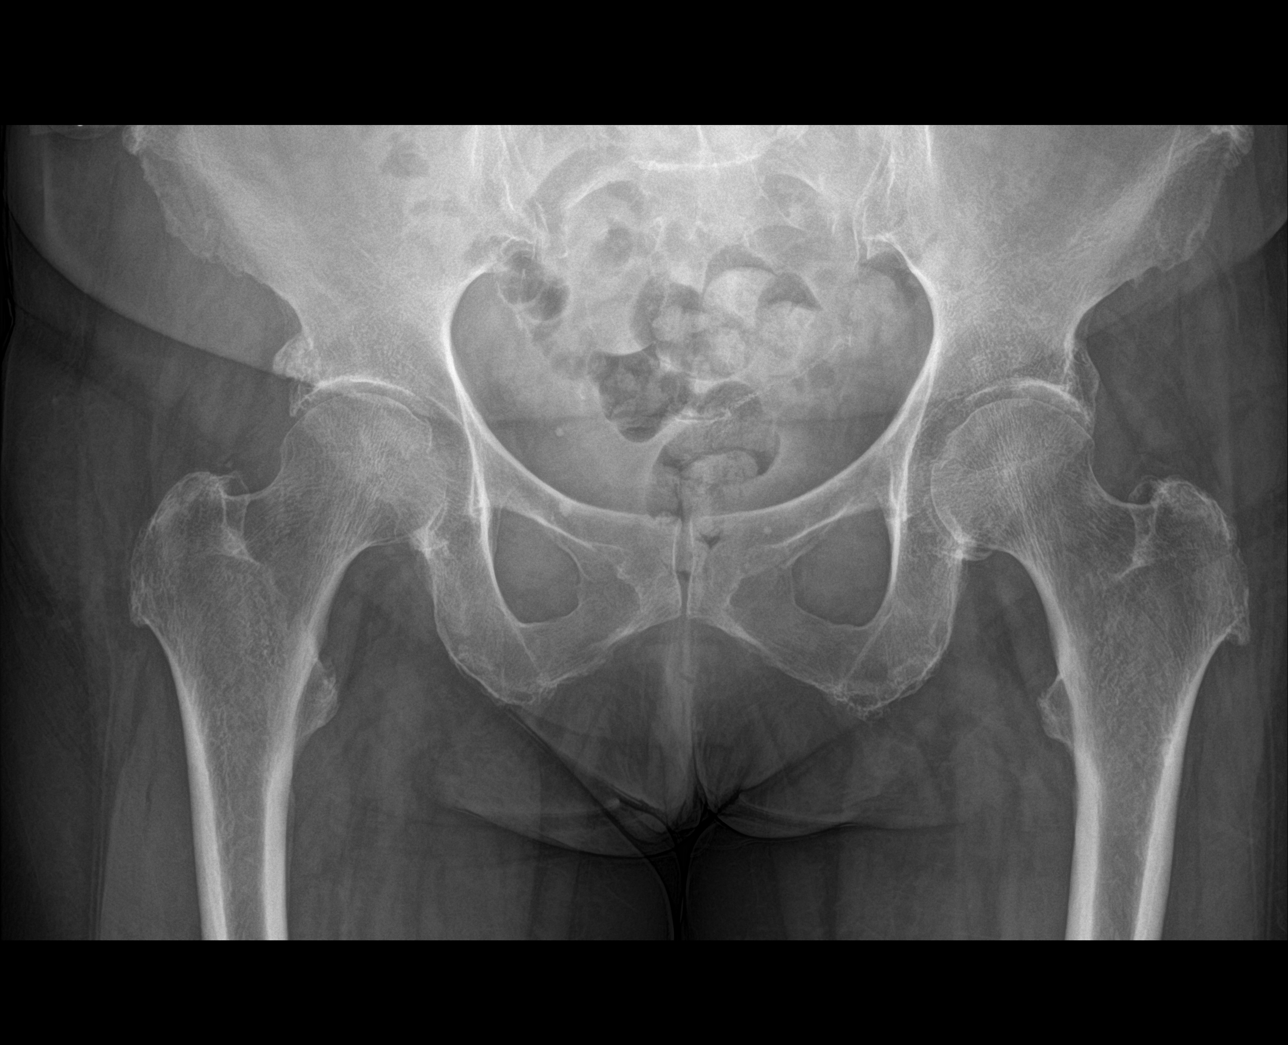

[hip ap]
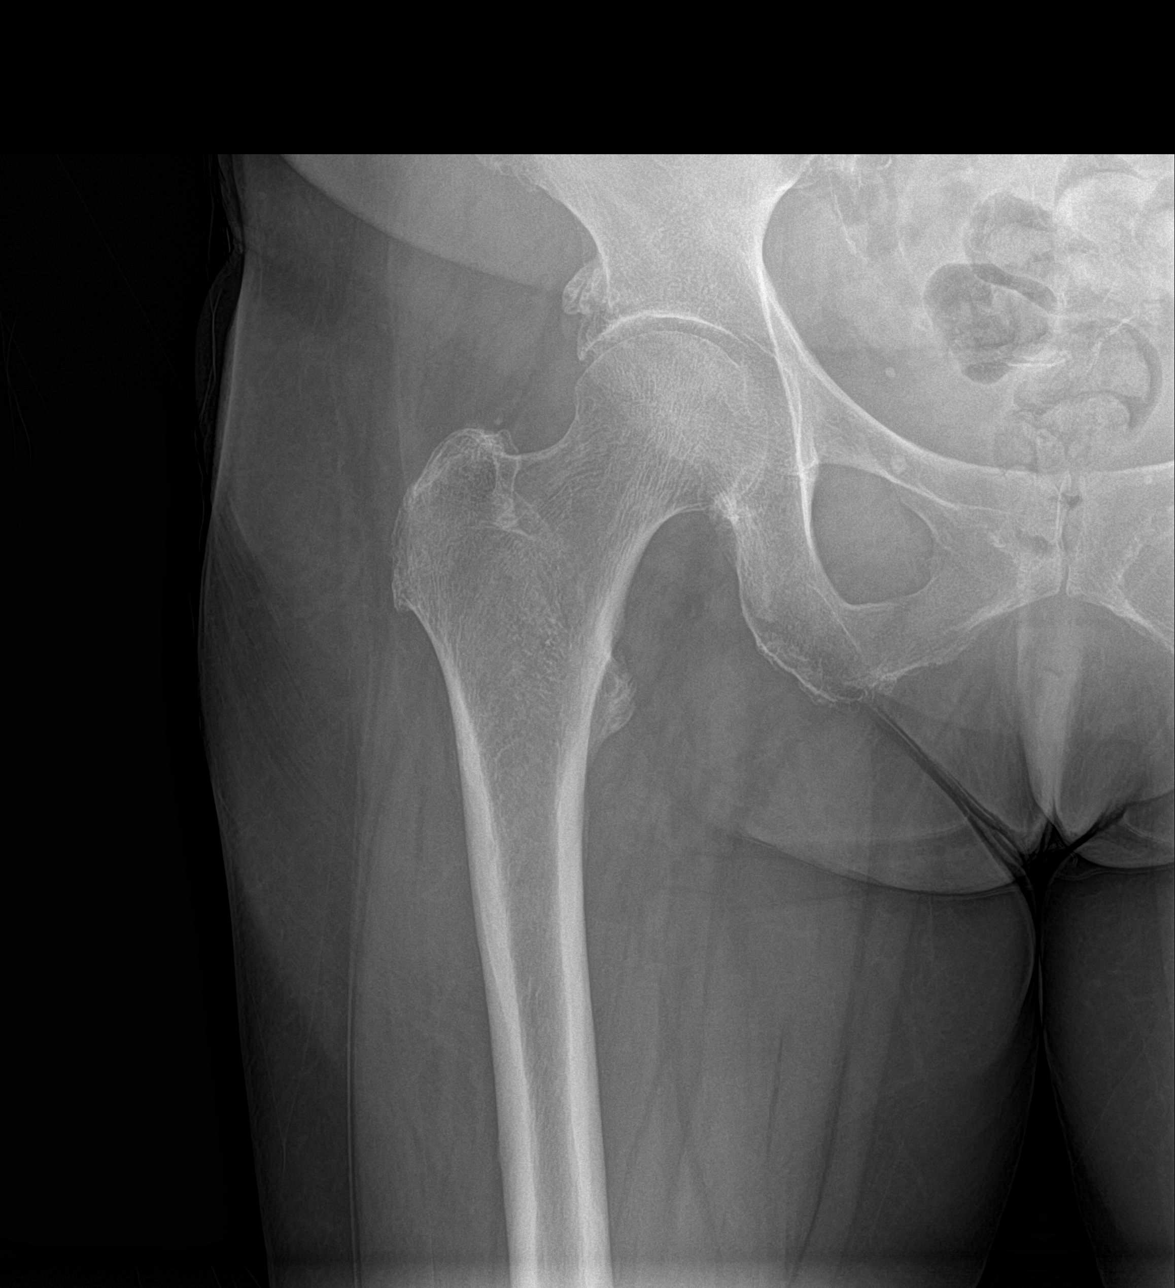

[hip lat]
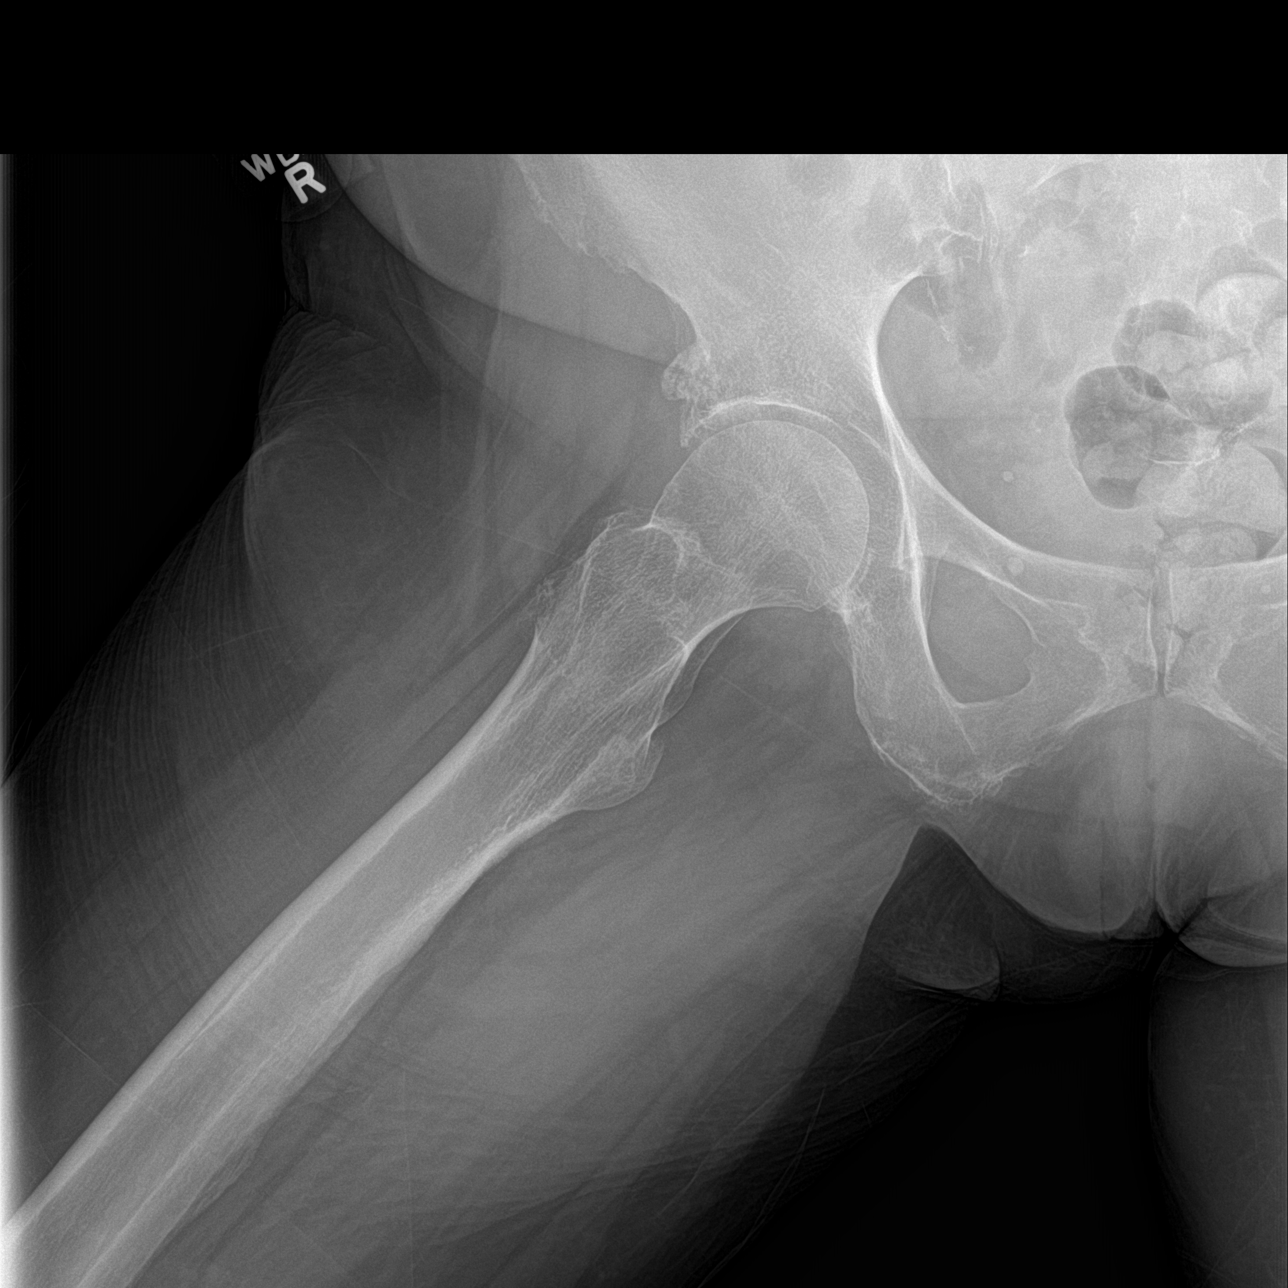

[pelvis ap (2 of 2)]
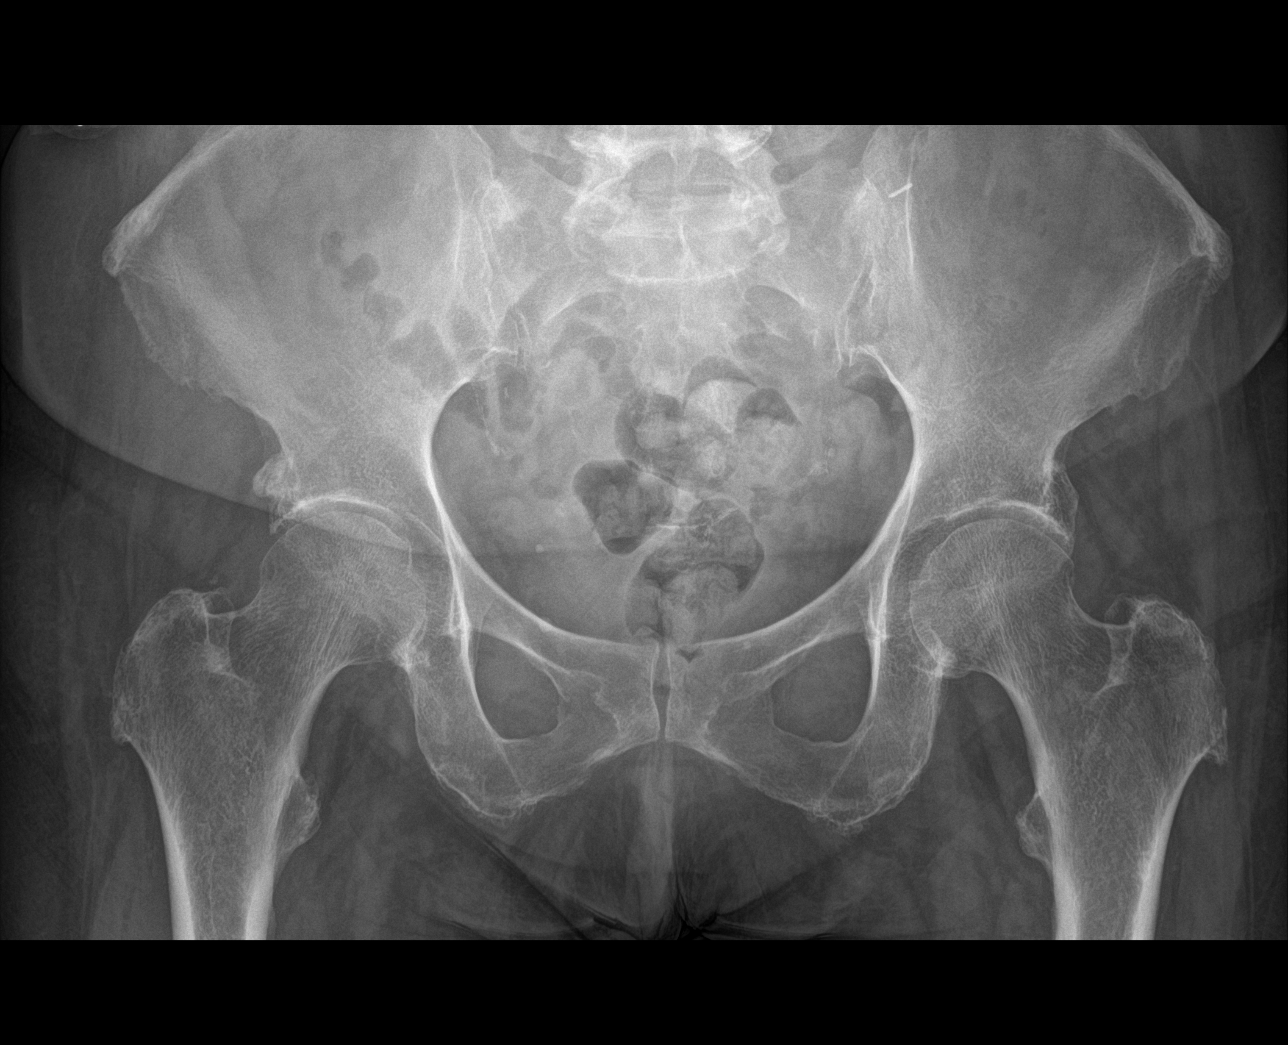

[4 of 4 positions shown; findings below may reference images not displayed]

FINDINGS: Degenerative changes about the RIGHT hip without signs of fracture
or dislocation. Degenerative changes also present on the LEFT.
Overall degenerative changes are moderate.

No sign of fracture or suspicious bony lesion in the pelvis.
IMPRESSION: Degenerative changes without acute finding.

## 2022-10-30 IMAGING — DX DG LUMBAR SPINE COMPLETE 4+V
6 series · 6 of 6 positions shown · non-contrast
Comparison: None Available.

CLINICAL DATA: Right low back pain and right hip pain for 3 weeks

EXAM:
LUMBAR SPINE - COMPLETE 4+ VIEW

[l-spine ap]
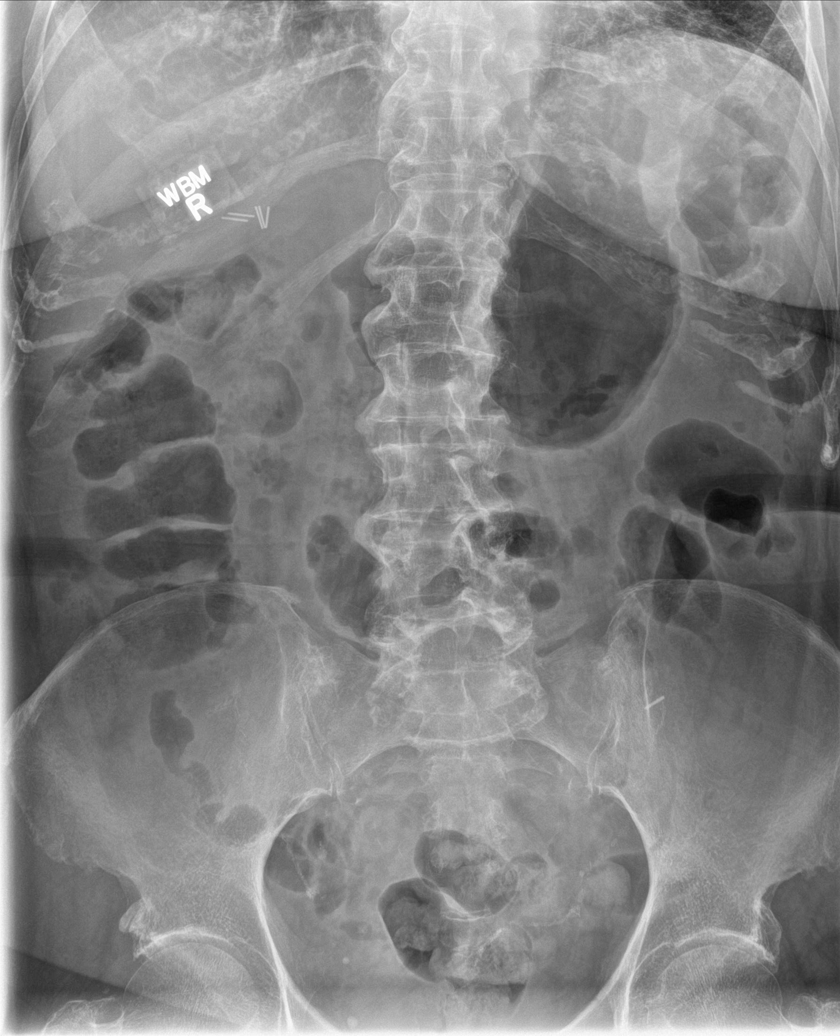

[l-spine obl (1 of 3)]
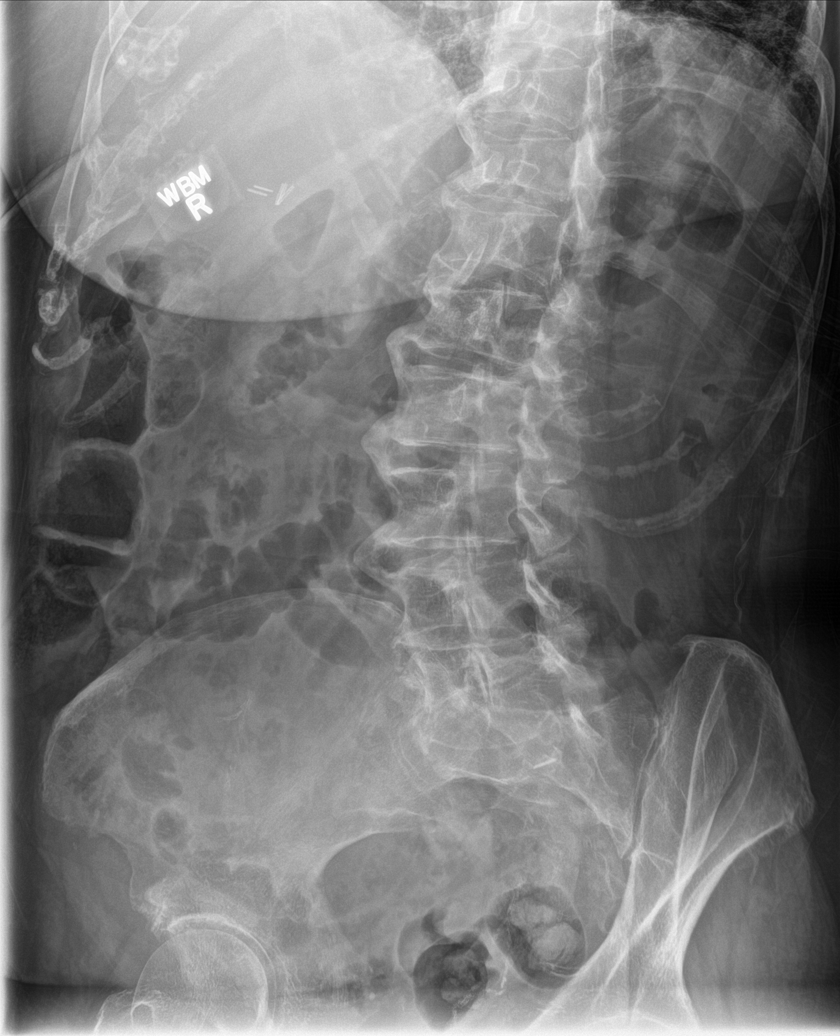

[l-spine obl (2 of 3)]
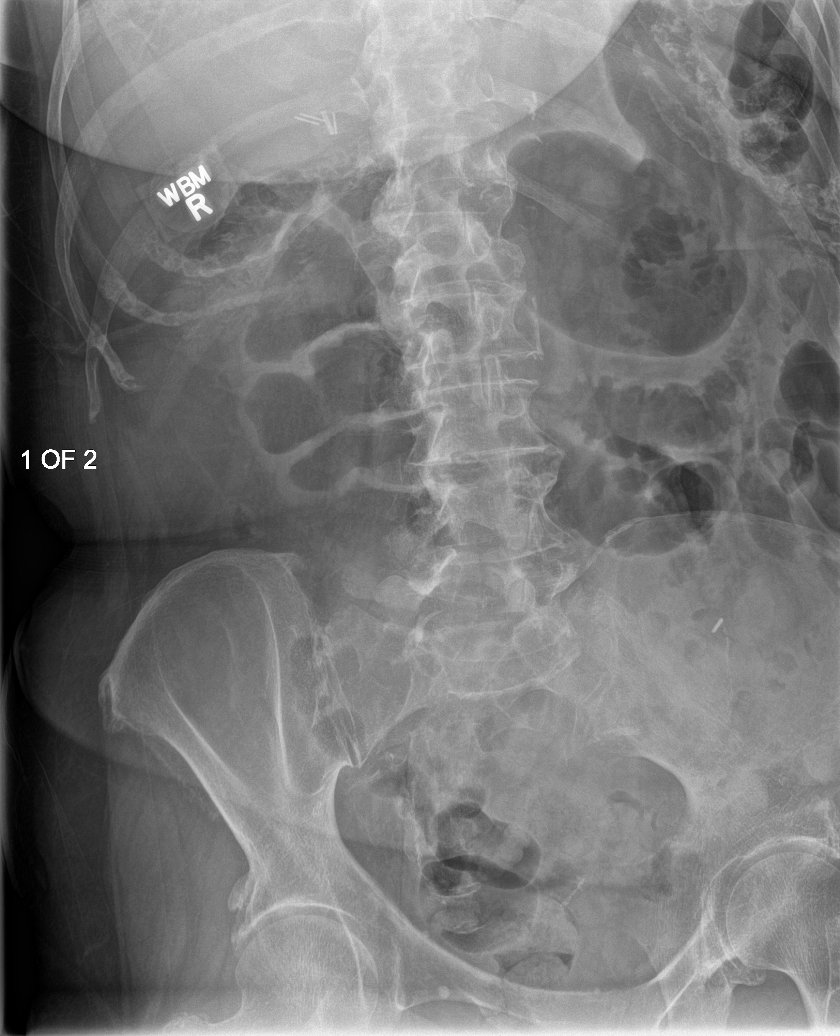

[l-spine spot]
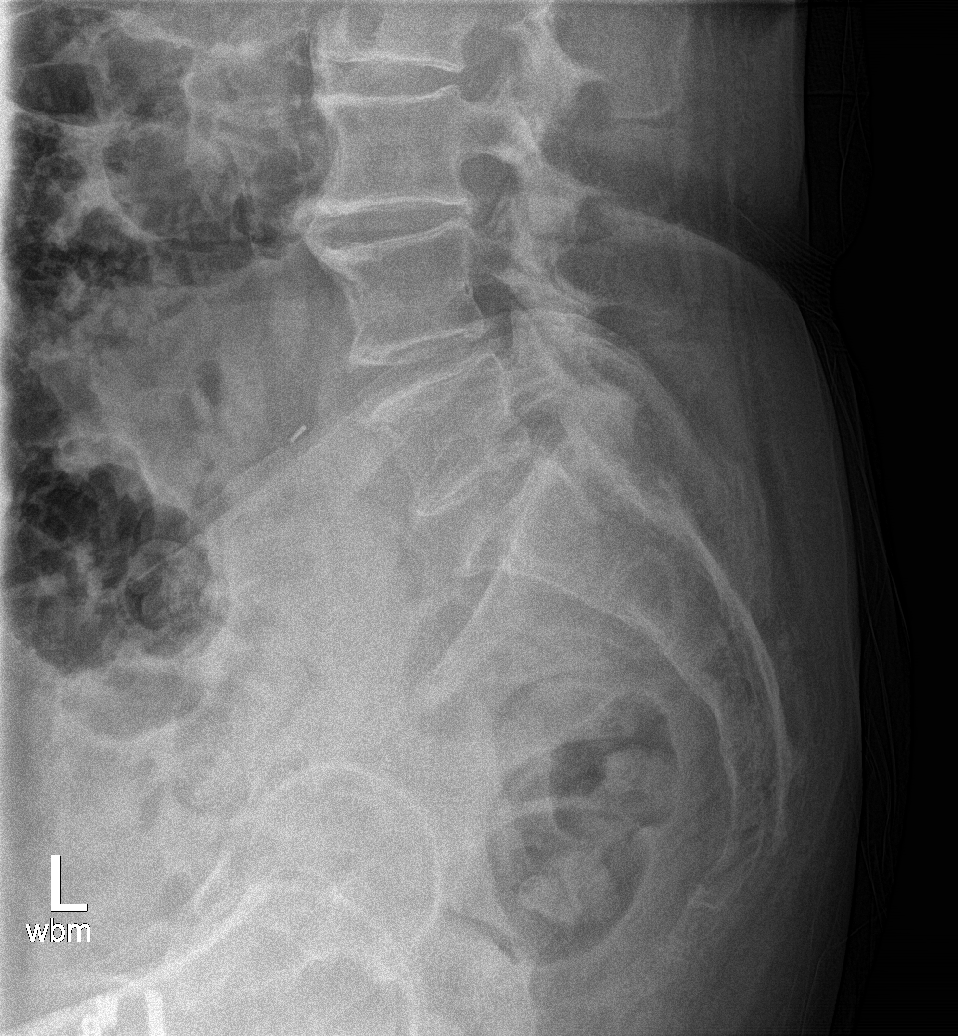

[l-spine obl (3 of 3)]
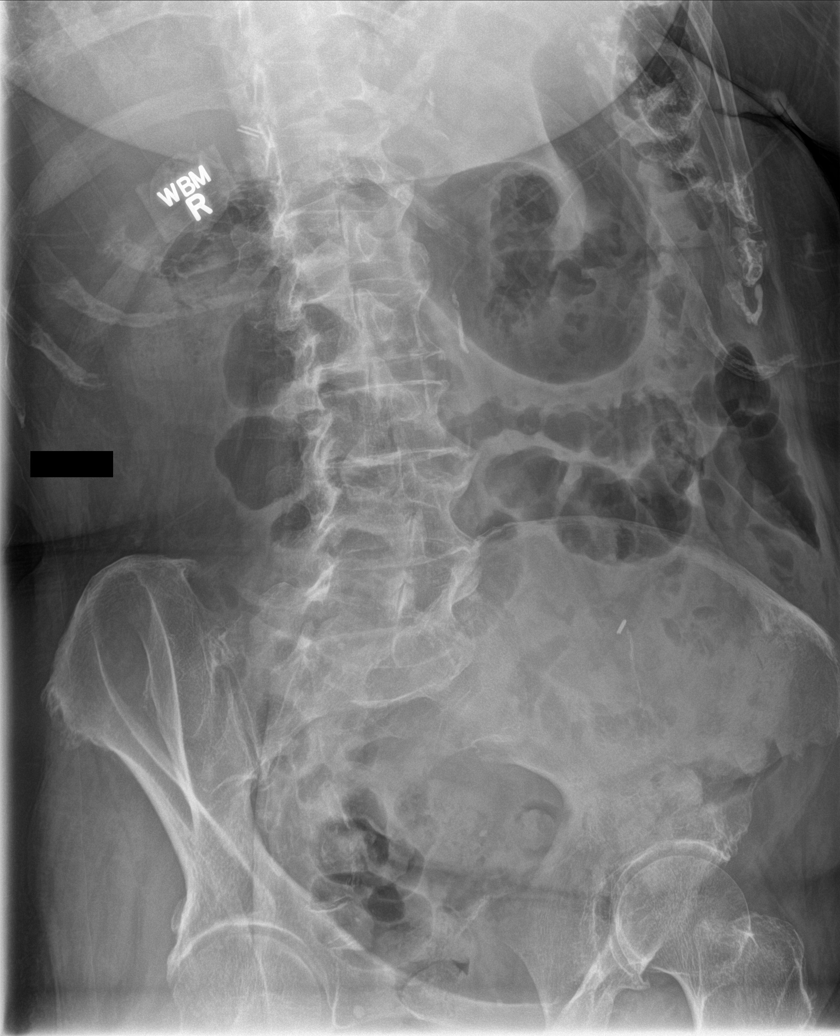

[l-spine lat]
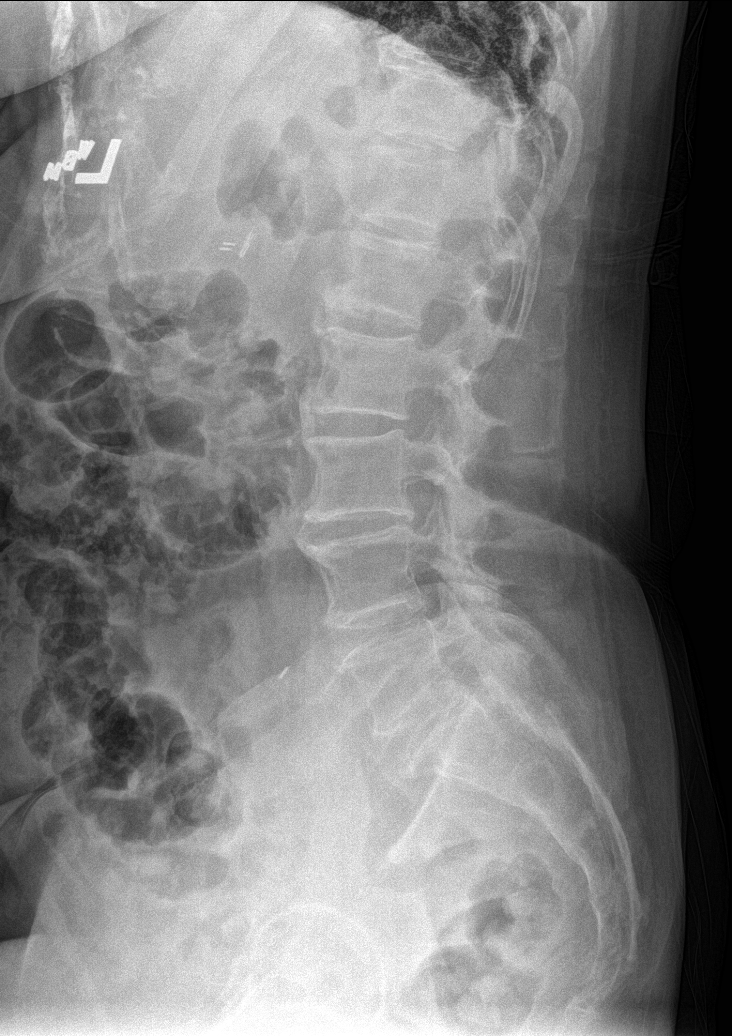

[6 of 6 positions shown; findings below may reference images not displayed]

FINDINGS: Five lumbar type vertebral segments. Vertebral body heights and
alignment are maintained. No fracture identified. Intervertebral
disc spaces are relatively preserved. Multilevel degenerative
endplate changes with bridging osteophytes. Mild-moderate lower
lumbar facet arthrosis.
IMPRESSION: Mild-to-moderate lumbar spondylosis.  No acute findings.

## 2022-10-31 ENCOUNTER — Encounter: Payer: Self-pay | Admitting: Nurse Practitioner

## 2022-11-16 ENCOUNTER — Other Ambulatory Visit: Payer: Self-pay | Admitting: Obstetrics and Gynecology

## 2022-11-16 DIAGNOSIS — N3941 Urge incontinence: Secondary | ICD-10-CM

## 2022-11-30 ENCOUNTER — Ambulatory Visit: Payer: Federal, State, Local not specified - PPO | Admitting: Psychiatry

## 2022-12-29 ENCOUNTER — Ambulatory Visit: Payer: Federal, State, Local not specified - PPO | Admitting: Physician Assistant

## 2022-12-30 ENCOUNTER — Ambulatory Visit: Payer: Federal, State, Local not specified - PPO | Admitting: Physician Assistant

## 2023-01-03 ENCOUNTER — Encounter: Payer: Self-pay | Admitting: Physician Assistant

## 2023-01-03 ENCOUNTER — Ambulatory Visit: Payer: Federal, State, Local not specified - PPO | Admitting: Physician Assistant

## 2023-01-03 VITALS — BP 136/76 | HR 59 | Ht 62.0 in | Wt 153.2 lb

## 2023-01-03 DIAGNOSIS — Z636 Dependent relative needing care at home: Secondary | ICD-10-CM

## 2023-01-03 DIAGNOSIS — M542 Cervicalgia: Secondary | ICD-10-CM | POA: Diagnosis not present

## 2023-01-03 MED ORDER — PREDNISONE 20 MG PO TABS
ORAL_TABLET | ORAL | 0 refills | Status: DC
Start: 2023-01-03 — End: 2023-03-07

## 2023-01-03 NOTE — Assessment & Plan Note (Signed)
Chronic, ongoing  She is still caring for her husband with Alzheimer's dementia and reports persistent stress  She is still seeing Dr. Elna Breslow for Psychiatry services. Dr. Elna Breslow is managing her medications at this time  Will defer to their recommendations at this time  Follow up as needed for persistent or progressing symptoms

## 2023-01-03 NOTE — Assessment & Plan Note (Addendum)
Chronic, ongoing since Feb 2024 She has been evaluated by Raechel Chute- reviewed MRI and most recent visit notes She has been scheduled with PT but does not feel like it is providing much benefit She thinks she was doing better in terms of mobility and pain when she was doing pool exercises and reports she was not having to use Tylenol at all while doing this Recommend communicating these reservations with PT and trying at least one more session  Recommend she returns to pool exercises for relief  If not improving with these measures, may try referral to Neurosurgery for evaluation and potential management Will provide Prednisone today to assist with inflammation as she cannot take NSAIDs Follow up in 2 months or sooner if concerns arise

## 2023-01-03 NOTE — Progress Notes (Signed)
Established Patient Office Visit  Name: Virginia West   MRN: [id number]    DOB: 1945/09/26   Date:01/03/2023  Today's Provider: Jacquelin Hawking, MHS, PA-C Introduced myself to the patient as a PA-C and provided education on APPs in clinical practice.         Subjective  Chief Complaint  Chief Complaint  Patient presents with   Depression    HPI   Neck pain   She reports continued pain on both sides of her neck and rotation is tighter with turning to the right  She has been seen by multiple providers including Ortho She has been diagnosed with herniated disc and was given injections but reports minimal improvement  She has also tried to get back in the pool and reports improvement after several exercise session in pool. States she was completely off Tylenol for 2 days before she started PT  She has been seeing physical therapy - with [facility] and states after doing the exercises they provided she is having constant pain again     DEPRESSION She reports ongoing caregiver stress  She states her husband cannot go out anymore and she feels "trapped" most of the time She is making strides to go to the pool for exercise  She is seeing Dr. Elna Breslow for psychiatry and reports this provider is managing her medications She is taking Wellbutrin 75 mg PO every day and Klonopin 0.25-0.5 mg PO PRN       01/03/2023   10:16 AM 10/12/2022   12:31 PM 09/30/2022   11:01 AM 09/02/2022   10:10 AM 08/01/2022   11:06 AM  Depression screen PHQ 2/9  Decreased Interest 1  1 2 1   Down, Depressed, Hopeless 1  1 2 1   PHQ - 2 Score 2  2 4 2   Altered sleeping 2  2 2 3   Tired, decreased energy 3  2 3 3   Change in appetite 0  0 0 0  Feeling bad or failure about yourself  1  1 1 1   Trouble concentrating 1  0 0 0  Moving slowly or fidgety/restless 0  0 0 0  Suicidal thoughts 0  0 0 0  PHQ-9 Score 9  7 10 9   Difficult doing work/chores Somewhat difficult  Somewhat difficult Somewhat difficult  Somewhat difficult     Information is confidential and restricted. Go to Review Flowsheets to unlock data.      01/03/2023   10:16 AM 10/12/2022   12:31 PM 09/30/2022   11:01 AM 09/02/2022   10:10 AM  GAD 7 : Generalized Anxiety Score  Nervous, Anxious, on Edge 1  1 1   Control/stop worrying 1  2 1   Worry too much - different things 1  2 1   Trouble relaxing 1  1 1   Restless 1  1 1   Easily annoyed or irritable 1  2 2   Afraid - awful might happen 2  2 2   Total GAD 7 Score 8  11 9   Anxiety Difficulty Somewhat difficult  Somewhat difficult Somewhat difficult     Information is confidential and restricted. Go to Review Flowsheets to unlock data.      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   Hip pain 11/02/2021   Pain of right hip 10/27/2021   Rash 10/27/2021   Bereavement 07/16/2021   Severe episode of recurrent major depressive disorder, without psychotic features (HCC) 07/16/2021   Depression, recurrent (HCC) 05/25/2021   Current mild episode of major depressive disorder (HCC) 04/27/2021   Caregiver stress 04/27/2021   Colon cancer screening    Urge incontinence of urine 03/16/2021   Elevated serum creatinine 03/16/2021   PTSD (post-traumatic stress disorder) 02/23/2021   Vitamin D deficiency 02/23/2021   Neuropathy 02/23/2021   B12 deficiency 02/23/2021   Screen for colon cancer 02/23/2021   Screening for osteoporosis 02/23/2021   Weight loss 02/23/2021   Encounter for screening for malignant neoplasm of breast 02/23/2021   Need for influenza vaccination 02/23/2021   Glaucoma 02/23/2021   Screening for cervical cancer  02/23/2021    Past Surgical History:  Procedure Laterality Date   ABDOMINAL HYSTERECTOMY     APPENDECTOMY     CHOLECYSTECTOMY     COLONOSCOPY WITH PROPOFOL N/A 03/24/2021   Procedure: COLONOSCOPY WITH PROPOFOL;  Surgeon: Toney Reil, MD;  Location: [facility] ENDOSCOPY;  Service: Gastroenterology;  Laterality: N/A;    Family History  Problem Relation Age of Onset   Heart disease Mother    Heart disease Father    Cancer Brother    Breast cancer Maternal Aunt 47   Cancer Son    Depression Son     Social History   Tobacco Use   Smoking status: Never    Passive exposure: Past   Smokeless tobacco: Never  Substance Use Topics   Alcohol use: Not Currently     Current Outpatient Medications:    buPROPion (WELLBUTRIN) 75 MG tablet, Take 1 tablet (75 mg total) by mouth daily with breakfast., Disp: 90 tablet, Rfl: 1   clobetasol ointment (TEMOVATE) 0.05 %, Apply topically 2 (two) times a week., Disp: 60 g, Rfl: 3   clonazePAM (KLONOPIN) 0.5 MG tablet, Take 0.5-1 tablets (0.25-0.5 mg total) by mouth daily as needed for anxiety. Take as needed for severe anxiety, grief- please limit use, Disp: 21 tablet, Rfl: 0   latanoprost (XALATAN) 0.005 % ophthalmic solution, SMARTSIG:1 In Eye(s) Every Night, Disp: , Rfl:    predniSONE (DELTASONE) 20 MG tablet, Take 60mg  PO daily x 2 days, then40mg  PO daily x 2 days, then 20mg  PO daily x 3 days, Disp: 13 tablet, Rfl: 0   timolol (TIMOPTIC) 0.5 % ophthalmic solution, SMARTSIG:In Eye(s), Disp: , Rfl:    tolterodine (DETROL LA) 2 MG 24 hr capsule, TAKE 1 CAPSULE BY MOUTH EVERY DAY, Disp: 90 capsule, Rfl: 0   zolpidem (AMBIEN) 5 MG tablet, Take 1 tablet (5 mg total) by mouth at bedtime as needed for sleep. (Patient not taking: Reported on 01/03/2023), Disp: 30 tablet, Rfl: 0  No Known Allergies  I personally reviewed active problem list, medication list, allergies, health maintenance, notes from last encounter, lab results with the patient/caregiver  today.   Review of Systems  Musculoskeletal:  Positive for neck pain.  Psychiatric/Behavioral:  Positive for depression. The patient is nervous/anxious.       Objective  Vitals:   01/03/23 1008  BP: 136/76  Pulse: (!) 59  SpO2: 97%  Weight: 153 lb 3.2 oz (69.5 kg)  Height: 5\' 2"  (1.575 m)    Body mass index is 28.02 kg/m.  Physical Exam Vitals reviewed.  Constitutional:  General: She is awake.     Appearance: Normal appearance. She is well-developed and well-groomed.  HENT:     Head: Normocephalic and atraumatic.  Neck:     Comments: Mildly decreased ROM of neck but improved since first visit in Feb  Neurological:     General: No focal deficit present.     Mental Status: She is alert and oriented to person, place, and time.     GCS: GCS eye subscore is 4. GCS verbal subscore is 5. GCS motor subscore is 6.     Cranial Nerves: No dysarthria or facial asymmetry.     Gait: Gait is intact.  Psychiatric:        Attention and Perception: Attention and perception normal.        Mood and Affect: Mood and affect normal.        Speech: Speech normal.        Behavior: Behavior normal. Behavior is cooperative.      No results found for this or any previous visit (from the past 2160 hour(s)).   PHQ2/9:    01/03/2023   10:16 AM 10/12/2022   12:31 PM 09/30/2022   11:01 AM 09/02/2022   10:10 AM 08/01/2022   11:06 AM  Depression screen PHQ 2/9  Decreased Interest 1  1 2 1   Down, Depressed, Hopeless 1  1 2 1   PHQ - 2 Score 2  2 4 2   Altered sleeping 2  2 2 3   Tired, decreased energy 3  2 3 3   Change in appetite 0  0 0 0  Feeling bad or failure about yourself  1  1 1 1   Trouble concentrating 1  0 0 0  Moving slowly or fidgety/restless 0  0 0 0  Suicidal thoughts 0  0 0 0  PHQ-9 Score 9  7 10 9   Difficult doing work/chores Somewhat difficult  Somewhat difficult Somewhat difficult Somewhat difficult     Information is confidential and restricted. Go to Review Flowsheets  to unlock data.      Fall Risk:    09/30/2022   11:01 AM 09/02/2022   10:10 AM 08/01/2022   11:05 AM 06/30/2022   10:38 AM 12/28/2021   10:38 AM  Fall Risk   Falls in the past year? 0 0 0 0 0  Number falls in past yr: 0 0 0 0 0  Injury with Fall? 0 0 0 0 0  Risk for fall due to : No Fall Risks No Fall Risks No Fall Risks No Fall Risks No Fall Risks  Follow up Falls evaluation completed Falls evaluation completed Falls evaluation completed Falls evaluation completed Falls evaluation completed      Functional Status Survey:      Assessment & Plan  Problem List Items Addressed This Visit       Other   Caregiver stress    Chronic, ongoing  She is still caring for her husband with Alzheimer's dementia and reports persistent stress  She is still seeing Dr. Elna Breslow for Psychiatry services. Dr. Elna Breslow is managing her medications at this time  Will defer to their recommendations at this time  Follow up as needed for persistent or progressing symptoms        Neck pain - Primary    Chronic, ongoing since Feb 2024 She has been evaluated by Raechel Chute- reviewed MRI and most recent visit notes She has been scheduled with PT but does not feel like it is providing much benefit She  thinks she was doing better in terms of mobility and pain when she was doing pool exercises and reports she was not having to use Tylenol at all while doing this Recommend communicating these reservations with PT and trying at least one more session  Recommend she returns to pool exercises for relief  If not improving with these measures, may try referral to Neurosurgery for evaluation and potential management Will provide Prednisone today to assist with inflammation as she cannot take NSAIDs Follow up in 2 months or sooner if concerns arise      Relevant Medications   predniSONE (DELTASONE) 20 MG tablet     Return in about 2 months (around 03/05/2023) for neck pain and mood .   I,  E , PA-C,  have reviewed all documentation for this visit. The documentation on 01/03/23 for the exam, diagnosis, procedures, and orders are all accurate and complete.   Jacquelin Hawking, MHS, PA-C [facility]

## 2023-01-18 ENCOUNTER — Ambulatory Visit: Payer: Federal, State, Local not specified - PPO | Admitting: Psychiatry

## 2023-02-10 ENCOUNTER — Other Ambulatory Visit: Payer: Self-pay | Admitting: Obstetrics and Gynecology

## 2023-02-10 DIAGNOSIS — N3941 Urge incontinence: Secondary | ICD-10-CM

## 2023-03-06 NOTE — Progress Notes (Unsigned)
There were no vitals taken for this visit.   Subjective:    Patient ID: Virginia West, female    DOB: 1945-10-21, 77 y.o.   MRN: [id number]  HPI: Virginia West is a 77 y.o. female  No chief complaint on file.  Neck pain   She reports continued pain on both sides of her neck and rotation is tighter with turning to the right  She has been seen by multiple providers including Ortho She has been diagnosed with herniated disc and was given injections but reports minimal improvement  She has also tried to get back in the pool and reports improvement after several exercise session in pool. States she was completely off Tylenol for 2 days before she started PT  She has been seeing physical therapy - with [facility] and states after doing the exercises they provided she is having constant pain again     DEPRESSION She reports ongoing caregiver stress  She states her husband cannot go out anymore and she feels "trapped" most of the time She is making strides to go to the pool for exercise  She is seeing Dr. Elna Breslow for psychiatry and reports this provider is managing her medications She is taking Wellbutrin 75 mg PO every day and Klonopin 0.25-0.5 mg PO PRN   Relevant past medical, surgical, family and social history reviewed and updated as indicated. Interim medical history since our last visit reviewed. Allergies and medications reviewed and updated.  Review of Systems  Per HPI unless specifically indicated above     Objective:    There were no vitals taken for this visit.  Wt Readings from Last 3 Encounters:  01/03/23 153 lb 3.2 oz (69.5 kg)  09/30/22 150 lb 12.8 oz (68.4 kg)  09/02/22 152 lb (68.9 kg)    Physical Exam  Results for orders placed or performed in visit on 09/30/22  Comp Met (CMET)  Result Value Ref Range   Glucose 87 70 - 99 mg/dL   BUN 27 8 - 27 mg/dL   Creatinine, Ser 4.09 (H) 0.57 - 1.00 mg/dL   eGFR 41 (L) >81 XB/JYN/8.29   BUN/Creatinine Ratio 20 12  - 28   Sodium 139 134 - 144 mmol/L   Potassium 4.8 3.5 - 5.2 mmol/L   Chloride 104 96 - 106 mmol/L   CO2 18 (L) 20 - 29 mmol/L   Calcium 10.2 8.7 - 10.3 mg/dL   Total Protein 7.8 6.0 - 8.5 g/dL   Albumin 4.4 3.8 - 4.8 g/dL   Globulin, Total 3.4 1.5 - 4.5 g/dL   Albumin/Globulin Ratio 1.3 1.2 - 2.2   Bilirubin Total 0.3 0.0 - 1.2 mg/dL   Alkaline Phosphatase 100 44 - 121 IU/L   AST 25 0 - 40 IU/L   ALT 18 0 - 32 IU/L  CBC w/Diff  Result Value Ref Range   WBC 11.0 (H) 3.4 - 10.8 x10E3/uL   RBC 4.60 3.77 - 5.28 x10E6/uL   Hemoglobin 13.6 11.1 - 15.9 g/dL   Hematocrit 56.2 13.0 - 46.6 %   MCV 88 79 - 97 fL   MCH 29.6 26.6 - 33.0 pg   MCHC 33.5 31.5 - 35.7 g/dL   RDW 86.5 78.4 - 69.6 %   Platelets 374 150 - 450 x10E3/uL   Neutrophils 69 Not Estab. %   Lymphs 19 Not Estab. %   Monocytes 7 Not Estab. %   Eos 4 Not Estab. %   Basos 1 Not Estab. %  Neutrophils Absolute 7.7 (H) 1.4 - 7.0 x10E3/uL   Lymphocytes Absolute 2.1 0.7 - 3.1 x10E3/uL   Monocytes Absolute 0.8 0.1 - 0.9 x10E3/uL   EOS (ABSOLUTE) 0.4 0.0 - 0.4 x10E3/uL   Basophils Absolute 0.1 0.0 - 0.2 x10E3/uL   Immature Granulocytes 0 Not Estab. %   Immature Grans (Abs) 0.0 0.0 - 0.1 x10E3/uL  B12  Result Value Ref Range   Vitamin B-12 534 232 - 1,245 pg/mL  Vitamin D (25 hydroxy)  Result Value Ref Range   Vit D, 25-Hydroxy 21.2 (L) 30.0 - 100.0 ng/mL  TSH  Result Value Ref Range   TSH 0.782 0.450 - 4.500 uIU/mL      Assessment & Plan:   Problem List Items Addressed This Visit   None    Follow up plan: No follow-ups on file.

## 2023-03-07 ENCOUNTER — Ambulatory Visit: Payer: Federal, State, Local not specified - PPO | Admitting: Nurse Practitioner

## 2023-03-07 ENCOUNTER — Encounter: Payer: Self-pay | Admitting: Nurse Practitioner

## 2023-03-07 VITALS — BP 138/75 | HR 59 | Temp 98.0°F | Wt 159.2 lb

## 2023-03-07 DIAGNOSIS — N1831 Chronic kidney disease, stage 3a: Secondary | ICD-10-CM | POA: Diagnosis not present

## 2023-03-07 DIAGNOSIS — G629 Polyneuropathy, unspecified: Secondary | ICD-10-CM

## 2023-03-07 DIAGNOSIS — Z23 Encounter for immunization: Secondary | ICD-10-CM | POA: Diagnosis not present

## 2023-03-07 DIAGNOSIS — E559 Vitamin D deficiency, unspecified: Secondary | ICD-10-CM

## 2023-03-07 DIAGNOSIS — R7309 Other abnormal glucose: Secondary | ICD-10-CM | POA: Diagnosis not present

## 2023-03-07 DIAGNOSIS — F332 Major depressive disorder, recurrent severe without psychotic features: Secondary | ICD-10-CM

## 2023-03-07 DIAGNOSIS — F4310 Post-traumatic stress disorder, unspecified: Secondary | ICD-10-CM

## 2023-03-07 DIAGNOSIS — E538 Deficiency of other specified B group vitamins: Secondary | ICD-10-CM | POA: Diagnosis not present

## 2023-03-07 MED ORDER — GABAPENTIN 100 MG PO CAPS
100.0000 mg | ORAL_CAPSULE | Freq: Every day | ORAL | 1 refills | Status: DC
Start: 2023-03-07 — End: 2023-04-11

## 2023-03-07 NOTE — Assessment & Plan Note (Signed)
Labs ordered at visit today.  Will make recommendations based on lab results.   

## 2023-03-07 NOTE — Assessment & Plan Note (Signed)
Chronic.  Has been seeing Dr. Elna Breslow but no longer plans to follow up.  Feels like she is well maintained, despite PHQ9, on Wellbutrin 75mg  daily.  Does not use the Clonazepam.  Follow up in 3 months.  Call sooner if concerns arise.

## 2023-03-07 NOTE — Assessment & Plan Note (Signed)
Chronic.  Continues to follow up with Nephrology.   Will recheck CMP at visit today.  Follow up in 3 months.  Call sooner if concerns arise.

## 2023-03-07 NOTE — Assessment & Plan Note (Signed)
Chronic.  Will start Gabapentin 100mg  nightly.  Side effects and benefits of medications discussed.  Follow up in 1 month.  Can increase dose if helpful at next visit.

## 2023-03-08 LAB — COMPREHENSIVE METABOLIC PANEL
ALT: 10 [IU]/L (ref 0–32)
AST: 20 [IU]/L (ref 0–40)
Albumin: 4.1 g/dL (ref 3.8–4.8)
Alkaline Phosphatase: 80 [IU]/L (ref 44–121)
BUN/Creatinine Ratio: 16 (ref 12–28)
BUN: 20 mg/dL (ref 8–27)
Bilirubin Total: 0.3 mg/dL (ref 0.0–1.2)
CO2: 16 mmol/L — ABNORMAL LOW (ref 20–29)
Calcium: 9.4 mg/dL (ref 8.7–10.3)
Chloride: 103 mmol/L (ref 96–106)
Creatinine, Ser: 1.22 mg/dL — ABNORMAL HIGH (ref 0.57–1.00)
Globulin, Total: 3.3 g/dL (ref 1.5–4.5)
Glucose: 85 mg/dL (ref 70–99)
Potassium: 5.6 mmol/L — ABNORMAL HIGH (ref 3.5–5.2)
Sodium: 138 mmol/L (ref 134–144)
Total Protein: 7.4 g/dL (ref 6.0–8.5)
eGFR: 46 mL/min/{1.73_m2} — ABNORMAL LOW (ref >59)

## 2023-03-08 LAB — HEMOGLOBIN A1C
Est. average glucose Bld gHb Est-mCnc: 117 mg/dL
Hgb A1c MFr Bld: 5.7 % — ABNORMAL HIGH (ref 4.8–5.6)

## 2023-03-08 LAB — VITAMIN B12: Vitamin B-12: 466 pg/mL (ref 232–1245)

## 2023-03-08 LAB — VITAMIN D 25 HYDROXY (VIT D DEFICIENCY, FRACTURES): Vit D, 25-Hydroxy: 22.8 ng/mL — ABNORMAL LOW (ref 30.0–100.0)

## 2023-03-08 MED ORDER — VITAMIN D (ERGOCALCIFEROL) 1.25 MG (50000 UNIT) PO CAPS
50000.0000 [IU] | ORAL_CAPSULE | ORAL | 0 refills | Status: AC
Start: 2023-03-08 — End: ?

## 2023-03-08 NOTE — Addendum Note (Signed)
Addended by: Larae Grooms on: 03/08/2023 07:54 AM   Modules accepted: Orders

## 2023-03-08 NOTE — Progress Notes (Signed)
HI Virginia West. It was good to see you yesterday.  Your lab work shows that your kidney function is stable, which is great.  Your vitamin D is low.  I have sent in a prescription for Vitamin D 50,000 international units  once weekly.  We will recheck this in 3 months.  Otherwise, your lab work looks good.  No other concerns at this time.

## 2023-04-10 NOTE — Progress Notes (Unsigned)
   There were no vitals taken for this visit.   Subjective:    Patient ID: Virginia West, female    DOB: [birth year], 77 y.o.   MRN: [id number]  HPI: Virginia West is a 77 y.o. female  No chief complaint on file.  NECK PAIN She reports continued pain on both sides of her neck and rotation is tighter with turning to the right  She has been seen by multiple providers including Ortho She has been diagnosed with herniated disc and was given injections but reports minimal improvement  She has also tried to get back in the pool and reports improvement after several exercise session in pool. States she was completely off Tylenol for 2 days before she started PT  She has been seeing physical therapy - with [facility] and states after doing the exercises they provided she is having constant pain again   Patient states her neck pain is 90% back.  Physical therapy didn't really help due to having a herniated disc.  She is feeling a lot of weakness in her hands.  Lifting a gallon of milk is very difficult.   Relevant past medical, surgical, family and social history reviewed and updated as indicated. Interim medical history since our last visit reviewed. Allergies and medications reviewed and updated.  Review of Systems  Per HPI unless specifically indicated above     Objective:    There were no vitals taken for this visit.  Wt Readings from Last 3 Encounters:  03/07/23 159 lb 3.2 oz (72.2 kg)  01/03/23 153 lb 3.2 oz (69.5 kg)  09/30/22 150 lb 12.8 oz (68.4 kg)    Physical Exam  Results for orders placed or performed in visit on 03/07/23  Vitamin D (25 hydroxy)  Result Value Ref Range   Vit D, 25-Hydroxy 22.8 (L) 30.0 - 100.0 ng/mL  Comp Met (CMET)  Result Value Ref Range   Glucose 85 70 - 99 mg/dL   BUN 20 8 - 27 mg/dL   Creatinine, Ser 1.19 (H) 0.57 - 1.00 mg/dL   eGFR 46 (L) >14 NW/GNF/6.21   BUN/Creatinine Ratio 16 12 - 28   Sodium 138 134 - 144 mmol/L   Potassium 5.6 (H) 3.5 -  5.2 mmol/L   Chloride 103 96 - 106 mmol/L   CO2 16 (L) 20 - 29 mmol/L   Calcium 9.4 8.7 - 10.3 mg/dL   Total Protein 7.4 6.0 - 8.5 g/dL   Albumin 4.1 3.8 - 4.8 g/dL   Globulin, Total 3.3 1.5 - 4.5 g/dL   Bilirubin Total 0.3 0.0 - 1.2 mg/dL   Alkaline Phosphatase 80 44 - 121 IU/L   AST 20 0 - 40 IU/L   ALT 10 0 - 32 IU/L  HgB A1c  Result Value Ref Range   Hgb A1c MFr Bld 5.7 (H) 4.8 - 5.6 %   Est. average glucose Bld gHb Est-mCnc 117 mg/dL  H08  Result Value Ref Range   Vitamin B-12 466 232 - 1,245 pg/mL      Assessment & Plan:   Problem List Items Addressed This Visit   None    Follow up plan: No follow-ups on file.

## 2023-04-11 ENCOUNTER — Encounter: Payer: Self-pay | Admitting: Nurse Practitioner

## 2023-04-11 ENCOUNTER — Ambulatory Visit: Payer: Federal, State, Local not specified - PPO | Admitting: Nurse Practitioner

## 2023-04-11 VITALS — BP 155/74 | HR 54 | Temp 97.5°F | Ht 62.0 in | Wt 157.6 lb

## 2023-04-11 DIAGNOSIS — G629 Polyneuropathy, unspecified: Secondary | ICD-10-CM

## 2023-04-11 DIAGNOSIS — E559 Vitamin D deficiency, unspecified: Secondary | ICD-10-CM | POA: Diagnosis not present

## 2023-04-11 DIAGNOSIS — I10 Essential (primary) hypertension: Secondary | ICD-10-CM | POA: Insufficient documentation

## 2023-04-11 DIAGNOSIS — M25649 Stiffness of unspecified hand, not elsewhere classified: Secondary | ICD-10-CM | POA: Diagnosis not present

## 2023-04-11 MED ORDER — GABAPENTIN 300 MG PO CAPS: 300.0000 mg | ORAL_CAPSULE | Freq: Every day | ORAL | 1 refills | Status: DC

## 2023-04-11 MED ORDER — LOSARTAN POTASSIUM 25 MG PO TABS: 25.0000 mg | ORAL_TABLET | Freq: Every day | ORAL | 1 refills | Status: DC

## 2023-04-11 NOTE — Assessment & Plan Note (Signed)
Chronic.  Ongoing concern.  Will increase Gabapentin to 300mg  daily since it patient is already seeing benefits of medication.

## 2023-04-11 NOTE — Assessment & Plan Note (Signed)
Reviewed results from previous visit.  Patient was previously Vitamin D once monthly.  Encouraged patient to take it once weekly.  If not improved, will increase to twice weekly.

## 2023-04-11 NOTE — Assessment & Plan Note (Signed)
Chronic.  Has been elevated over the last few visits.  Will start Losartan 25mg  daily.  Side effects and benefits of medication discussed. Will check CMP next week.  Follow up in 6 weeks.

## 2023-04-18 ENCOUNTER — Other Ambulatory Visit: Payer: Federal, State, Local not specified - PPO

## 2023-04-18 DIAGNOSIS — I10 Essential (primary) hypertension: Secondary | ICD-10-CM

## 2023-04-18 DIAGNOSIS — M25649 Stiffness of unspecified hand, not elsewhere classified: Secondary | ICD-10-CM

## 2023-04-18 DIAGNOSIS — R768 Other specified abnormal immunological findings in serum: Secondary | ICD-10-CM

## 2023-04-21 LAB — FANA STAINING PATTERNS: Speckled Pattern: 1:80 {titer}

## 2023-04-21 LAB — CK: Total CK: 96 U/L (ref 32–182)

## 2023-04-21 LAB — ANA+ENA+DNA/DS+ANTICH+CENTR
ANA Titer 1: POSITIVE — AB
Anti JO-1: <0.2 AI (ref 0.0–0.9)
Centromere Ab Screen: <0.2 AI (ref 0.0–0.9)
Chromatin Ab SerPl-aCnc: 0.3 AI (ref 0.0–0.9)
ENA RNP Ab: 0.2 AI (ref 0.0–0.9)
ENA SM Ab Ser-aCnc: <0.2 AI (ref 0.0–0.9)
ENA SSA (RO) Ab: <0.2 AI (ref 0.0–0.9)
ENA SSB (LA) Ab: <0.2 AI (ref 0.0–0.9)
Scleroderma (Scl-70) (ENA) Antibody, IgG: <0.2 AI (ref 0.0–0.9)
dsDNA Ab: <1 [IU]/mL (ref 0–9)

## 2023-04-21 LAB — EHRLICHIA ANTIBODY PANEL
E. Chaffeensis (HME) IgM Titer: NEGATIVE
E.Chaffeensis (HME) IgG: NEGATIVE
HGE IgG Titer: NEGATIVE
HGE IgM Titer: NEGATIVE

## 2023-04-21 LAB — COMPREHENSIVE METABOLIC PANEL
ALT: 10 [IU]/L (ref 0–32)
AST: 18 [IU]/L (ref 0–40)
Albumin: 4.1 g/dL (ref 3.8–4.8)
Alkaline Phosphatase: 72 [IU]/L (ref 44–121)
BUN/Creatinine Ratio: 12 (ref 12–28)
BUN: 18 mg/dL (ref 8–27)
Bilirubin Total: 0.3 mg/dL (ref 0.0–1.2)
CO2: 19 mmol/L — ABNORMAL LOW (ref 20–29)
Calcium: 9 mg/dL (ref 8.7–10.3)
Chloride: 103 mmol/L (ref 96–106)
Creatinine, Ser: 1.5 mg/dL — ABNORMAL HIGH (ref 0.57–1.00)
Globulin, Total: 2.9 g/dL (ref 1.5–4.5)
Glucose: 86 mg/dL (ref 70–99)
Potassium: 4.9 mmol/L (ref 3.5–5.2)
Sodium: 136 mmol/L (ref 134–144)
Total Protein: 7 g/dL (ref 6.0–8.5)
eGFR: 36 mL/min/{1.73_m2} — ABNORMAL LOW (ref >59)

## 2023-04-21 LAB — RHEUMATOID FACTOR: Rheumatoid fact SerPl-aCnc: <10 [IU]/mL (ref <14.0)

## 2023-04-21 LAB — ANA: Anti Nuclear Antibody (ANA): NEGATIVE

## 2023-04-21 LAB — URIC ACID: Uric Acid: 7.2 mg/dL (ref 3.1–7.9)

## 2023-04-24 NOTE — Addendum Note (Signed)
Addended by: Larae Grooms on: 04/24/2023 08:05 AM   Modules accepted: Orders

## 2023-05-17 ENCOUNTER — Other Ambulatory Visit: Payer: Self-pay | Admitting: Obstetrics and Gynecology

## 2023-05-17 DIAGNOSIS — N3941 Urge incontinence: Secondary | ICD-10-CM

## 2023-05-22 ENCOUNTER — Encounter: Payer: Self-pay | Admitting: Nurse Practitioner

## 2023-05-22 ENCOUNTER — Ambulatory Visit: Payer: Federal, State, Local not specified - PPO | Admitting: Nurse Practitioner

## 2023-05-22 VITALS — BP 138/84 | HR 62 | Temp 97.6°F | Ht 62.0 in | Wt 154.0 lb

## 2023-05-22 DIAGNOSIS — I10 Essential (primary) hypertension: Secondary | ICD-10-CM | POA: Diagnosis not present

## 2023-05-22 NOTE — Progress Notes (Signed)
BP 138/84 (BP Location: Left Arm, Patient Position: Sitting, Cuff Size: Normal)   Pulse 62   Temp 97.6 F (36.4 C) (Oral)   Ht 5\' 2"  (1.575 m)   Wt 154 lb (69.9 kg)   SpO2 98%   BMI 28.17 kg/m    Subjective:    Patient ID: Virginia West, female    DOB: [birth year], 77 y.o.   MRN: [id number]  HPI: Virginia West is a 77 y.o. female  Chief Complaint  Patient presents with   6 week follow up    Blood Pressure Check   HYPERTENSION with Chronic Kidney Disease Hypertension status: controlled  Satisfied with current treatment? no Duration of hypertension: chronic BP monitoring frequency:  daily BP range: 135/85 BP medication side effects:  no Medication compliance: excellent compliance Previous BP meds:losartan (cozaar) Aspirin: no Recurrent headaches: no Visual changes: no Palpitations: no Dyspnea: no Chest pain: no Lower extremity edema: no Dizzy/lightheaded: no Makes her really tired.   Relevant past medical, surgical, family and social history reviewed and updated as indicated. Interim medical history since our last visit reviewed. Allergies and medications reviewed and updated.  Review of Systems  Constitutional:  Positive for fatigue.  Eyes:  Negative for visual disturbance.  Respiratory:  Negative for cough, chest tightness and shortness of breath.   Cardiovascular:  Negative for chest pain, palpitations and leg swelling.  Neurological:  Negative for dizziness and headaches.    Per HPI unless specifically indicated above     Objective:    BP 138/84 (BP Location: Left Arm, Patient Position: Sitting, Cuff Size: Normal)   Pulse 62   Temp 97.6 F (36.4 C) (Oral)   Ht 5\' 2"  (1.575 m)   Wt 154 lb (69.9 kg)   SpO2 98%   BMI 28.17 kg/m   Wt Readings from Last 3 Encounters:  05/22/23 154 lb (69.9 kg)  04/11/23 157 lb 9.6 oz (71.5 kg)  03/07/23 159 lb 3.2 oz (72.2 kg)    Physical Exam Vitals and nursing note reviewed.  Constitutional:      General: She  is not in acute distress.    Appearance: Normal appearance. She is normal weight. She is not ill-appearing, toxic-appearing or diaphoretic.  HENT:     Head: Normocephalic.     Right Ear: External ear normal.     Left Ear: External ear normal.     Nose: Nose normal.     Mouth/Throat:     Mouth: Mucous membranes are moist.     Pharynx: Oropharynx is clear.  Eyes:     General:        Right eye: No discharge.        Left eye: No discharge.     Extraocular Movements: Extraocular movements intact.     Conjunctiva/sclera: Conjunctivae normal.     Pupils: Pupils are equal, round, and reactive to light.  Cardiovascular:     Rate and Rhythm: Normal rate and regular rhythm.     Heart sounds: No murmur heard. Pulmonary:     Effort: Pulmonary effort is normal. No respiratory distress.     Breath sounds: Normal breath sounds. No wheezing or rales.  Musculoskeletal:     Cervical back: Normal range of motion and neck supple.  Skin:    General: Skin is warm and dry.     Capillary Refill: Capillary refill takes less than 2 seconds.  Neurological:     General: No focal deficit present.     Mental Status: She is  alert and oriented to person, place, and time. Mental status is at baseline.  Psychiatric:        Mood and Affect: Mood normal.        Behavior: Behavior normal.        Thought Content: Thought content normal.        Judgment: Judgment normal.     Results for orders placed or performed in visit on 04/18/23  Ehrlichia antibody panel   Collection Time: 04/18/23  9:58 AM  Result Value Ref Range   E.Chaffeensis (HME) IgG Negative Neg:<1:64   E. Chaffeensis (HME) IgM Titer Negative Neg:<1:20   HGE IgG Titer Negative Neg:<1:64   HGE IgM Titer Negative Neg:<1:20   Result Comment: Comment   Uric acid   Collection Time: 04/18/23  9:58 AM  Result Value Ref Range   Uric Acid 7.2 3.1 - 7.9 mg/dL  CK   Collection Time: 04/18/23  9:58 AM  Result Value Ref Range   Total CK 96 32 - 182 U/L   ANA+ENA+DNA/DS+Antich+Centr   Collection Time: 04/18/23  9:58 AM  Result Value Ref Range   ANA Titer 1 Positive (A)    dsDNA Ab <1 0 - 9 IU/mL   ENA RNP Ab 0.2 0.0 - 0.9 AI   ENA SM Ab Ser-aCnc <0.2 0.0 - 0.9 AI   Scleroderma (Scl-70) (ENA) Antibody, IgG <0.2 0.0 - 0.9 AI   ENA SSA (RO) Ab <0.2 0.0 - 0.9 AI   ENA SSB (LA) Ab <0.2 0.0 - 0.9 AI   Chromatin Ab SerPl-aCnc 0.3 0.0 - 0.9 AI   Anti JO-1 <0.2 0.0 - 0.9 AI   Centromere Ab Screen <0.2 0.0 - 0.9 AI   See below: Comment   ANA   Collection Time: 04/18/23  9:58 AM  Result Value Ref Range   Anti Nuclear Antibody (ANA) Negative Negative  Rheumatoid factor   Collection Time: 04/18/23  9:58 AM  Result Value Ref Range   Rheumatoid fact SerPl-aCnc <10.0 <14.0 IU/mL  Comp Met (CMET)   Collection Time: 04/18/23  9:58 AM  Result Value Ref Range   Glucose 86 70 - 99 mg/dL   BUN 18 8 - 27 mg/dL   Creatinine, Ser 4.09 (H) 0.57 - 1.00 mg/dL   eGFR 36 (L) >81 XB/JYN/8.29   BUN/Creatinine Ratio 12 12 - 28   Sodium 136 134 - 144 mmol/L   Potassium 4.9 3.5 - 5.2 mmol/L   Chloride 103 96 - 106 mmol/L   CO2 19 (L) 20 - 29 mmol/L   Calcium 9.0 8.7 - 10.3 mg/dL   Total Protein 7.0 6.0 - 8.5 g/dL   Albumin 4.1 3.8 - 4.8 g/dL   Globulin, Total 2.9 1.5 - 4.5 g/dL   Bilirubin Total 0.3 0.0 - 1.2 mg/dL   Alkaline Phosphatase 72 44 - 121 IU/L   AST 18 0 - 40 IU/L   ALT 10 0 - 32 IU/L  FANA Staining Patterns   Collection Time: 04/18/23  9:58 AM  Result Value Ref Range   Speckled Pattern 1:80    Note: Comment       Assessment & Plan:   Problem List Items Addressed This Visit       Cardiovascular and Mediastinum   Hypertension - Primary   Chronic. Improved from 150-160/80.  Continue with Losartan 25mg .  Cmp checked today due to high potassium in the past.  Follow up in 3 months.  Call sooner if concerns arise.  Relevant Orders   Comp Met (CMET)     Follow up plan: Return in about 3 months (around 08/20/2023) for HTN, HLD,  DM2 FU.

## 2023-05-22 NOTE — Assessment & Plan Note (Signed)
Chronic. Improved from 150-160/80.  Continue with Losartan 25mg .  Cmp checked today due to high potassium in the past.  Follow up in 3 months.  Call sooner if concerns arise.

## 2023-05-23 LAB — COMPREHENSIVE METABOLIC PANEL
ALT: 13 [IU]/L (ref 0–32)
AST: 18 [IU]/L (ref 0–40)
Albumin: 4 g/dL (ref 3.8–4.8)
Alkaline Phosphatase: 85 [IU]/L (ref 44–121)
BUN/Creatinine Ratio: 15 (ref 12–28)
BUN: 23 mg/dL (ref 8–27)
Bilirubin Total: <0.2 mg/dL (ref 0.0–1.2)
CO2: 21 mmol/L (ref 20–29)
Calcium: 9.4 mg/dL (ref 8.7–10.3)
Chloride: 104 mmol/L (ref 96–106)
Creatinine, Ser: 1.49 mg/dL — ABNORMAL HIGH (ref 0.57–1.00)
Globulin, Total: 3.3 g/dL (ref 1.5–4.5)
Glucose: 101 mg/dL — ABNORMAL HIGH (ref 70–99)
Potassium: 4.8 mmol/L (ref 3.5–5.2)
Sodium: 139 mmol/L (ref 134–144)
Total Protein: 7.3 g/dL (ref 6.0–8.5)
eGFR: 36 mL/min/{1.73_m2} — ABNORMAL LOW (ref >59)

## 2023-06-13 ENCOUNTER — Encounter: Payer: Self-pay | Admitting: Obstetrics and Gynecology

## 2023-06-13 ENCOUNTER — Ambulatory Visit (INDEPENDENT_AMBULATORY_CARE_PROVIDER_SITE_OTHER): Payer: Federal, State, Local not specified - PPO | Admitting: Obstetrics and Gynecology

## 2023-06-13 VITALS — BP 159/81 | HR 60 | Ht 62.0 in | Wt 154.5 lb

## 2023-06-13 DIAGNOSIS — Z01419 Encounter for gynecological examination (general) (routine) without abnormal findings: Secondary | ICD-10-CM | POA: Diagnosis not present

## 2023-06-13 DIAGNOSIS — L900 Lichen sclerosus et atrophicus: Secondary | ICD-10-CM

## 2023-06-13 DIAGNOSIS — N3941 Urge incontinence: Secondary | ICD-10-CM

## 2023-06-13 MED ORDER — CLOBETASOL PROPIONATE 0.05 % EX OINT: TOPICAL_OINTMENT | CUTANEOUS | 3 refills | Status: AC

## 2023-06-13 MED ORDER — TOLTERODINE TARTRATE ER 2 MG PO CP24: 2.0000 mg | ORAL_CAPSULE | Freq: Every day | ORAL | 11 refills | Status: AC

## 2023-06-13 NOTE — Progress Notes (Signed)
 Patient presents today for a yearly follow-up for lichen sclerous and urge incontinence. She reports continuing to use her medication correctly and doing well with these treatments. No concerns today.

## 2023-06-13 NOTE — Progress Notes (Signed)
 HPI:      Virginia West is a 78 y.o. G3P0003 who LMP was No LMP recorded. Patient has had a hysterectomy.  Subjective:   She presents today for her annual examination.  She has been using Detrol  LA for urge incontinence and it does help but it is not perfect.  She recently began using gabapentin  to help her sleep and she believes this gives her a deeper sleep and so that when she wakes up her bladder is more full and she has a significant urge to void.  It seems a possible compromise between trying to sleep and this urge and urge to urinate. She continues to use clobetasol  twice weekly for lichen sclerosus.  She says that as long she takes her medicine this is under control.  When she skips a several doses she notices vulvar burning.  Of significant note, patient has previously had a hysterectomy.    Hx: The following portions of the patient's history were reviewed and updated as appropriate:             She  has a past medical history of Asthma, Cancer of kidney (HCC), and Pancreatitis. She does not have any pertinent problems on file. She  has a past surgical history that includes Abdominal hysterectomy; Appendectomy; Cholecystectomy; and Colonoscopy with propofol  (N/A, 03/24/2021). Her family history includes Breast cancer (age of onset: 54) in her maternal aunt; Cancer in her brother and son; Depression in her son; Heart disease in her father and mother. She  reports that she has never smoked. She has been exposed to tobacco smoke. She has never used smokeless tobacco. She reports that she does not currently use alcohol. She reports that she does not use drugs. She has a current medication list which includes the following prescription(s): gabapentin , latanoprost, losartan , timolol, vitamin d  (ergocalciferol ), [START ON 06/15/2023] clobetasol  ointment, and tolterodine . She has no known allergies.       Review of Systems:  Review of Systems  Constitutional: Denied constitutional  symptoms, night sweats, recent illness, fatigue, fever, insomnia and weight loss.  Eyes: Denied eye symptoms, eye pain, photophobia, vision change and visual disturbance.  Ears/Nose/Throat/Neck: Denied ear, nose, throat or neck symptoms, hearing loss, nasal discharge, sinus congestion and sore throat.  Cardiovascular: Denied cardiovascular symptoms, arrhythmia, chest pain/pressure, edema, exercise intolerance, orthopnea and palpitations.  Respiratory: Denied pulmonary symptoms, asthma, pleuritic pain, productive sputum, cough, dyspnea and wheezing.  Gastrointestinal: Denied, gastro-esophageal reflux, melena, nausea and vomiting.  Genitourinary: See HPI for additional information.  Musculoskeletal: Denied musculoskeletal symptoms, stiffness, swelling, muscle weakness and myalgia.  Dermatologic: Denied dermatology symptoms, rash and scar.  Neurologic: Denied neurology symptoms, dizziness, headache, neck pain and syncope.  Psychiatric: Denied psychiatric symptoms, anxiety and depression.  Endocrine: Denied endocrine symptoms including hot flashes and night sweats.   Meds:   Current Outpatient Medications on File Prior to Visit  Medication Sig Dispense Refill   gabapentin  (NEURONTIN ) 300 MG capsule Take 1 capsule (300 mg total) by mouth at bedtime. 90 capsule 1   latanoprost (XALATAN) 0.005 % ophthalmic solution SMARTSIG:1 In Eye(s) Every Night     losartan  (COZAAR ) 25 MG tablet Take 1 tablet (25 mg total) by mouth daily. 90 tablet 1   timolol (TIMOPTIC) 0.5 % ophthalmic solution SMARTSIG:In Eye(s)     Vitamin D , Ergocalciferol , (DRISDOL ) 1.25 MG (50000 UNIT) CAPS capsule Take 1 capsule (50,000 Units total) by mouth every 7 (seven) days. 12 capsule 0   No current facility-administered medications on file prior  to visit.     Objective:     Vitals:   06/13/23 0911  BP: (!) 159/81  Pulse: 60    Filed Weights   06/13/23 0911  Weight: 154 lb 8 oz (70.1 kg)              Patient has  deferred examination today as she is asymptomatic.  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   Hip pain 11/02/2021   Pain of right hip 10/27/2021   Rash 10/27/2021   Bereavement 07/16/2021   Severe episode of recurrent major depressive disorder, without psychotic features (HCC) 07/16/2021   Caregiver stress 04/27/2021   Colon cancer screening    Urge incontinence of urine 03/16/2021   PTSD (post-traumatic stress disorder) 02/23/2021   Vitamin D  deficiency 02/23/2021   Neuropathy 02/23/2021   B12 deficiency 02/23/2021   Screen for colon cancer 02/23/2021   Screening for osteoporosis 02/23/2021   Weight loss 02/23/2021   Encounter for screening for malignant neoplasm of breast 02/23/2021   Need for influenza vaccination 02/23/2021   Glaucoma 02/23/2021   Screening for cervical cancer 02/23/2021     1. Well woman exam with routine gynecological exam   2. Lichen sclerosus   3. Urge incontinence        Plan:            1.  Discussed use of Detrol  LA.  As patient drinks all the way up until bedtime we have discussed the possibility of beginning fluid restriction 2 hours prior to going to bed.  Perhaps this will help with her nightly urge incontinence.  We have also discussed the possibility of a change or adjustment to her gabapentin  regimen through her primary care doctor.  2.  She will continue clobetasol  for lichen sclerosus. Orders No orders of the defined types were placed in this encounter.    Meds ordered this encounter  Medications   clobetasol  ointment (TEMOVATE )  0.05 %    Sig: Apply topically 2 (two) times a week.    Dispense:  60 g    Refill:  3   tolterodine  (DETROL  LA) 2 MG 24 hr capsule    Sig: Take 1 capsule (2 mg total) by mouth daily.    Dispense:  30 capsule    Refill:  11          F/U  Return in about 1 year (around 06/12/2024) for Annual Physical.  Alm DOROTHA Sar, M.D. 06/13/2023 9:25 AM

## 2023-07-01 ENCOUNTER — Other Ambulatory Visit: Payer: Self-pay | Admitting: Nurse Practitioner

## 2023-07-03 NOTE — Telephone Encounter (Signed)
Requested medications are due for refill today.  yes  Requested medications are on the active medications list.  yes  Last refill. 03/08/2023 #12 0 rf  Future visit scheduled.   no  Notes to clinic.  Refill not delegated.     Requested Prescriptions  Pending Prescriptions Disp Refills   Vitamin D, Ergocalciferol, (DRISDOL) 1.25 MG (50000 UNIT) CAPS capsule [Pharmacy Med Name: VITAMIN D2 1.25MG (50,000 UNIT)] 12 capsule 0    Sig: Take 1 capsule (50,000 Units total) by mouth every 7 (seven) days.     Endocrinology:  Vitamins - Vitamin D Supplementation 2 Failed - 07/03/2023  5:31 PM      Failed - Manual Review: Route requests for 50,000 IU strength to the provider      Failed - Vitamin D in normal range and within 360 days    Vitamin D2 1, 25 (OH)2  Date Value Ref Range Status  02/23/2021 <10 pg/mL Final    Comment:    This test was developed and its performance characteristics determined by [facility]. It has not been cleared or approved by the Food and Drug Administration.    Vitamin D3 1, 25 (OH)2  Date Value Ref Range Status  02/23/2021 32 pg/mL Final    Comment:    This test was developed and its performance characteristics determined by [facility]. It has not been cleared or approved by the Food and Drug Administration.    Vitamin D 1, 25 (OH)2 Total  Date Value Ref Range Status  02/23/2021 35 pg/mL Final    Comment:    Reference Range: Adults: 21 - 65    Vit D, 25-Hydroxy  Date Value Ref Range Status  03/07/2023 22.8 (L) 30.0 - 100.0 ng/mL Final    Comment:    Vitamin D deficiency has been defined by the Institute of Medicine and an Endocrine Society practice guideline as a level of serum 25-OH vitamin D less than 20 ng/mL (1,2). The Endocrine Society went on to further define vitamin D insufficiency as a level between 21 and 29 ng/mL (2). 1. IOM (Institute of Medicine). 2010. Dietary reference    intakes for calcium and D. [place]: The    [employer]. 2. Holick MF, Binkley [place], Bischoff-Ferrari HA, et al.    Evaluation, treatment, and prevention of vitamin D    deficiency: an Endocrine Society clinical practice    guideline. JCEM. 2011 Jul; 96(7):1911-30.          Passed - Ca in normal range and within 360 days    Calcium  Date Value Ref Range Status  05/22/2023 9.4 8.7 - 10.3 mg/dL Final         Passed - Valid encounter within last 12 months    Recent Outpatient Visits           1 month ago Primary hypertension   [place] [facility] Larae Grooms, NP   2 months ago Primary hypertension   [place] [facility] Larae Grooms, NP   3 months ago Stage 3a chronic kidney disease [facility])   [place] [facility] Larae Grooms, NP   6 months ago Neck pain   [place] [facility] Mecum, Oswaldo Conroy, PA-C   9 months ago Stage 3b chronic kidney disease [facility])    [place] [facility] Mecum, Oswaldo Conroy, PA-C

## 2023-10-15 ENCOUNTER — Other Ambulatory Visit: Payer: Self-pay | Admitting: Nurse Practitioner

## 2023-10-17 NOTE — Telephone Encounter (Signed)
 Requested medication (s) are due for refill today: yes  Requested medication (s) are on the active medication list: yes  Last refill:  04/11/23  # 90 1 RF  Future visit scheduled: no  Notes to clinic: sent pt message to call and make appt for labs and appt   Requested Prescriptions  Pending Prescriptions Disp Refills   losartan  (COZAAR ) 25 MG tablet [Pharmacy Med Name: LOSARTAN  POTASSIUM 25 MG TAB] 90 tablet 1    Sig: Take 1 tablet (25 mg total) by mouth daily.     Cardiovascular:  Angiotensin Receptor Blockers Failed - 10/17/2023  2:55 PM      Failed - Cr in normal range and within 180 days    Creatinine, Ser  Date Value Ref Range Status  05/22/2023 1.49 (H) 0.57 - 1.00 mg/dL Final         Failed - Last BP in normal range    BP Readings from Last 1 Encounters:  06/13/23 (!) 159/81         Failed - Valid encounter within last 6 months    Recent Outpatient Visits   None            Passed - K in normal range and within 180 days    Potassium  Date Value Ref Range Status  05/22/2023 4.8 3.5 - 5.2 mmol/L Final         Passed - Patient is not pregnant

## 2023-10-17 NOTE — Telephone Encounter (Signed)
 Patient is overdue for follow up appointment, was due in March. Please call to schedule and then route to provider for refill.

## 2023-10-31 ENCOUNTER — Ambulatory Visit: Admitting: Nurse Practitioner

## 2023-10-31 ENCOUNTER — Encounter: Payer: Self-pay | Admitting: Nurse Practitioner

## 2023-10-31 VITALS — BP 134/67 | HR 61 | Ht 62.0 in | Wt 150.0 lb

## 2023-10-31 DIAGNOSIS — E785 Hyperlipidemia, unspecified: Secondary | ICD-10-CM

## 2023-10-31 DIAGNOSIS — F332 Major depressive disorder, recurrent severe without psychotic features: Secondary | ICD-10-CM

## 2023-10-31 DIAGNOSIS — M542 Cervicalgia: Secondary | ICD-10-CM

## 2023-10-31 DIAGNOSIS — N1831 Chronic kidney disease, stage 3a: Secondary | ICD-10-CM

## 2023-10-31 DIAGNOSIS — I10 Essential (primary) hypertension: Secondary | ICD-10-CM | POA: Diagnosis not present

## 2023-10-31 MED ORDER — LOSARTAN POTASSIUM 25 MG PO TABS: 25.0000 mg | ORAL_TABLET | Freq: Every day | ORAL | 1 refills | Status: DC

## 2023-10-31 MED ORDER — GABAPENTIN 400 MG PO CAPS: 400.0000 mg | ORAL_CAPSULE | Freq: Every day | ORAL | 1 refills | Status: DC

## 2023-10-31 NOTE — Assessment & Plan Note (Signed)
 Chronic. Controlled.  Continue with Losartan  25mg .  Followed by Nephrology.  Reviewed recent labs.  Follow up in 3 months.  Call sooner if concerns arise.

## 2023-10-31 NOTE — Assessment & Plan Note (Signed)
 Chronic.  Ongoing pain.  Will increase dose of Gabapentin  to 400mg  nightly.  Follow up in 6 months. Call sooner if concerns arise.

## 2023-10-31 NOTE — Assessment & Plan Note (Signed)
 Chronic.  Controlled.  Continue with current medication regimen.  Followed by Nephrology.  Reviewed most recent note.  Labs ordered today.  Return to clinic in 6 months for reevaluation.  Call sooner if concerns arise.

## 2023-10-31 NOTE — Assessment & Plan Note (Signed)
 Chronic.  Ongoing.  No longer seeing psychiatry.  Not taking any medications.  Feels like she is doing well without them.  Follow up in 6 months.  Call sooner if concerns arise.

## 2023-10-31 NOTE — Assessment & Plan Note (Addendum)
Chronic.  Controlled.  Continue with current medication regimen.  Return to clinic in 6 months for reevaluation.  Call sooner if concerns arise.  ? ?

## 2023-10-31 NOTE — Progress Notes (Signed)
 BP 134/67   Pulse 61   Ht 5\' 2"  (1.575 m)   Wt 150 lb (68 kg)   BMI 27.44 kg/m    Subjective:    Patient ID: Virginia West, female    DOB: Aug 12, 1945, 78 y.o.   MRN: [id number]  HPI: Virginia West is a 78 y.o. female  Chief Complaint  Patient presents with   Medical Management of Chronic Issues   Neck Pain   NECK PAIN She reports continued pain on both sides of her neck and rotation is tighter with turning to the right  She has been seen by multiple providers including Ortho She has been diagnosed with herniated disc and was given injections but reports minimal improvement  She has also tried to get back in the pool and reports improvement after several exercise session in pool. States she was completely off Tylenol for 2 days before she started PT  She has been seeing physical therapy - with [facility] and states after doing the exercises they provided she is having constant pain again  Pain is worse at night.  Feels very achy.  After a couple of hours she loosens up but is still achy and hurting.   Patient states her neck pain is 90% back.  Physical therapy didn't really help due to having a herniated disc.  She is feeling a lot of weakness in her hands.  Lifting a gallon of milk is very difficult.     DEPRESSION She reports ongoing caregiver stress  This has been a hectic month.  Her son had bipass surgery and a close friend of hers passed away.  She is no longer seeing Dr. Tere Felts.  Outside of the recent stress she felt liks he was doing okay.  Not currently taking any medications.  Denies SI.   Flowsheet Row Office Visit from 10/31/2023 in [facility]  PHQ-9 Total Score 7         10/31/2023    1:19 PM 05/22/2023    2:48 PM 04/11/2023   10:44 AM 03/07/2023   11:15 AM  GAD 7 : Generalized Anxiety Score  Nervous, Anxious, on Edge 1 1 1 1   Control/stop worrying 1 1 1 1   Worry too much - different things 1 1 1 1   Trouble relaxing 2 2 1 1    Restless 1 2 1 1   Easily annoyed or irritable 1 3 2 2   Afraid - awful might happen 2 1 1 2   Total GAD 7 Score 9 11 8 9   Anxiety Difficulty Somewhat difficult   Somewhat difficult   CHRONIC KIDNEY DISEASE CKD status: controlled Medications renally dose: yes Previous renal evaluation: yes Pneumovax:  Up to Date Influenza Vaccine:  Up to Date  Followed by Dr. Defoor for Rheumatology.  Now on Methotrexate and doing okay with the medication.     Relevant past medical, surgical, family and social history reviewed and updated as indicated. Interim medical history since our last visit reviewed. Allergies and medications reviewed and updated.  Review of Systems  Eyes:  Negative for visual disturbance.  Respiratory:  Negative for cough, chest tightness and shortness of breath.   Cardiovascular:  Negative for chest pain, palpitations and leg swelling.  Musculoskeletal:  Positive for arthralgias, myalgias and neck pain.  Neurological:  Negative for dizziness and headaches.  Psychiatric/Behavioral:  Positive for dysphoric mood. The patient is nervous/anxious.     Per HPI unless specifically indicated above     Objective:  BP 134/67   Pulse 61   Ht 5\' 2"  (1.575 m)   Wt 150 lb (68 kg)   BMI 27.44 kg/m   Wt Readings from Last 3 Encounters:  10/31/23 150 lb (68 kg)  06/13/23 154 lb 8 oz (70.1 kg)  05/22/23 154 lb (69.9 kg)    Physical Exam Vitals and nursing note reviewed.  Constitutional:      General: She is not in acute distress.    Appearance: Normal appearance. She is normal weight. She is not ill-appearing, toxic-appearing or diaphoretic.  HENT:     Head: Normocephalic.     Right Ear: External ear normal.     Left Ear: External ear normal.     Nose: Nose normal.     Mouth/Throat:     Mouth: Mucous membranes are moist.     Pharynx: Oropharynx is clear.  Eyes:     General:        Right eye: No discharge.        Left eye: No discharge.     Extraocular Movements:  Extraocular movements intact.     Conjunctiva/sclera: Conjunctivae normal.     Pupils: Pupils are equal, round, and reactive to light.  Cardiovascular:     Rate and Rhythm: Normal rate and regular rhythm.     Heart sounds: No murmur heard. Pulmonary:     Effort: Pulmonary effort is normal. No respiratory distress.     Breath sounds: Normal breath sounds. No wheezing or rales.  Musculoskeletal:     Cervical back: Normal range of motion and neck supple.  Skin:    General: Skin is warm and dry.     Capillary Refill: Capillary refill takes less than 2 seconds.  Neurological:     General: No focal deficit present.     Mental Status: She is alert and oriented to person, place, and time. Mental status is at baseline.  Psychiatric:        Mood and Affect: Mood normal.        Behavior: Behavior normal.        Thought Content: Thought content normal.        Judgment: Judgment normal.     Results for orders placed or performed in visit on 05/22/23  Comp Met (CMET)   Collection Time: 05/22/23  2:54 PM  Result Value Ref Range   Glucose 101 (H) 70 - 99 mg/dL   BUN 23 8 - 27 mg/dL   Creatinine, Ser 1.61 (H) 0.57 - 1.00 mg/dL   eGFR 36 (L) >09 UE/AVW/0.98   BUN/Creatinine Ratio 15 12 - 28   Sodium 139 134 - 144 mmol/L   Potassium 4.8 3.5 - 5.2 mmol/L   Chloride 104 96 - 106 mmol/L   CO2 21 20 - 29 mmol/L   Calcium 9.4 8.7 - 10.3 mg/dL   Total Protein 7.3 6.0 - 8.5 g/dL   Albumin 4.0 3.8 - 4.8 g/dL   Globulin, Total 3.3 1.5 - 4.5 g/dL   Bilirubin Total <1.1 0.0 - 1.2 mg/dL   Alkaline Phosphatase 85 44 - 121 IU/L   AST 18 0 - 40 IU/L   ALT 13 0 - 32 IU/L      Assessment & Plan:   Problem List Items Addressed This Visit       Cardiovascular and Mediastinum   Hypertension   Chronic. Controlled.  Continue with Losartan  25mg .  Followed by Nephrology.  Reviewed recent labs.  Follow up in 3 months.  Call  sooner if concerns arise.       Relevant Medications   losartan  (COZAAR ) 25 MG  tablet     Genitourinary   CKD (chronic kidney disease), stage III (HCC)   Chronic.  Controlled.  Continue with current medication regimen.  Followed by Nephrology.  Reviewed most recent note.  Labs ordered today.  Return to clinic in 6 months for reevaluation.  Call sooner if concerns arise.          Other   Severe episode of recurrent major depressive disorder, without psychotic features (HCC) - Primary   Chronic.  Ongoing.  No longer seeing psychiatry.  Not taking any medications.  Feels like she is doing well without them.  Follow up in 6 months.  Call sooner if concerns arise.       Hyperlipidemia   Chronic.  Controlled.  Continue with current medication regimen.  Return to clinic in 6 months for reevaluation.  Call sooner if concerns arise.        Relevant Medications   losartan  (COZAAR ) 25 MG tablet   Neck pain   Chronic.  Ongoing pain.  Will increase dose of Gabapentin  to 400mg  nightly.  Follow up in 6 months. Call sooner if concerns arise.          Follow up plan: Return in about 6 months (around 05/01/2024) for Physical and Fasting labs.

## 2024-02-07 ENCOUNTER — Ambulatory Visit: Payer: Self-pay | Admitting: Nurse Practitioner

## 2024-02-07 ENCOUNTER — Ambulatory Visit
Admission: RE | Admit: 2024-02-07 | Discharge: 2024-02-07 | Disposition: A | Discharge status: Home / Self Care | Source: Ambulatory Visit | Attending: Nurse Practitioner | Admitting: Nurse Practitioner

## 2024-02-07 ENCOUNTER — Encounter: Payer: Self-pay | Admitting: Nurse Practitioner

## 2024-02-07 ENCOUNTER — Ambulatory Visit: Admitting: Nurse Practitioner

## 2024-02-07 VITALS — BP 110/68 | HR 81 | Temp 97.8°F | Ht 62.0 in | Wt 151.4 lb

## 2024-02-07 DIAGNOSIS — R5383 Other fatigue: Secondary | ICD-10-CM

## 2024-02-07 DIAGNOSIS — R0602 Shortness of breath: Secondary | ICD-10-CM

## 2024-02-07 MED ORDER — METHYLPREDNISOLONE 4 MG PO TBPK: ORAL_TABLET | ORAL | 0 refills | Status: DC

## 2024-02-07 MED ORDER — AMOXICILLIN-POT CLAVULANATE 875-125 MG PO TABS: 1.0000 | ORAL_TABLET | Freq: Two times a day (BID) | ORAL | 0 refills | Status: DC

## 2024-02-07 NOTE — Progress Notes (Signed)
 BP 110/68   Pulse 81   Temp 97.8 F (36.6 C) (Oral)   Ht 5' 2 (1.575 m)   Wt 151 lb 6.4 oz (68.7 kg)   SpO2 91%   BMI 27.69 kg/m    Subjective:    Patient ID: Virginia West, female    DOB: 09/21/1945, 78 y.o.   MRN: [id number]  HPI: Virginia West is a 78 y.o. female  Chief Complaint  Patient presents with   Cough    Patient states she has been experiencing fatigue, cough, and a lot of sneezing for the last 3 weeks. States she has been getting really tired after doing housework and has to lay down for periods of time.    UPPER RESPIRATORY TRACT INFECTION Worst symptom: Patient states her symptoms started about 3 weeks ago.  Started with sneezing, coughing, feeling bad and then about  a week ago she wasn't able to do things around the house for 15 mins.  Feels winded, nausea, light headed.  States she wasn't doing anythign strenuous and these are normal activities for her.  She isn't getting any better.  Has gone through a box of mucinex which does help her symptoms but then they come right back.  Fever: no Cough: yes Shortness of breath: yes Wheezing: no Chest pain: no Chest tightness: yes Chest congestion: no Nasal congestion: yes Runny nose: yes Post nasal drip: yes Sneezing: yes Sore throat: no Swollen glands: no Sinus pressure: no Headache: yes Face pain: no Toothache: no Ear pain: no bilateral Ear pressure: no bilateral Eyes red/itching:no Eye drainage/crusting: no  Vomiting: no Rash: no Fatigue: yes Sick contacts: no Strep contacts: no  Context: worse Recurrent sinusitis: no Relief with OTC cold/cough medications: no  Treatments attempted: mucinex   Relevant past medical, surgical, family and social history reviewed and updated as indicated. Interim medical history since our last visit reviewed. Allergies and medications reviewed and updated.  Review of Systems  Constitutional:  Positive for fatigue. Negative for fever.  HENT:  Positive for  congestion, postnasal drip, rhinorrhea and sneezing. Negative for dental problem, ear pain, sinus pressure, sinus pain and sore throat.   Respiratory:  Positive for cough, shortness of breath and wheezing.   Cardiovascular:  Negative for chest pain.  Gastrointestinal:  Negative for vomiting.  Skin:  Negative for rash.  Neurological:  Positive for dizziness, light-headedness and headaches.    Per HPI unless specifically indicated above     Objective:    BP 110/68   Pulse 81   Temp 97.8 F (36.6 C) (Oral)   Ht 5' 2 (1.575 m)   Wt 151 lb 6.4 oz (68.7 kg)   SpO2 91%   BMI 27.69 kg/m   Wt Readings from Last 3 Encounters:  02/07/24 151 lb 6.4 oz (68.7 kg)  10/31/23 150 lb (68 kg)  06/13/23 154 lb 8 oz (70.1 kg)    Physical Exam Vitals and nursing note reviewed.  Constitutional:      General: She is not in acute distress.    Appearance: Normal appearance. She is normal weight. She is not ill-appearing, toxic-appearing or diaphoretic.  HENT:     Head: Normocephalic.     Right Ear: External ear normal.     Left Ear: External ear normal.     Nose: Congestion and rhinorrhea present.     Mouth/Throat:     Mouth: Mucous membranes are moist.     Pharynx: Oropharynx is clear. Posterior oropharyngeal erythema present. No oropharyngeal exudate.  Eyes:     General:        Right eye: No discharge.        Left eye: No discharge.     Extraocular Movements: Extraocular movements intact.     Conjunctiva/sclera: Conjunctivae normal.     Pupils: Pupils are equal, round, and reactive to light.  Cardiovascular:     Rate and Rhythm: Normal rate and regular rhythm.     Heart sounds: No murmur heard. Pulmonary:     Effort: Pulmonary effort is normal. No respiratory distress.     Breath sounds: Wheezing present. No rales.  Musculoskeletal:     Cervical back: Normal range of motion and neck supple.  Skin:    General: Skin is warm and dry.     Capillary Refill: Capillary refill takes less  than 2 seconds.  Neurological:     General: No focal deficit present.     Mental Status: She is alert and oriented to person, place, and time. Mental status is at baseline.  Psychiatric:        Mood and Affect: Mood normal.        Behavior: Behavior normal.        Thought Content: Thought content normal.        Judgment: Judgment normal.     Results for orders placed or performed in visit on 05/22/23  Comp Met (CMET)   Collection Time: 05/22/23  2:54 PM  Result Value Ref Range   Glucose 101 (H) 70 - 99 mg/dL   BUN 23 8 - 27 mg/dL   Creatinine, Ser 8.50 (H) 0.57 - 1.00 mg/dL   eGFR 36 (L) >40 fO/fpw/8.26   BUN/Creatinine Ratio 15 12 - 28   Sodium 139 134 - 144 mmol/L   Potassium 4.8 3.5 - 5.2 mmol/L   Chloride 104 96 - 106 mmol/L   CO2 21 20 - 29 mmol/L   Calcium 9.4 8.7 - 10.3 mg/dL   Total Protein 7.3 6.0 - 8.5 g/dL   Albumin 4.0 3.8 - 4.8 g/dL   Globulin, Total 3.3 1.5 - 4.5 g/dL   Bilirubin Total <9.7 0.0 - 1.2 mg/dL   Alkaline Phosphatase 85 44 - 121 IU/L   AST 18 0 - 40 IU/L   ALT 13 0 - 32 IU/L      Assessment & Plan:   Problem List Items Addressed This Visit   None Visit Diagnoses       Shortness of breath    -  Primary   Some wheezing on exam.  Will check labs and rule out anemia due to symptoms. Started Augumentin. Chest xray ordered to Rule out Pneumonia.   Relevant Orders   DG Chest 2 View     Other fatigue       Relevant Orders   Comp Met (CMET)   Anemia Profile B   TSH   T4, free   DG Chest 2 View        Follow up plan: No follow-ups on file.

## 2024-02-08 ENCOUNTER — Ambulatory Visit: Payer: Self-pay | Admitting: *Deleted

## 2024-02-08 ENCOUNTER — Telehealth: Payer: Self-pay | Admitting: Nurse Practitioner

## 2024-02-08 LAB — COMPREHENSIVE METABOLIC PANEL WITH GFR
ALT: 13 IU/L (ref 0–32)
AST: 26 IU/L (ref 0–40)
Albumin: 4.1 g/dL (ref 3.8–4.8)
Alkaline Phosphatase: 89 IU/L (ref 44–121)
BUN/Creatinine Ratio: 17 (ref 12–28)
BUN: 25 mg/dL (ref 8–27)
Bilirubin Total: 0.3 mg/dL (ref 0.0–1.2)
CO2: 20 mmol/L (ref 20–29)
Calcium: 9.5 mg/dL (ref 8.7–10.3)
Chloride: 102 mmol/L (ref 96–106)
Creatinine, Ser: 1.47 mg/dL — ABNORMAL HIGH (ref 0.57–1.00)
Globulin, Total: 3.7 g/dL (ref 1.5–4.5)
Glucose: 114 mg/dL — ABNORMAL HIGH (ref 70–99)
Potassium: 5.6 mmol/L — ABNORMAL HIGH (ref 3.5–5.2)
Sodium: 138 mmol/L (ref 134–144)
Total Protein: 7.8 g/dL (ref 6.0–8.5)
eGFR: 37 mL/min/1.73 — ABNORMAL LOW (ref >59)

## 2024-02-08 LAB — ANEMIA PROFILE B
Basophils Absolute: 0.1 x10E3/uL (ref 0.0–0.2)
Basos: 1 %
EOS (ABSOLUTE): 0.7 x10E3/uL — ABNORMAL HIGH (ref 0.0–0.4)
Eos: 7 %
Ferritin: 365 ng/mL — ABNORMAL HIGH (ref 15–150)
Folate: >20 ng/mL (ref >3.0)
Hematocrit: 41.8 % (ref 34.0–46.6)
Hemoglobin: 13.5 g/dL (ref 11.1–15.9)
Immature Grans (Abs): 0 x10E3/uL (ref 0.0–0.1)
Immature Granulocytes: 0 %
Iron Saturation: 31 % (ref 15–55)
Iron: 70 ug/dL (ref 27–139)
Lymphocytes Absolute: 1.6 x10E3/uL (ref 0.7–3.1)
Lymphs: 15 %
MCH: 30.3 pg (ref 26.6–33.0)
MCHC: 32.3 g/dL (ref 31.5–35.7)
MCV: 94 fL (ref 79–97)
Monocytes Absolute: 0.9 x10E3/uL (ref 0.1–0.9)
Monocytes: 8 %
Neutrophils Absolute: 7.1 x10E3/uL — ABNORMAL HIGH (ref 1.4–7.0)
Neutrophils: 69 %
Platelets: 365 x10E3/uL (ref 150–450)
RBC: 4.45 x10E6/uL (ref 3.77–5.28)
RDW: 13.3 % (ref 11.7–15.4)
Retic Ct Pct: 1.4 % (ref 0.6–2.6)
Total Iron Binding Capacity: 228 ug/dL — ABNORMAL LOW (ref 250–450)
UIBC: 158 ug/dL (ref 118–369)
Vitamin B-12: 392 pg/mL (ref 232–1245)
WBC: 10.3 x10E3/uL (ref 3.4–10.8)

## 2024-02-08 LAB — T4, FREE: Free T4: 1.42 ng/dL (ref 0.82–1.77)

## 2024-02-08 LAB — TSH: TSH: 0.533 u[IU]/mL (ref 0.450–4.500)

## 2024-02-08 MED ORDER — DOXYCYCLINE HYCLATE 100 MG PO TABS: 100.0000 mg | ORAL_TABLET | Freq: Two times a day (BID) | ORAL | 0 refills | Status: DC

## 2024-02-08 NOTE — Telephone Encounter (Signed)
 Called and notified patient of Virginia West's message. Patient asked if she could be prescribed something for the nausea and vomiting as well.

## 2024-02-08 NOTE — Telephone Encounter (Addendum)
 Copied from CRM [id number]. Topic: Clinical - Red Word Triage >> Feb 08, 2024  1:17 PM Antwanette L wrote: Red Word that prompted transfer to Nurse Triage: Pt is reporting uncontrollable vomiting. Pt is currently taking amoxicillin -clavulanate (AUGMENTIN ) 875-125 MG tablet. Pt wants to know what should she do or can her provider call in some other meds. Reason for Disposition  [1] Constant abdominal pain AND [2] present > 2 hours    Started vomiting after starting Augmentin  on 9/10 for chest congestion and coughing.  Saw Darice Petty, NP on 9/10.  Answer Assessment - Initial Assessment Questions 1. VOMITING SEVERITY: How many times have you vomited in the past 24 hours?      I have a history of pancreatitis last time being 12 years ago.   I got the Augmentin  filled yesterday evening.   Darice Petty, NP prescribed it for me.   I took it and within 15 min I was really sick.   I was vomiting every 4 min.   I'm dry heaving now.     This went on for 2 1/2 hours during the night.   I'm taking sips of water to try and prevent dehydration.   I have really bad congestion in my chest and cough for 3 weeks. That's what the Augmentin  is for.   I got a chest x ray yesterday.   Darice Petty, NP started me on the Augmentin  and a steroid pack.   I've not started the steroid pack yet.    I'm worried this might flare up my pancreas.  2. ONSET: When did the vomiting begin?      It began about 6:30 PM after I took my first dose of Augmentin .   3. FLUIDS: What fluids or food have you vomited up today? Have you been able to keep any fluids down?     Sipping fluids, just water.    I can't keep anything down.   I'm worried about my pancreas.   No lightheadedness or dizziness.   4. ABDOMEN PAIN: Are your having any abdomen pain? If Yes : How bad is it and what does it feel like? (e.g., crampy, dull, intermittent, constant)      I'm having pain in my stomach before I vomit.  It's projectile vomiting.     5. DIARRHEA: Is there any diarrhea? If Yes, ask: How many times today?      No 6. CONTACTS: Is there anyone else in the family with the same symptoms?      Not asked 7. CAUSE: What do you think is causing your vomiting?     The Augmentin  8. HYDRATION STATUS: Any signs of dehydration? (e.g., dry mouth [not only dry lips], too weak to stand) When did you last urinate?     Denies being lightheaded or dizzy.   Taking sips of water.  9. OTHER SYMPTOMS: Do you have any other symptoms? (e.g., fever, headache, vertigo, vomiting blood or coffee grounds, recent head injury)     Just the chest congestion and coughing that she is being treated for. 10. PREGNANCY: Is there any chance you are pregnant? When was your last menstrual period?       N/A due to age  Protocols used: Vomiting-A-AH FYI Only or Action Required?: Action required by provider: clinical question for provider and update on patient condition.  Patient was last seen in primary care on 02/07/2024 by Petty Darice, NP.  Called Nurse Triage reporting Vomiting. Took first dose of Augmentin  and  within min. Started vomiting.  Been vomiting projectile vomit.   She has a history of pancreatitis and is worried about it flaring up with all this vomiting.   Unable to keep anything down except sips of water.     Symptoms began yesterday. She took her first dose around 6:30 PM yesterday and within min. Started vomiting.     Interventions attempted: Rest, hydration, or home remedies.Sips of water  Symptoms are: rapidly worsening.  Triage Disposition: Call PCP Now Seeking advice regarding the Augmentin  and vomiting it is causing.   Was prescribed for 3 weeks of cough and chest congestion.    Patient/caregiver understands and will follow disposition?: Yes I called into the practice and spoke with Iris.   She asked that I send the triage notes over and they will get it to the clinical staff.  I send my notes high priority.

## 2024-02-08 NOTE — Telephone Encounter (Signed)
 Doxycyline sent in. Please see other encounter.

## 2024-02-08 NOTE — Telephone Encounter (Signed)
 Medication changed to doxycyline.

## 2024-02-09 ENCOUNTER — Telehealth: Payer: Self-pay | Admitting: Nurse Practitioner

## 2024-02-09 MED ORDER — ONDANSETRON HCL 4 MG PO TABS: 4.0000 mg | ORAL_TABLET | Freq: Three times a day (TID) | ORAL | 0 refills | Status: AC | PRN

## 2024-02-09 NOTE — Telephone Encounter (Signed)
 Zofran  sent the pharmacy.

## 2024-02-09 NOTE — Telephone Encounter (Signed)
 Zofran  sent to the pharmacy. Please see previous encounter.

## 2024-02-09 NOTE — Telephone Encounter (Signed)
 Noted

## 2024-05-02 ENCOUNTER — Encounter: Payer: Self-pay | Admitting: Nurse Practitioner

## 2024-05-02 ENCOUNTER — Ambulatory Visit: Admitting: Nurse Practitioner

## 2024-05-02 VITALS — BP 154/65 | HR 56 | Temp 97.8°F | Ht 63.78 in | Wt 137.2 lb

## 2024-05-02 DIAGNOSIS — Z0000 Encounter for general adult medical examination without abnormal findings: Secondary | ICD-10-CM

## 2024-05-02 DIAGNOSIS — Z23 Encounter for immunization: Secondary | ICD-10-CM | POA: Diagnosis not present

## 2024-05-02 DIAGNOSIS — R052 Subacute cough: Secondary | ICD-10-CM | POA: Diagnosis not present

## 2024-05-02 DIAGNOSIS — E785 Hyperlipidemia, unspecified: Secondary | ICD-10-CM | POA: Diagnosis not present

## 2024-05-02 DIAGNOSIS — I10 Essential (primary) hypertension: Secondary | ICD-10-CM

## 2024-05-02 DIAGNOSIS — N1831 Chronic kidney disease, stage 3a: Secondary | ICD-10-CM | POA: Diagnosis not present

## 2024-05-02 MED ORDER — FLUTICASONE PROPIONATE 50 MCG/ACT NA SUSP: 2.0000 | Freq: Every day | NASAL | 6 refills | Status: AC

## 2024-05-02 NOTE — Assessment & Plan Note (Signed)
Chronic.  Controlled.  Continue with current medication regimen.  Return to clinic in 6 months for reevaluation.  Call sooner if concerns arise.  ? ?

## 2024-05-02 NOTE — Assessment & Plan Note (Signed)
 Chronic.  Controlled.  Continue with current medication regimen.  Need to get blood pressure down.  Followed by Dr. Marcelino.  Labs ordered today.  Return to clinic in 6 months for reevaluation.  Call sooner if concerns arise.

## 2024-05-02 NOTE — Progress Notes (Signed)
 BP (!) 154/65 (BP Location: Right Arm, Cuff Size: Normal)   Pulse (!) 56   Temp 97.8 F (36.6 C) (Oral)   Ht 5' 3.78 (1.62 m)   Wt 137 lb 3.2 oz (62.2 kg)   SpO2 99%   BMI 23.71 kg/m    Subjective:    Patient ID: Virginia West, female    DOB: [birth year], 78 y.o.   MRN: [id number]  HPI: Virginia West is a 78 y.o. female presenting on 05/02/2024 for comprehensive medical examination. Current medical complaints include:Flu and COVID vaccines  She currently lives with: Menopausal Symptoms: no  HYPERTENSION with Chronic Kidney Disease Hypertension status: controlled  Satisfied with current treatment? no Duration of hypertension: chronic BP monitoring frequency:  daily BP range: 135/85 BP medication side effects:  no Medication compliance: excellent compliance Previous BP meds:losartan  (cozaar ) Aspirin: no Recurrent headaches: no Visual changes: no Palpitations: no Dyspnea: no Chest pain: no Lower extremity edema: no Dizzy/lightheaded: no Patient states she still has the cough from Pneumonia in September.  Makes her hurt in her back.  When she goes into a cough spell it will then make her go into a sneezing. She has a lot of congestion in her nose.   CHRONIC KIDNEY DISEASE CKD status: stable Medications renally dose: yes Previous renal evaluation: yes Pneumovax:  Up to Date Influenza Vaccine:  Up to Date   DEPRESSION She reports ongoing caregiver stress  She is sad this time of year due to losing her son and the holidays.   Depression Screen done today and results listed below:     05/02/2024    9:52 AM 02/07/2024   11:21 AM 10/31/2023    1:19 PM 05/22/2023    2:48 PM 04/11/2023   10:44 AM  Depression screen PHQ 2/9  Decreased Interest 1 1 1 1 1   Down, Depressed, Hopeless 1 1 1 1 1   PHQ - 2 Score 2 2 2 2 2   Altered sleeping 0 1 2 1 1   Tired, decreased energy 2 3 2 2 1   Change in appetite 0 3 0 0 0  Feeling bad or failure about yourself  1 1 1 1 2   Trouble  concentrating 1 1 0 0 0  Moving slowly or fidgety/restless 0 0 0 0 0  Suicidal thoughts 1 0 0 0 1  PHQ-9 Score 7 11  7  6  7    Difficult doing work/chores Somewhat difficult Somewhat difficult Somewhat difficult       Data saved with a previous flowsheet row definition    The patient does not have a history of falls. I did complete a risk assessment for falls. A plan of care for falls was documented.   Past Medical History:  Past Medical History:  Diagnosis Date   Arthritis    Asthma    Cancer of kidney (HCC)    Pancreatitis     Surgical History:  Past Surgical History:  Procedure Laterality Date   ABDOMINAL HYSTERECTOMY     APPENDECTOMY     CHOLECYSTECTOMY     COLONOSCOPY WITH PROPOFOL  N/A 03/24/2021   Procedure: COLONOSCOPY WITH PROPOFOL ;  Surgeon: Unk Corinn Skiff, MD;  Location: [facility] ENDOSCOPY;  Service: Gastroenterology;  Laterality: N/A;    Medications:  Current Outpatient Medications on File Prior to Visit  Medication Sig   clobetasol  ointment (TEMOVATE ) 0.05 % Apply topically 2 (two) times a week.   folic acid (FOLVITE) 1 MG tablet Take 1 mg by mouth daily.   latanoprost (  XALATAN) 0.005 % ophthalmic solution SMARTSIG:1 In Eye(s) Every Night   losartan  (COZAAR ) 25 MG tablet Take 1 tablet (25 mg total) by mouth daily.   methotrexate (RHEUMATREX) 7.5 MG tablet Take 7.5 mg by mouth once a week. Caution Chemotherapy. Protect from light.   ondansetron  (ZOFRAN ) 4 MG tablet Take 1 tablet (4 mg total) by mouth every 8 (eight) hours as needed for nausea or vomiting.   timolol (TIMOPTIC) 0.5 % ophthalmic solution SMARTSIG:In Eye(s)   tolterodine  (DETROL  LA) 2 MG 24 hr capsule Take 1 capsule (2 mg total) by mouth daily.   Vitamin D , Ergocalciferol , (DRISDOL ) 1.25 MG (50000 UNIT) CAPS capsule Take 1 capsule (50,000 Units total) by mouth every 7 (seven) days.   HUMIRA, 2 PEN, 40 MG/0.4ML pen SMARTSIG:40 Milligram(s) SUB-Q Every 2 Weeks   No current facility-administered  medications on file prior to visit.    Allergies:  Allergies  Allergen Reactions   Amoxicillin  Nausea Only    Social History:  Social History   Socioeconomic History   Marital status: Married    Spouse name: Not on file   Number of children: 3   Years of education: Not on file   Highest education level: Some college, no degree  Occupational History   Occupation: retired  Tobacco Use   Smoking status: Never    Passive exposure: Past   Smokeless tobacco: Never  Vaping Use   Vaping status: Never Used  Substance and Sexual Activity   Alcohol use: Not Currently   Drug use: Never   Sexual activity: Not Currently    Birth control/protection: Surgical  Other Topics Concern   Not on file  Social History Narrative   Not on file   Social Drivers of Health   Financial Resource Strain: Low Risk  (07/31/2023)   Received from [facility]   Overall Financial Resource Strain (CARDIA)    Difficulty of Paying Living Expenses: Not very hard  Food Insecurity: No Food Insecurity (07/31/2023)   Received from [facility]   Hunger Vital Sign    Within the past 12 months, you worried that your food would run out before you got the money to buy more.: Never true    Within the past 12 months, the food you bought just didn't last and you didn't have money to get more.: Never true  Transportation Needs: No Transportation Needs (07/31/2023)   Received from [facility] - Transportation    In the past 12 months, has lack of transportation kept you from medical appointments or from getting medications?: No    Lack of Transportation (Non-Medical): No  Physical Activity: Not on file  Stress: Not on file  Social Connections: Not on file  Intimate Partner Violence: Not on file   Social History   Tobacco Use  Smoking Status Never   Passive exposure: Past  Smokeless Tobacco Never   Social History   Substance and Sexual Activity   Alcohol Use Not Currently    Family History:  Family History  Problem Relation Age of Onset   Heart disease Mother    Heart disease Father    Cancer Brother    Breast cancer Maternal Aunt 22   Cancer Son    Depression Son     Past medical history, surgical history, medications, allergies, family history and social history reviewed with patient today and changes made to appropriate areas of the chart.   Review of Systems  Eyes:  Negative for blurred vision and double vision.  Respiratory:  Positive for cough. Negative for shortness of breath.   Cardiovascular:  Negative for chest pain, palpitations and leg swelling.  Neurological:  Negative for dizziness and headaches.   All other ROS negative except what is listed above and in the HPI.      Objective:    BP (!) 154/65 (BP Location: Right Arm, Cuff Size: Normal)   Pulse (!) 56   Temp 97.8 F (36.6 C) (Oral)   Ht 5' 3.78 (1.62 m)   Wt 137 lb 3.2 oz (62.2 kg)   SpO2 99%   BMI 23.71 kg/m   Wt Readings from Last 3 Encounters:  05/02/24 137 lb 3.2 oz (62.2 kg)  02/07/24 151 lb 6.4 oz (68.7 kg)  10/31/23 150 lb (68 kg)    Physical Exam Vitals and nursing note reviewed.  Constitutional:      General: She is awake. She is not in acute distress.    Appearance: She is well-developed. She is not ill-appearing.  HENT:     Head: Normocephalic and atraumatic.     Right Ear: Hearing, tympanic membrane, ear canal and external ear normal. No drainage.     Left Ear: Hearing, tympanic membrane, ear canal and external ear normal. No drainage.     Nose: Nose normal.     Right Sinus: No maxillary sinus tenderness or frontal sinus tenderness.     Left Sinus: No maxillary sinus tenderness or frontal sinus tenderness.     Mouth/Throat:     Mouth: Mucous membranes are moist.     Pharynx: Oropharynx is clear. Uvula midline. No pharyngeal swelling, oropharyngeal exudate or posterior oropharyngeal erythema.  Eyes:     General: Lids are  normal.        Right eye: No discharge.        Left eye: No discharge.     Extraocular Movements: Extraocular movements intact.     Conjunctiva/sclera: Conjunctivae normal.     Pupils: Pupils are equal, round, and reactive to light.     Visual Fields: Right eye visual fields normal and left eye visual fields normal.  Neck:     Thyroid: No thyromegaly.     Vascular: No carotid bruit.     Trachea: Trachea normal.  Cardiovascular:     Rate and Rhythm: Normal rate and regular rhythm.     Heart sounds: Normal heart sounds. No murmur heard.    No gallop.  Pulmonary:     Effort: Pulmonary effort is normal. No accessory muscle usage or respiratory distress.     Breath sounds: Normal breath sounds.  Chest:  Breasts:    Right: Normal.     Left: Normal.  Abdominal:     General: Bowel sounds are normal.     Palpations: Abdomen is soft. There is no hepatomegaly or splenomegaly.     Tenderness: There is no abdominal tenderness.  Musculoskeletal:        General: Normal range of motion.     Cervical back: Normal range of motion and neck supple.     Right lower leg: No edema.     Left lower leg: No edema.  Lymphadenopathy:     Head:     Right side of head: No submental, submandibular, tonsillar, preauricular or posterior auricular adenopathy.     Left side of head: No submental, submandibular, tonsillar, preauricular or posterior auricular adenopathy.     Cervical: No cervical adenopathy.     Upper  Body:     Right upper body: No supraclavicular, axillary or pectoral adenopathy.     Left upper body: No supraclavicular, axillary or pectoral adenopathy.  Skin:    General: Skin is warm and dry.     Capillary Refill: Capillary refill takes less than 2 seconds.     Findings: No rash.  Neurological:     Mental Status: She is alert and oriented to person, place, and time.     Gait: Gait is intact.  Psychiatric:        Attention and Perception: Attention normal.        Mood and Affect: Mood  normal.        Speech: Speech normal.        Behavior: Behavior normal. Behavior is cooperative.        Thought Content: Thought content normal.        Judgment: Judgment normal.     Results for orders placed or performed in visit on 02/07/24  Comp Met (CMET)   Collection Time: 02/07/24 11:36 AM  Result Value Ref Range   Glucose 114 (H) 70 - 99 mg/dL   BUN 25 8 - 27 mg/dL   Creatinine, Ser 8.52 (H) 0.57 - 1.00 mg/dL   eGFR 37 (L) >40 fO/fpw/8.26   BUN/Creatinine Ratio 17 12 - 28   Sodium 138 134 - 144 mmol/L   Potassium 5.6 (H) 3.5 - 5.2 mmol/L   Chloride 102 96 - 106 mmol/L   CO2 20 20 - 29 mmol/L   Calcium 9.5 8.7 - 10.3 mg/dL   Total Protein 7.8 6.0 - 8.5 g/dL   Albumin 4.1 3.8 - 4.8 g/dL   Globulin, Total 3.7 1.5 - 4.5 g/dL   Bilirubin Total 0.3 0.0 - 1.2 mg/dL   Alkaline Phosphatase 89 44 - 121 IU/L   AST 26 0 - 40 IU/L   ALT 13 0 - 32 IU/L  Anemia Profile B   Collection Time: 02/07/24 11:36 AM  Result Value Ref Range   Total Iron Binding Capacity 228 (L) 250 - 450 ug/dL   UIBC 841 881 - 630 ug/dL   Iron 70 27 - 860 ug/dL   Iron Saturation 31 15 - 55 %   Ferritin 365 (H) 15 - 150 ng/mL   Vitamin B-12 392 232 - 1,245 pg/mL   Folate >20.0 >3.0 ng/mL   WBC 10.3 3.4 - 10.8 x10E3/uL   RBC 4.45 3.77 - 5.28 x10E6/uL   Hemoglobin 13.5 11.1 - 15.9 g/dL   Hematocrit 58.1 65.9 - 46.6 %   MCV 94 79 - 97 fL   MCH 30.3 26.6 - 33.0 pg   MCHC 32.3 31.5 - 35.7 g/dL   RDW 86.6 88.2 - 84.5 %   Platelets 365 150 - 450 x10E3/uL   Neutrophils 69 Not Estab. %   Lymphs 15 Not Estab. %   Monocytes 8 Not Estab. %   Eos 7 Not Estab. %   Basos 1 Not Estab. %   Neutrophils Absolute 7.1 (H) 1.4 - 7.0 x10E3/uL   Lymphocytes Absolute 1.6 0.7 - 3.1 x10E3/uL   Monocytes Absolute 0.9 0.1 - 0.9 x10E3/uL   EOS (ABSOLUTE) 0.7 (H) 0.0 - 0.4 x10E3/uL   Basophils Absolute 0.1 0.0 - 0.2 x10E3/uL   Immature Granulocytes 0 Not Estab. %   Immature Grans (Abs) 0.0 0.0 - 0.1 x10E3/uL   Retic Ct Pct  1.4 0.6 - 2.6 %  TSH   Collection Time: 02/07/24 11:36 AM  Result  Value Ref Range   TSH 0.533 0.450 - 4.500 uIU/mL  T4, free   Collection Time: 02/07/24 11:36 AM  Result Value Ref Range   Free T4 1.42 0.82 - 1.77 ng/dL      Assessment & Plan:   Problem List Items Addressed This Visit       Cardiovascular and Mediastinum   Hypertension   Chronic.  Elevated at visit today.  Will check at home and bring log to visit.  Will increase Losartan  at that time if needed.  Las ordered.  Follow up in 1 month.        Genitourinary   CKD (chronic kidney disease), stage III (HCC)   Chronic.  Controlled.  Continue with current medication regimen.  Need to get blood pressure down.  Followed by Dr. Marcelino.  Labs ordered today.  Return to clinic in 6 months for reevaluation.  Call sooner if concerns arise.          Other   Need for influenza vaccination   Relevant Orders   Flu vaccine HIGH DOSE PF(Fluzone Trivalent) (Completed)   Hyperlipidemia   Chronic.  Controlled.  Continue with current medication regimen.  Return to clinic in 6 months for reevaluation.  Call sooner if concerns arise.       Relevant Orders   Lipid panel   Other Visit Diagnoses       Annual physical exam    -  Primary   Health maintenance reviewed during visit today.  Labs ordered.  vaccines reviewed.   Relevant Orders   CBC with Differential/Platelet   Comprehensive metabolic panel with GFR   Lipid panel   TSH     Subacute cough       Recommend zyrtec and flonase to see if symptoms improve.  FOllow up in 1 month.        Follow up plan: Return in about 1 month (around 06/02/2024) for BP Check.   LABORATORY TESTING:  - Pap smear: not applicable  IMMUNIZATIONS:   - Tdap: Tetanus vaccination status reviewed: requesting vaccine records. - Influenza: Administered today - Pneumovax:  requesting vaccine records. - Prevnar:  requesting vaccine records. - COVID: Will get at next visit - HPV: Not applicable -  Shingrix vaccine:  requesting vaccine records.  SCREENING: -Mammogram: Not applicable  - Colonoscopy: Not applicable  - Bone Density: Up to date  -Hearing Test: Not applicable  -Spirometry: Not applicable   PATIENT COUNSELING:   Advised to take 1 mg of folate supplement per day if capable of pregnancy.   Sexuality: Discussed sexually transmitted diseases, partner selection, use of condoms, avoidance of unintended pregnancy  and contraceptive alternatives.   Advised to avoid cigarette smoking.  I discussed with the patient that most people either abstain from alcohol or drink within safe limits (<=14/week and <=4 drinks/occasion for males, <=7/weeks and <= 3 drinks/occasion for females) and that the risk for alcohol disorders and other health effects rises proportionally with the number of drinks per week and how often a drinker exceeds daily limits.  Discussed cessation/primary prevention of drug use and availability of treatment for abuse.   Diet: Encouraged to adjust caloric intake to maintain  or achieve ideal body weight, to reduce intake of dietary saturated fat and total fat, to limit sodium intake by avoiding high sodium foods and not adding table salt, and to maintain adequate dietary potassium and calcium preferably from fresh fruits, vegetables, and low-fat dairy products.    stressed the importance of regular  exercise  Injury prevention: Discussed safety belts, safety helmets, smoke detector, smoking near bedding or upholstery.   Dental health: Discussed importance of regular tooth brushing, flossing, and dental visits.    NEXT PREVENTATIVE PHYSICAL DUE IN 1 YEAR. Return in about 1 month (around 06/02/2024) for BP Check.

## 2024-05-02 NOTE — Assessment & Plan Note (Addendum)
 Chronic.  Elevated at visit today.  Will check at home and bring log to visit.  Will increase Losartan  at that time if needed.  Las ordered.  Follow up in 1 month.

## 2024-05-03 ENCOUNTER — Other Ambulatory Visit: Payer: Self-pay | Admitting: Nurse Practitioner

## 2024-05-03 ENCOUNTER — Ambulatory Visit: Payer: Self-pay | Admitting: Nurse Practitioner

## 2024-05-03 LAB — CBC WITH DIFFERENTIAL/PLATELET
Basophils Absolute: 0.1 x10E3/uL (ref 0.0–0.2)
Basos: 1 %
EOS (ABSOLUTE): 0.5 x10E3/uL — ABNORMAL HIGH (ref 0.0–0.4)
Eos: 6 %
Hematocrit: 40.2 % (ref 34.0–46.6)
Hemoglobin: 12.8 g/dL (ref 11.1–15.9)
Immature Grans (Abs): 0 x10E3/uL (ref 0.0–0.1)
Immature Granulocytes: 0 %
Lymphocytes Absolute: 2 x10E3/uL (ref 0.7–3.1)
Lymphs: 24 %
MCH: 30 pg (ref 26.6–33.0)
MCHC: 31.8 g/dL (ref 31.5–35.7)
MCV: 94 fL (ref 79–97)
Monocytes Absolute: 0.6 x10E3/uL (ref 0.1–0.9)
Monocytes: 8 %
Neutrophils Absolute: 5.2 x10E3/uL (ref 1.4–7.0)
Neutrophils: 61 %
Platelets: 346 x10E3/uL (ref 150–450)
RBC: 4.26 x10E6/uL (ref 3.77–5.28)
RDW: 14.5 % (ref 11.7–15.4)
WBC: 8.4 x10E3/uL (ref 3.4–10.8)

## 2024-05-03 LAB — LIPID PANEL
Chol/HDL Ratio: 3.3 ratio (ref 0.0–4.4)
Cholesterol, Total: 173 mg/dL (ref 100–199)
HDL: 52 mg/dL (ref >39)
LDL Chol Calc (NIH): 101 mg/dL — ABNORMAL HIGH (ref 0–99)
Triglycerides: 110 mg/dL (ref 0–149)
VLDL Cholesterol Cal: 20 mg/dL (ref 5–40)

## 2024-05-03 LAB — COMPREHENSIVE METABOLIC PANEL WITH GFR
ALT: 12 IU/L (ref 0–32)
AST: 21 IU/L (ref 0–40)
Albumin: 3.9 g/dL (ref 3.8–4.8)
Alkaline Phosphatase: 77 IU/L (ref 49–135)
BUN/Creatinine Ratio: 14 (ref 12–28)
BUN: 20 mg/dL (ref 8–27)
Bilirubin Total: 0.3 mg/dL (ref 0.0–1.2)
CO2: 21 mmol/L (ref 20–29)
Calcium: 9.8 mg/dL (ref 8.7–10.3)
Chloride: 100 mmol/L (ref 96–106)
Creatinine, Ser: 1.39 mg/dL — ABNORMAL HIGH (ref 0.57–1.00)
Globulin, Total: 3.6 g/dL (ref 1.5–4.5)
Glucose: 88 mg/dL (ref 70–99)
Potassium: 5.2 mmol/L (ref 3.5–5.2)
Sodium: 136 mmol/L (ref 134–144)
Total Protein: 7.5 g/dL (ref 6.0–8.5)
eGFR: 39 mL/min/1.73 — ABNORMAL LOW (ref >59)

## 2024-05-03 LAB — TSH: TSH: 0.807 u[IU]/mL (ref 0.450–4.500)

## 2024-05-06 NOTE — Telephone Encounter (Signed)
 Requested Prescriptions  Pending Prescriptions Disp Refills   losartan  (COZAAR ) 25 MG tablet [Pharmacy Med Name: LOSARTAN  POTASSIUM 25 MG TAB] 90 tablet 0    Sig: TAKE 1 TABLET (25 MG TOTAL) BY MOUTH DAILY.     Cardiovascular:  Angiotensin Receptor Blockers Failed - 05/06/2024  1:45 PM      Failed - Cr in normal range and within 180 days    Creatinine, Ser  Date Value Ref Range Status  05/02/2024 1.39 (H) 0.57 - 1.00 mg/dL Final         Failed - Last BP in normal range    BP Readings from Last 1 Encounters:  05/02/24 (!) 154/65         Passed - K in normal range and within 180 days    Potassium  Date Value Ref Range Status  05/02/2024 5.2 3.5 - 5.2 mmol/L Final         Passed - Patient is not pregnant      Passed - Valid encounter within last 6 months    Recent Outpatient Visits           4 days ago Annual physical exam   [place] [facility] Melvin Pao, NP   2 months ago Shortness of breath   [place] [facility] Melvin Pao, NP   6 months ago Severe episode of recurrent major depressive disorder, without psychotic features [facility])   [place] [facility] Melvin Pao, NP

## 2024-05-30 ENCOUNTER — Other Ambulatory Visit: Payer: Self-pay | Admitting: Nurse Practitioner

## 2024-06-05 ENCOUNTER — Ambulatory Visit: Admitting: Nurse Practitioner

## 2024-06-05 VITALS — BP 128/79 | HR 58 | Temp 97.5°F | Ht 63.78 in | Wt 133.4 lb

## 2024-06-05 DIAGNOSIS — R634 Abnormal weight loss: Secondary | ICD-10-CM

## 2024-06-05 DIAGNOSIS — I10 Essential (primary) hypertension: Secondary | ICD-10-CM

## 2024-06-05 NOTE — Assessment & Plan Note (Signed)
 Ongoing concern. Suspect patient is not taking in enough calories due to caring for her husband.  Encouraged her to eat 3 meals a day that are high in protein and add protein shakes to increase calories. Follow up in 3 months.

## 2024-06-05 NOTE — Progress Notes (Signed)
 "  BP 128/79 (BP Location: Left Arm, Patient Position: Sitting, Cuff Size: Normal)   Pulse (!) 58   Temp (!) 97.5 F (36.4 C) (Oral)   Ht 5' 3.78 (1.62 m)   Wt 133 lb 6.4 oz (60.5 kg)   SpO2 99%   BMI 23.06 kg/m    Subjective:    Patient ID: Virginia West, female    DOB: [birth year], 79 y.o.   MRN: [id number]  HPI: Virginia West is a 79 y.o. female  Chief Complaint  Patient presents with   office visit    1 month F/u. BP check. Patient is still concerned about her weightloss.   HYPERTENSION with Chronic Kidney Disease Hypertension status: controlled  Satisfied with current treatment? no Duration of hypertension: chronic BP monitoring frequency:  daily BP range: 120/80s BP medication side effects:  no Medication compliance: excellent compliance Previous BP meds:losartan  (cozaar ) Aspirin: no Recurrent headaches: no Visual changes: no Palpitations: no Dyspnea: no Chest pain: no Lower extremity edema: no Dizzy/lightheaded: no  Patient states she has lost 4lbs in 1 month.  She has lost about 20lbs in about 4 months.  She isn't trying to lose weight.  Her husband takes a lot of her time.  She hasn't been eating much lately due to him being in rehab.  She eats 1-2 times per day and does snack.  However, right now she is just snacking throughout the day.   Relevant past medical, surgical, family and social history reviewed and updated as indicated. Interim medical history since our last visit reviewed. Allergies and medications reviewed and updated.  Review of Systems  Constitutional:  Positive for unexpected weight change.  Eyes:  Negative for visual disturbance.  Respiratory:  Negative for cough, chest tightness and shortness of breath.   Cardiovascular:  Negative for chest pain, palpitations and leg swelling.  Neurological:  Negative for dizziness and headaches.    Per HPI unless specifically indicated above     Objective:    BP 128/79 (BP Location: Left Arm,  Patient Position: Sitting, Cuff Size: Normal)   Pulse (!) 58   Temp (!) 97.5 F (36.4 C) (Oral)   Ht 5' 3.78 (1.62 m)   Wt 133 lb 6.4 oz (60.5 kg)   SpO2 99%   BMI 23.06 kg/m   Wt Readings from Last 3 Encounters:  06/05/24 133 lb 6.4 oz (60.5 kg)  05/02/24 137 lb 3.2 oz (62.2 kg)  02/07/24 151 lb 6.4 oz (68.7 kg)    Physical Exam Vitals and nursing note reviewed.  Constitutional:      General: She is not in acute distress.    Appearance: Normal appearance. She is normal weight. She is not ill-appearing, toxic-appearing or diaphoretic.  HENT:     Head: Normocephalic.     Right Ear: External ear normal.     Left Ear: External ear normal.     Nose: Nose normal.     Mouth/Throat:     Mouth: Mucous membranes are moist.     Pharynx: Oropharynx is clear.  Eyes:     General:        Right eye: No discharge.        Left eye: No discharge.     Extraocular Movements: Extraocular movements intact.     Conjunctiva/sclera: Conjunctivae normal.     Pupils: Pupils are equal, round, and reactive to light.  Cardiovascular:     Rate and Rhythm: Normal rate and regular rhythm.     Heart sounds: No  murmur heard. Pulmonary:     Effort: Pulmonary effort is normal. No respiratory distress.     Breath sounds: Normal breath sounds. No wheezing or rales.  Musculoskeletal:     Cervical back: Normal range of motion and neck supple.  Skin:    General: Skin is warm and dry.     Capillary Refill: Capillary refill takes less than 2 seconds.  Neurological:     General: No focal deficit present.     Mental Status: She is alert and oriented to person, place, and time. Mental status is at baseline.  Psychiatric:        Mood and Affect: Mood normal.        Behavior: Behavior normal.        Thought Content: Thought content normal.        Judgment: Judgment normal.     Results for orders placed or performed in visit on 05/02/24  CBC with Differential/Platelet   Collection Time: 05/02/24 10:32 AM   Result Value Ref Range   WBC 8.4 3.4 - 10.8 x10E3/uL   RBC 4.26 3.77 - 5.28 x10E6/uL   Hemoglobin 12.8 11.1 - 15.9 g/dL   Hematocrit 59.7 65.9 - 46.6 %   MCV 94 79 - 97 fL   MCH 30.0 26.6 - 33.0 pg   MCHC 31.8 31.5 - 35.7 g/dL   RDW 85.4 88.2 - 84.5 %   Platelets 346 150 - 450 x10E3/uL   Neutrophils 61 Not Estab. %   Lymphs 24 Not Estab. %   Monocytes 8 Not Estab. %   Eos 6 Not Estab. %   Basos 1 Not Estab. %   Neutrophils Absolute 5.2 1.4 - 7.0 x10E3/uL   Lymphocytes Absolute 2.0 0.7 - 3.1 x10E3/uL   Monocytes Absolute 0.6 0.1 - 0.9 x10E3/uL   EOS (ABSOLUTE) 0.5 (H) 0.0 - 0.4 x10E3/uL   Basophils Absolute 0.1 0.0 - 0.2 x10E3/uL   Immature Granulocytes 0 Not Estab. %   Immature Grans (Abs) 0.0 0.0 - 0.1 x10E3/uL  Comprehensive metabolic panel with GFR   Collection Time: 05/02/24 10:32 AM  Result Value Ref Range   Glucose 88 70 - 99 mg/dL   BUN 20 8 - 27 mg/dL   Creatinine, Ser 8.60 (H) 0.57 - 1.00 mg/dL   eGFR 39 (L) >40 fO/fpw/8.26   BUN/Creatinine Ratio 14 12 - 28   Sodium 136 134 - 144 mmol/L   Potassium 5.2 3.5 - 5.2 mmol/L   Chloride 100 96 - 106 mmol/L   CO2 21 20 - 29 mmol/L   Calcium 9.8 8.7 - 10.3 mg/dL   Total Protein 7.5 6.0 - 8.5 g/dL   Albumin 3.9 3.8 - 4.8 g/dL   Globulin, Total 3.6 1.5 - 4.5 g/dL   Bilirubin Total 0.3 0.0 - 1.2 mg/dL   Alkaline Phosphatase 77 49 - 135 IU/L   AST 21 0 - 40 IU/L   ALT 12 0 - 32 IU/L  Lipid panel   Collection Time: 05/02/24 10:32 AM  Result Value Ref Range   Cholesterol, Total 173 100 - 199 mg/dL   Triglycerides 889 0 - 149 mg/dL   HDL 52 >60 mg/dL   VLDL Cholesterol Cal 20 5 - 40 mg/dL   LDL Chol Calc (NIH) 898 (H) 0 - 99 mg/dL   Chol/HDL Ratio 3.3 0.0 - 4.4 ratio  TSH   Collection Time: 05/02/24 10:32 AM  Result Value Ref Range   TSH 0.807 0.450 - 4.500 uIU/mL  Assessment & Plan:   Problem List Items Addressed This Visit       Cardiovascular and Mediastinum   Hypertension   Chronic.  Controlled.   Continue with current medication regimen.  Return to clinic in 6 months for reevaluation.  Call sooner if concerns arise.          Other   Weight loss - Primary   Ongoing concern. Suspect patient is not taking in enough calories due to caring for her husband.  Encouraged her to eat 3 meals a day that are high in protein and add protein shakes to increase calories. Follow up in 3 months.          Follow up plan: Return in about 3 months (around 09/03/2024) for Weight Managment.      "

## 2024-06-05 NOTE — Patient Instructions (Signed)
"  Salicylic acid   "

## 2024-06-05 NOTE — Assessment & Plan Note (Signed)
Chronic.  Controlled.  Continue with current medication regimen.  Return to clinic in 6 months for reevaluation.  Call sooner if concerns arise.  ? ?

## 2024-06-19 ENCOUNTER — Telehealth: Payer: Self-pay

## 2024-06-19 NOTE — Telephone Encounter (Signed)
 Patient due for annual. Nothing scheduled as of now.

## 2024-06-19 NOTE — Telephone Encounter (Signed)
 Virginia West

## 2024-09-03 ENCOUNTER — Ambulatory Visit: Admitting: Nurse Practitioner

## 2024-09-04 ENCOUNTER — Ambulatory Visit: Admitting: Nurse Practitioner
